# Patient Record
Sex: Female | Born: 1960 | Race: Black or African American | Hispanic: No | Smoking: Former smoker
Health system: Southern US, Community
[De-identification: ages and names within clinical notes are randomized; demographics above are authoritative.]

## PROBLEM LIST (undated history)

## (undated) DIAGNOSIS — I10 Essential (primary) hypertension: Secondary | ICD-10-CM

## (undated) DIAGNOSIS — K219 Gastro-esophageal reflux disease without esophagitis: Secondary | ICD-10-CM

## (undated) DIAGNOSIS — I509 Heart failure, unspecified: Secondary | ICD-10-CM

## (undated) DIAGNOSIS — G4730 Sleep apnea, unspecified: Secondary | ICD-10-CM

## (undated) DIAGNOSIS — F329 Major depressive disorder, single episode, unspecified: Secondary | ICD-10-CM

## (undated) DIAGNOSIS — J45909 Unspecified asthma, uncomplicated: Secondary | ICD-10-CM

## (undated) DIAGNOSIS — I890 Lymphedema, not elsewhere classified: Secondary | ICD-10-CM

## (undated) DIAGNOSIS — F32A Depression, unspecified: Secondary | ICD-10-CM

## (undated) HISTORY — DX: Unspecified asthma, uncomplicated: J45.909

## (undated) HISTORY — DX: Heart failure, unspecified: I50.9

---

## 2004-12-12 ENCOUNTER — Emergency Department: Payer: Self-pay | Admitting: Emergency Medicine

## 2004-12-13 ENCOUNTER — Other Ambulatory Visit: Payer: Self-pay

## 2004-12-14 ENCOUNTER — Emergency Department: Payer: Self-pay | Admitting: General Practice

## 2004-12-20 ENCOUNTER — Emergency Department (HOSPITAL_COMMUNITY): Admission: EM | Admit: 2004-12-20 | Discharge: 2004-12-20 | Payer: Self-pay | Admitting: Emergency Medicine

## 2008-06-14 ENCOUNTER — Inpatient Hospital Stay: Payer: Self-pay | Admitting: Internal Medicine

## 2011-08-20 ENCOUNTER — Inpatient Hospital Stay: Payer: Self-pay | Admitting: Internal Medicine

## 2013-02-12 ENCOUNTER — Inpatient Hospital Stay: Payer: Self-pay | Admitting: Internal Medicine

## 2013-02-12 LAB — COMPREHENSIVE METABOLIC PANEL
Albumin: 2.4 g/dL — ABNORMAL LOW (ref 3.4–5.0)
BUN: 8 mg/dL (ref 7–18)
Calcium, Total: 8.2 mg/dL — ABNORMAL LOW (ref 8.5–10.1)
Co2: 26 mmol/L (ref 21–32)
Creatinine: 0.7 mg/dL (ref 0.60–1.30)
EGFR (African American): >60
EGFR (Non-African Amer.): >60
Glucose: 109 mg/dL — ABNORMAL HIGH (ref 65–99)
Osmolality: 269 (ref 275–301)
Potassium: 4 mmol/L (ref 3.5–5.1)
SGOT(AST): 18 U/L (ref 15–37)
Sodium: 135 mmol/L — ABNORMAL LOW (ref 136–145)
Total Protein: 7.5 g/dL (ref 6.4–8.2)

## 2013-02-12 LAB — CBC
HCT: 35.2 % (ref 35.0–47.0)
HGB: 11.7 g/dL — ABNORMAL LOW (ref 12.0–16.0)
MCH: 33.1 pg (ref 26.0–34.0)
MCV: 100 fL (ref 80–100)
Platelet: 241 10*3/uL (ref 150–440)
RBC: 3.53 10*6/uL — ABNORMAL LOW (ref 3.80–5.20)
RDW: 15 % — ABNORMAL HIGH (ref 11.5–14.5)
WBC: 15.5 10*3/uL — ABNORMAL HIGH (ref 3.6–11.0)

## 2013-02-12 LAB — URINALYSIS, COMPLETE
Bilirubin,UR: NEGATIVE
Blood: NEGATIVE
Ketone: NEGATIVE
Ph: 7 (ref 4.5–8.0)
Protein: NEGATIVE
Squamous Epithelial: 3
WBC UR: 38 /HPF (ref 0–5)

## 2013-02-12 LAB — PRO B NATRIURETIC PEPTIDE: B-Type Natriuretic Peptide: 254 pg/mL — ABNORMAL HIGH (ref 0–125)

## 2013-02-12 LAB — TROPONIN I: Troponin-I: <0.02 ng/mL

## 2013-02-13 LAB — BASIC METABOLIC PANEL
Anion Gap: 7 (ref 7–16)
BUN: 9 mg/dL (ref 7–18)
Calcium, Total: 8.4 mg/dL — ABNORMAL LOW (ref 8.5–10.1)
EGFR (African American): >60
EGFR (Non-African Amer.): >60
Glucose: 100 mg/dL — ABNORMAL HIGH (ref 65–99)
Osmolality: 271 (ref 275–301)
Potassium: 3.6 mmol/L (ref 3.5–5.1)

## 2013-02-13 LAB — CBC WITH DIFFERENTIAL/PLATELET
Basophil #: 0.1 10*3/uL (ref 0.0–0.1)
Eosinophil %: 1.4 %
Lymphocyte %: 15.4 %
MCHC: 33.8 g/dL (ref 32.0–36.0)
Monocyte #: 1.4 x10 3/mm — ABNORMAL HIGH (ref 0.2–0.9)
Neutrophil #: 8 10*3/uL — ABNORMAL HIGH (ref 1.4–6.5)
Neutrophil %: 70.4 %
RBC: 3.36 10*6/uL — ABNORMAL LOW (ref 3.80–5.20)
RDW: 15.1 % — ABNORMAL HIGH (ref 11.5–14.5)
WBC: 11.4 10*3/uL — ABNORMAL HIGH (ref 3.6–11.0)

## 2013-02-13 LAB — CK-MB
CK-MB: <0.5 ng/mL — ABNORMAL LOW (ref 0.5–3.6)
CK-MB: <0.5 ng/mL — ABNORMAL LOW (ref 0.5–3.6)

## 2013-02-13 LAB — TROPONIN I: Troponin-I: <0.02 ng/mL

## 2013-02-14 LAB — VANCOMYCIN, TROUGH: Vancomycin, Trough: 10 ug/mL (ref 10–20)

## 2013-02-14 LAB — URINE CULTURE

## 2013-02-16 LAB — CREATININE, SERUM
Creatinine: 1.01 mg/dL (ref 0.60–1.30)
EGFR (Non-African Amer.): >60

## 2013-02-17 LAB — CULTURE, BLOOD (SINGLE)

## 2013-02-17 LAB — CREATININE, SERUM
EGFR (African American): >60
EGFR (Non-African Amer.): >60

## 2013-03-02 ENCOUNTER — Encounter: Payer: Self-pay | Admitting: Cardiothoracic Surgery

## 2013-03-02 ENCOUNTER — Encounter: Payer: Self-pay | Admitting: Nurse Practitioner

## 2013-03-22 ENCOUNTER — Encounter: Payer: Self-pay | Admitting: Cardiothoracic Surgery

## 2013-03-22 ENCOUNTER — Encounter: Payer: Self-pay | Admitting: Nurse Practitioner

## 2013-04-21 ENCOUNTER — Encounter: Payer: Self-pay | Admitting: Cardiothoracic Surgery

## 2013-04-21 ENCOUNTER — Encounter: Payer: Self-pay | Admitting: Nurse Practitioner

## 2014-04-15 IMAGING — CR DG CHEST 1V PORT
1 series · 1 of 1 positions shown · non-contrast
Comparison: none

REASON FOR EXAM: sepsis
COMMENTS:

[ap]
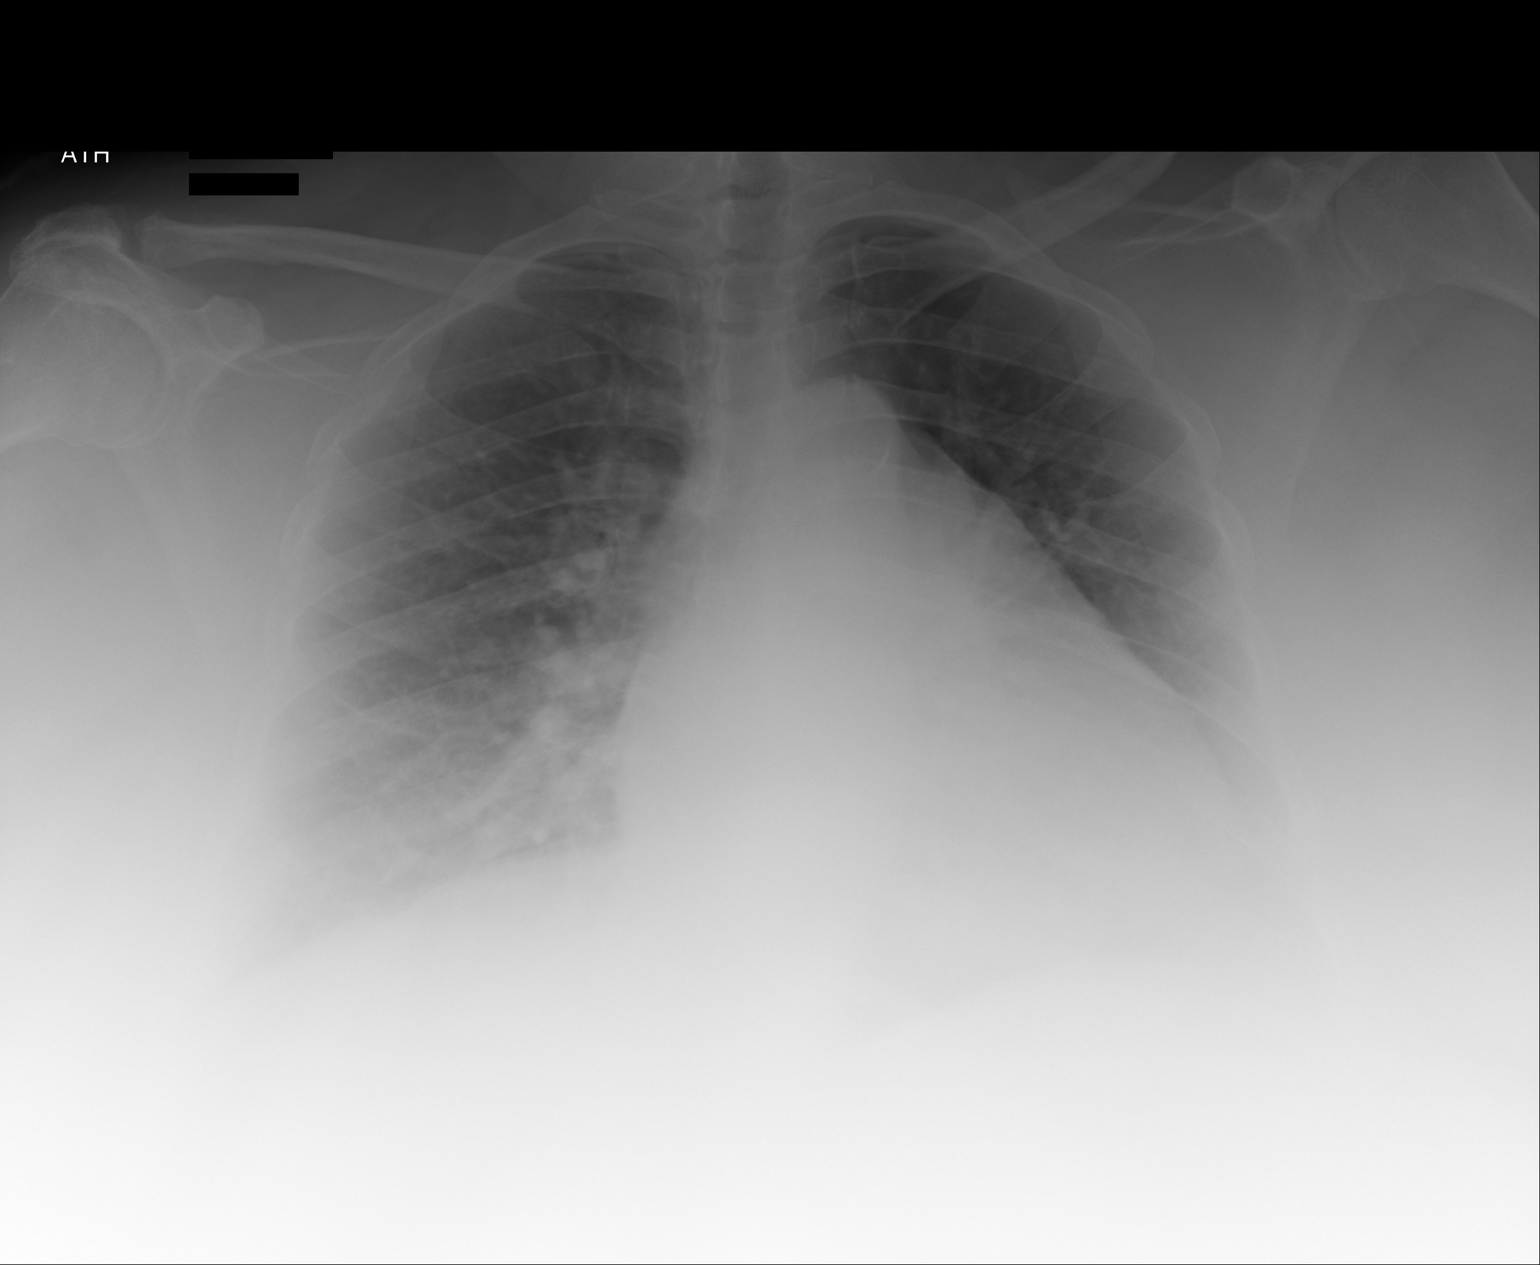

[1 of 1 positions shown; findings below may reference images not displayed]

PROCEDURE:     DXR - DXR PORTABLE CHEST SINGLE VIEW  - February 12, 2013 [DATE]

RESULT:     Comparison is made to the study August 20, 2011.

The lungs are reasonably well inflated. The cardiac silhouette is enlarged.
The interstitial markings are mildly increased and the central pulmonary
vascularity is prominent. Overlying soft tissues limit the diagnostic
quality of the study.
IMPRESSION: The findings suggest low-grade CHF. No focal pneumonia is
demonstrated. A followup PA and lateral chest x-ray would be of value when
the patient can tolerate the procedure.

[REDACTED]

## 2014-04-18 IMAGING — CR DG CHEST 1V
1 series · 1 of 1 positions shown · non-contrast
Comparison: none

REASON FOR EXAM: sob
COMMENTS:

PROCEDURE:     DXR - DXR CHEST 1 VIEWAP OR PA  - February 15, 2013  [DATE]
RESULT:     Comparison is made to the study February 12, 2013.
The lungs are reasonably well inflated. The cardiac silhouette is enlarged.
The pulmonary vascularity is prominent centrally. There is no pleural
effusion.

[x chest ap]
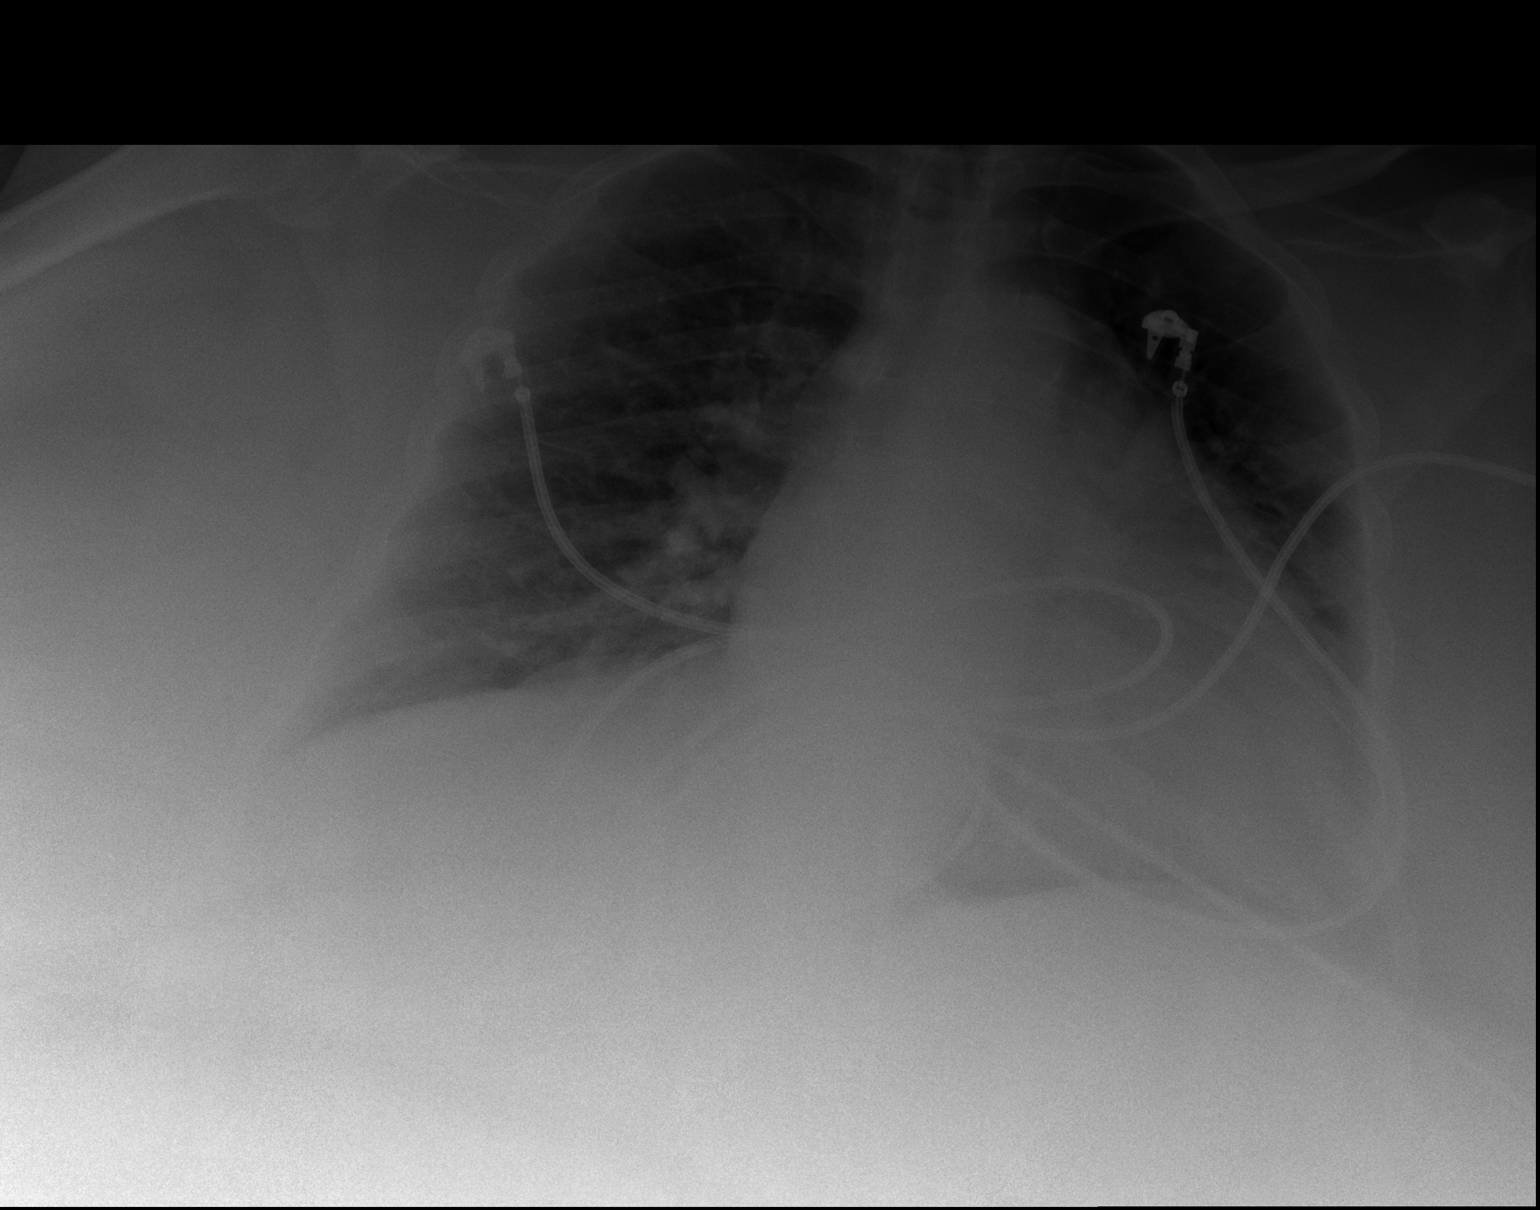

[1 of 1 positions shown; findings below may reference images not displayed]

IMPRESSION: The findings are consistent with low-grade CHF. There is no
evidence of pneumonia.

[REDACTED]

## 2015-04-13 NOTE — H&P (Signed)
PATIENT NAME:  [name], HEINER MR#:  [id number] DATE OF BIRTH:  06/24/1961  DATE OF ADMISSION:  02/12/2013  PRIMARY CARE PHYSICIAN: At [facility].   HISTORY OF PRESENT ILLNESS: The patient is a 54 year old African American female morbidly obese with history of cellulitis in the lower extremity, has severe lymphedema involving both legs, has hypertension, obstructive sleep apnea, which she is not using a CPAP machine for the past few years, who presents with complaint of having redness involving her left leg knee below. She also noticed some worsening swelling of the leg as well. She has had chills and has felt feverish. In the ED, she was noted to have erythema and warmth, consistent with cellulitis. The patient also reports that she has been having some left-sided sharp pain, which started yesterday. She also has had shortness of breath on and off, but for the past few days, she has been more short of breath. She otherwise denies any abdominal pain, nausea, vomiting or diarrhea. Denies any urinary frequency, urgency or hesitancy.   PAST MEDICAL HISTORY: Significant for:  1.  History of admission in 2009 for cellulitis.  2.  History of having acute renal failure secondary to Ancef.  3.  History of admission in August 2012 for pulmonary edema. At that time, echo was 55%, thought to have diastolic CHF.  4.  History of anemia with elevated MCV.  5.  Morbid obesity.  6.  Hypertension.  7.  Chronic lymphedema.  8.  Obstructive sleep apnea, not using CPAP for the past few years.  9.  History of panic attacks.   HOME MEDICATIONS: 1.  50 one tab p.o. b.i.d.  2.  Catapres-TTS-2 patch daily.  3.  Chlorthalidone 25 p.o. daily.  4.  Lasix 40 p.o. daily.  5.  Omeprazole; she says she is on 40 daily.   ALLERGIES: None.   PAST SURGICAL HISTORY: None.   SOCIAL HISTORY: Used to smoke. Reports that she is not smoking anymore. Used to drink heavy on weekends but does not drink anymore.   FAMILY  HISTORY: Significant for hypertension and CVA in her father, who passed away last year according to her.   REVIEW OF SYSTEMS:  CONSTITUTIONAL: Complains of fever, fatigue, weakness. No weight loss.  EYES: No blurred or double vision. No pain. No redness. No inflammation. No glaucoma. Wears glasses.  ENT: No tinnitus. No ear pain. No hearing loss. No seasonal or year-round allergies. No difficulty swallowing.  RESPIRATORY: Complains of shortness of breath. No cough or wheezing. No hemoptysis. No history of COPD.  CARDIOVASCULAR: Complains of left-sided sharp chest pain that started yesterday. Denies orthopnea. Complains of lower extremity edema. History of diastolic CHF in the past. Denies any palpitations or syncope.  GASTROINTESTINAL: Denies any nausea, vomiting, diarrhea. No abdominal pain. No hematemesis. No melena. No changes in bowel habits.  GENITOURINARY: Denies any dysuria, hematuria.  Denies any frequency or urgency ENDOCRINE: Denies any polyuria, nocturia or thyroid problems.  HEMATOLOGIC/LYMPHATIC: Has a history of anemia. No easy bruisability or bleeding.  SKIN: No acne. Has chronic lymphedema of the lower extremities.  MUSCULOSKELETAL: Denies any pain in the neck, back or shoulder.  NEUROLOGIC: No numbness. No CVA. No TIA. No seizures.  PSYCHIATRIC: No anxiety. No insomnia. No ADD. No OCD.   PHYSICAL EXAMINATION: VITAL SIGNS: Temperature 100.2, pulse 80, respirations 22, blood pressure 154/113.  Subsequent check was 168/65. O2 100% on room air.  GENERAL: The patient is a morbidly obese African American female currently not  in any acute distress.  HEENT: Head atraumatic, normocephalic. Pupils equally round and reactive to light and accommodation. There is no conjunctival pallor. No scleral icterus. Nasal exam shows no drainage or ulceration. Oropharynx is clear without any exudates.  NECK: There is no thyromegaly. No carotid bruits.  CARDIOVASCULAR: Regular rate and rhythm. No  murmurs, rubs, clicks or gallops. PMI is not displaced.  LUNGS: Clear to auscultation bilaterally without any rales, rhonchi, wheezing.  ABDOMEN: Obese, nontender, nondistended. Positive bowel sounds x4. No hepatosplenomegaly.  EXTREMITIES: She has got chronic lymphedema involving both lower extremities. There is warmth and erythema involving the left leg knee down.  SKIN: Beside erythematous changes and chronic lymphedema changes in the lower extremity, no other rashes identified.  LYMPHATICS: No lymph nodes palpable.  VASCULAR: Hard to appreciate pulses in the lower extremity due to her weight, but there are DP and PT pulses.  PSYCHIATRIC: Not anxious or depressed.  NEUROLOGICAL: Awake, alert, oriented x3. No focal deficits.   EVALUATION IN THE EMERGENCY DEPARTMENT: BMP: Glucose was 109, BUN 8, creatinine was 0.70, sodium 135, potassium 4.0, chloride 103, CO2 was 26, calcium 8.2, total protein 7.5, albumin of 2.4, bili total 0.7, alk phos was 82, AST 18, troponin less than 0.02. WBC count is 15.5, hemoglobin 11.7, platelet count 241. Lactic acid 0.5. Chest x-ray shows findings suggestive of low-grade CHF. No focal pneumonia is demonstrated.   ASSESSMENT AND PLAN: The patient is a 54 year old morbidly obese female, who presents with left leg swelling and erythema for the past few days. Also has reproducible chest pain and shortness of breath.  1. Left leg cellulitis. At this time, we will treat her with IV vancomycin and Unasyn, and blood cultures have been already ordered.  2.  Chest pain. We will check cardiac enzymes and EKG. Her chest x-ray suggests low-grade congestive heart failure. We will place her on aspirin. We will check a D-dimer; however, this is likely going to be elevated due to her cellulitis and her chronic lymphedema, which will not help [name]. Her pain is reproducible. Due to her morbid obesity, we will check a lower extremity Doppler to make sure she does not have a deep venous  thrombosis. I will hold off on full anticoagulation for the time being.  3.  Hypertension. Continue atenolol and Catapres patch. I will put her on hydralazine p.r.n.  4.  Chronic lymphedema. She will be on IV Lasix due to possible acute diastolic CHF.  5.  Shortness of breath, likely due to acute diastolic CHF. We will place her on IV Lasix. She had an echo done last year, so we will not repeat that.  6.  Miscellaneous. We will place her on Lovenox for DVT prophylaxis.   TIME SPENT: Forty-five minutes.    ____________________________ Lafonda Mosses Posey Pronto, MD shp:lg D: 02/12/2013 12:47:38 ET T: 02/12/2013 15:51:15 ET JOB#: [id number]  cc: Garcia Dalzell H. Posey Pronto, MD, <Dictator> Alric Seton MD ELECTRONICALLY SIGNED 02/14/2013 12:26

## 2015-04-13 NOTE — Discharge Summary (Signed)
PATIENT NAME:  [name], [name] MR#:  [id number] DATE OF BIRTH:  Apr 26, 1961  DATE OF ADMISSION:  02/12/2013 DATE OF DISCHARGE:  02/17/2013  PRIMARY CARE PHYSICIAN: Gildardo Cranker, MD   DISCHARGE DIAGNOSES: 1.  Left leg cellulitis. 2.  Chronic lymphedema of the legs. 3.  Hypertension. 4.  Morbid obesity.  5.  Sleep apnea.  6.  History of panic attacks.   DISCHARGE MEDICATIONS: 1.  Catapres patch 0.1 mg for 24 hours 1 patch every week.  2.  Chlorthalidone 25 mg daily.  3.  Lasix 40 mg daily. 4.  Omeprazole 40 mg daily. 5.  Percocet 5/325 mg every 4 to 6 hours as needed for pain.  6.  Atenolol 50 mg p.o. b.i.d.  7.  AmLactin, which is ammonium lactate, topical cream apply to the left leg twice daily.  8.  Bactrim DS 1 tablet p.o. b.i.d. for 2 weeks.   CONSULTATIONS: Wound Care.  HOSPITAL COURSE:  1. The patient is a 54 year old morbidly obese female with chronic lymphedema and hypertension admitted because of worsening swelling and redness and pain in the left leg. The patient felt chills and feverish.  The patient admitted for leg cellulitis, started on vancomycin. Temperature was 100.2 on admission. She also had chest pressure when she came in. EKG was normal. Troponins have been negative. The patient's blood cultures were negative. She improved with vancomycin. She still has some pain and redness, but better than when she came. She was seen by wound care people, recommended AmLactin cream for her legs. She will be going home with Bactrim 1 tablet daily for 2 weeks and follow up with wound care people in 1 week. She also will follow up with her primary doctor at [facility].  2. Hypertension. The patient was started on Lasix that she was taking at home along with clonidine patch and Lasix dose increased, initially 40 mg IV every 12 hours, to help with her leg swelling. The patient complained of staying up all night with increased urine output. Then Lasix changed.  Right now she is on Lasix 40  mg IV daily that she can continue for 40 mg p.o. daily.  3.  Elevated d-dimer. D-dimer elevated up to 6.  The patient could not fit in CAT scan of the chest because of her weight. V/Q scan was done which showed low probability for PE. The patient initially continued on Lovenox, full dose, but that is stopped now. The patient has CPAP machine at home. She said she is going to have her primary doctor arrange home health. She denies any home health needs here. Blood pressure today is 117/76, pulse 75, respirations 18 and  temperature 98.4. 4.  UTI. Urine culture showed E. coli from admission that is sensitive to cefazolin and ceftriaxone, resistant to Cipro and sensitive to Bactrim. So initially IV vancomycin was given, but now I changed to Bactrim, which is sensitive for urine infection, also leg cellulitis. The patient also had ultrasound of the leg which did not show any occlusive thrombus. The patient's white count on admission was elevated at 15.5, but it came down to 11.4 on 23rd of February.  She is afebrile since admission.   CONDITION ON DISCHARGE: Stable. She will follow up with her primary doctor in 1 to 2 weeks.   TIME SPENT ON DISCHARGE PREPARATION: More than 30 minutes.  ____________________________ Katha Hamming, MD sk:sb D: 02/17/2013 08:45:25 ET T: 02/17/2013 09:18:24 ET JOB#: [id number]  cc: Katha Hamming, MD, <Dictator> Amy  Tyler DeisWheeler, MD [facility] Katha HammingSNEHALATHA Nakima Fluegge MD ELECTRONICALLY SIGNED 02/23/2013 14:48

## 2015-11-01 ENCOUNTER — Emergency Department: Payer: Medicaid Other

## 2015-11-01 ENCOUNTER — Inpatient Hospital Stay
Admission: EM | Admit: 2015-11-01 | Discharge: 2015-11-06 | DRG: 602 | Disposition: A | Discharge status: Home Health | Payer: Medicaid Other | Attending: Internal Medicine | Admitting: Internal Medicine

## 2015-11-01 ENCOUNTER — Encounter: Payer: Self-pay | Admitting: *Deleted

## 2015-11-01 DIAGNOSIS — N61 Mastitis without abscess: Secondary | ICD-10-CM | POA: Diagnosis present

## 2015-11-01 DIAGNOSIS — Z79899 Other long term (current) drug therapy: Secondary | ICD-10-CM

## 2015-11-01 DIAGNOSIS — Z823 Family history of stroke: Secondary | ICD-10-CM

## 2015-11-01 DIAGNOSIS — N3000 Acute cystitis without hematuria: Secondary | ICD-10-CM | POA: Diagnosis present

## 2015-11-01 DIAGNOSIS — L0390 Cellulitis, unspecified: Secondary | ICD-10-CM | POA: Diagnosis present

## 2015-11-01 DIAGNOSIS — G4730 Sleep apnea, unspecified: Secondary | ICD-10-CM | POA: Diagnosis present

## 2015-11-01 DIAGNOSIS — K219 Gastro-esophageal reflux disease without esophagitis: Secondary | ICD-10-CM | POA: Diagnosis present

## 2015-11-01 DIAGNOSIS — J9601 Acute respiratory failure with hypoxia: Secondary | ICD-10-CM | POA: Diagnosis present

## 2015-11-01 DIAGNOSIS — L03313 Cellulitis of chest wall: Secondary | ICD-10-CM

## 2015-11-01 DIAGNOSIS — I110 Hypertensive heart disease with heart failure: Secondary | ICD-10-CM | POA: Diagnosis present

## 2015-11-01 DIAGNOSIS — Z8249 Family history of ischemic heart disease and other diseases of the circulatory system: Secondary | ICD-10-CM

## 2015-11-01 DIAGNOSIS — B9620 Unspecified Escherichia coli [E. coli] as the cause of diseases classified elsewhere: Secondary | ICD-10-CM | POA: Diagnosis present

## 2015-11-01 DIAGNOSIS — I5031 Acute diastolic (congestive) heart failure: Secondary | ICD-10-CM | POA: Diagnosis present

## 2015-11-01 DIAGNOSIS — L03114 Cellulitis of left upper limb: Principal | ICD-10-CM | POA: Diagnosis present

## 2015-11-01 DIAGNOSIS — J984 Other disorders of lung: Secondary | ICD-10-CM | POA: Diagnosis present

## 2015-11-01 DIAGNOSIS — B372 Candidiasis of skin and nail: Secondary | ICD-10-CM | POA: Diagnosis present

## 2015-11-01 DIAGNOSIS — Z6841 Body Mass Index (BMI) 40.0 and over, adult: Secondary | ICD-10-CM

## 2015-11-01 DIAGNOSIS — I5033 Acute on chronic diastolic (congestive) heart failure: Secondary | ICD-10-CM

## 2015-11-01 HISTORY — DX: Lymphedema, not elsewhere classified: I89.0

## 2015-11-01 HISTORY — DX: Sleep apnea, unspecified: G47.30

## 2015-11-01 HISTORY — DX: Major depressive disorder, single episode, unspecified: F32.9

## 2015-11-01 HISTORY — DX: Essential (primary) hypertension: I10

## 2015-11-01 HISTORY — DX: Morbid (severe) obesity due to excess calories: E66.01

## 2015-11-01 HISTORY — DX: Gastro-esophageal reflux disease without esophagitis: K21.9

## 2015-11-01 HISTORY — DX: Depression, unspecified: F32.A

## 2015-11-01 LAB — LACTIC ACID, PLASMA
LACTIC ACID, VENOUS: 1.2 mmol/L (ref 0.5–2.0)
LACTIC ACID, VENOUS: 0.8 mmol/L (ref 0.5–2.0)

## 2015-11-01 LAB — URINALYSIS COMPLETE WITH MICROSCOPIC (ARMC ONLY)
BILIRUBIN URINE: NEGATIVE
GLUCOSE, UA: NEGATIVE mg/dL
Hgb urine dipstick: NEGATIVE
KETONES UR: NEGATIVE mg/dL
NITRITE: POSITIVE — AB
Protein, ur: 30 mg/dL — AB
SPECIFIC GRAVITY, URINE: 1.021 (ref 1.005–1.030)
pH: 6 (ref 5.0–8.0)

## 2015-11-01 LAB — CBC
HCT: 45.4 % (ref 35.0–47.0)
Hemoglobin: 14.7 g/dL (ref 12.0–16.0)
MCH: 36.7 pg — ABNORMAL HIGH (ref 26.0–34.0)
MCHC: 32.4 g/dL (ref 32.0–36.0)
MCV: 113.2 fL — ABNORMAL HIGH (ref 80.0–100.0)
PLATELETS: 210 10*3/uL (ref 150–440)
RBC: 4.01 MIL/uL (ref 3.80–5.20)
RDW: 16.3 % — ABNORMAL HIGH (ref 11.5–14.5)
WBC: 6.2 10*3/uL (ref 3.6–11.0)

## 2015-11-01 LAB — BASIC METABOLIC PANEL
ANION GAP: 8 (ref 5–15)
BUN: 8 mg/dL (ref 6–20)
CALCIUM: 8.8 mg/dL — AB (ref 8.9–10.3)
CO2: 33 mmol/L — ABNORMAL HIGH (ref 22–32)
CREATININE: 0.59 mg/dL (ref 0.44–1.00)
Chloride: 98 mmol/L — ABNORMAL LOW (ref 101–111)
Glucose, Bld: 107 mg/dL — ABNORMAL HIGH (ref 65–99)
Potassium: 4 mmol/L (ref 3.5–5.1)
SODIUM: 139 mmol/L (ref 135–145)

## 2015-11-01 MED ORDER — ENOXAPARIN SODIUM 40 MG/0.4ML ~~LOC~~ SOLN
40.0000 mg | Freq: Two times a day (BID) | SUBCUTANEOUS | Status: DC
  Administered 2015-11-01 – 2015-11-06 (×11): 40 mg via SUBCUTANEOUS
  Filled 2015-11-01 (×12): qty 0.4

## 2015-11-01 MED ORDER — NYSTATIN-TRIAMCINOLONE 100000-0.1 UNIT/GM-% EX OINT
TOPICAL_OINTMENT | Freq: Two times a day (BID) | CUTANEOUS | Status: DC
  Administered 2015-11-01 – 2015-11-06 (×11): via TOPICAL
  Filled 2015-11-01 (×2): qty 15

## 2015-11-01 MED ORDER — PIPERACILLIN-TAZOBACTAM 4.5 G IVPB
4.5000 g | Freq: Three times a day (TID) | INTRAVENOUS | Status: DC
  Filled 2015-11-01: qty 100

## 2015-11-01 MED ORDER — DOCUSATE SODIUM 100 MG PO CAPS
100.0000 mg | ORAL_CAPSULE | Freq: Two times a day (BID) | ORAL | Status: DC
  Administered 2015-11-01 – 2015-11-03 (×3): 100 mg via ORAL
  Filled 2015-11-01 (×10): qty 1

## 2015-11-01 MED ORDER — ACETAMINOPHEN 325 MG PO TABS: 650.0000 mg | ORAL_TABLET | Freq: Four times a day (QID) | ORAL | Status: DC | PRN

## 2015-11-01 MED ORDER — SODIUM CHLORIDE 0.9 % IV SOLN
1750.0000 mg | Freq: Two times a day (BID) | INTRAVENOUS | Status: DC
  Administered 2015-11-02 – 2015-11-03 (×3): 1750 mg via INTRAVENOUS
  Filled 2015-11-01 (×4): qty 1750

## 2015-11-01 MED ORDER — CHLORTHALIDONE 25 MG PO TABS
25.0000 mg | ORAL_TABLET | Freq: Every day | ORAL | Status: DC
  Administered 2015-11-01 – 2015-11-05 (×5): 25 mg via ORAL
  Filled 2015-11-01 (×5): qty 1

## 2015-11-01 MED ORDER — LISINOPRIL 10 MG PO TABS
10.0000 mg | ORAL_TABLET | Freq: Every day | ORAL | Status: DC
  Administered 2015-11-01 – 2015-11-05 (×5): 10 mg via ORAL
  Filled 2015-11-01 (×5): qty 1

## 2015-11-01 MED ORDER — ACETAMINOPHEN 650 MG RE SUPP: 650.0000 mg | Freq: Four times a day (QID) | RECTAL | Status: DC | PRN

## 2015-11-01 MED ORDER — INFLUENZA VAC SPLIT QUAD 0.5 ML IM SUSY
0.5000 mL | PREFILLED_SYRINGE | INTRAMUSCULAR | Status: AC
  Administered 2015-11-02: 0.5 mL via INTRAMUSCULAR
  Filled 2015-11-01: qty 0.5

## 2015-11-01 MED ORDER — PIPERACILLIN-TAZOBACTAM 3.375 G IVPB
3.3750 g | Freq: Once | INTRAVENOUS | Status: AC
  Administered 2015-11-01: 3.375 g via INTRAVENOUS
  Filled 2015-11-01: qty 50

## 2015-11-01 MED ORDER — SODIUM CHLORIDE 0.9 % IV SOLN
1750.0000 mg | INTRAVENOUS | Status: AC
  Administered 2015-11-01: 1750 mg via INTRAVENOUS
  Filled 2015-11-01: qty 1750

## 2015-11-01 MED ORDER — POLYETHYLENE GLYCOL 3350 17 G PO PACK
17.0000 g | PACK | Freq: Every day | ORAL | Status: DC | PRN
  Administered 2015-11-03 – 2015-11-05 (×2): 17 g via ORAL
  Filled 2015-11-01 (×2): qty 1

## 2015-11-01 MED ORDER — ATENOLOL 50 MG PO TABS
50.0000 mg | ORAL_TABLET | Freq: Two times a day (BID) | ORAL | Status: DC
  Administered 2015-11-01 – 2015-11-06 (×11): 50 mg via ORAL
  Filled 2015-11-01 (×11): qty 1

## 2015-11-01 MED ORDER — PIPERACILLIN-TAZOBACTAM 4.5 G IVPB
4.5000 g | Freq: Three times a day (TID) | INTRAVENOUS | Status: DC
  Administered 2015-11-01 – 2015-11-03 (×6): 4.5 g via INTRAVENOUS
  Filled 2015-11-01 (×7): qty 100

## 2015-11-01 MED ORDER — FUROSEMIDE 40 MG PO TABS
40.0000 mg | ORAL_TABLET | Freq: Two times a day (BID) | ORAL | Status: DC
  Administered 2015-11-01 – 2015-11-06 (×10): 40 mg via ORAL
  Filled 2015-11-01 (×10): qty 1

## 2015-11-01 MED ORDER — CLONIDINE HCL 0.2 MG/24HR TD PTWK
0.2000 mg | MEDICATED_PATCH | TRANSDERMAL | Status: DC
  Administered 2015-11-01: 0.2 mg via TRANSDERMAL
  Filled 2015-11-01: qty 1

## 2015-11-01 NOTE — Evaluation (Deleted)
Physical Therapy Evaluation Patient Details Name: [name] MRN: [id number] DOB: [birth year] Today's Date: 11/01/2015   History of Present Illness  Pt is a 54 yo female who was admitted to the hospital with UTI and cellulitis to the L breast and arm.  Clinical Impression  Pt presents with hx of HTN, morbid obesity, GERD, depression, and sleep apnea. Examination that pt performs bed mobility at mod A, transfers at min A, and is not able to complete ambulation secondary to pain. Her bed mobility requires the most A by far. Pt appears labored with breathing during all mobility, but her SaO2 never falls below 98%. Secondary to her decreased mobility and decreased functional strength she will continue to benefit from skilled PT in order to return to premorbid state. At this point pt is well removed from baseline. She is fairly pleasant and willing to work with therapy.     Follow Up Recommendations SNF    Equipment Recommendations   (Bariatric bedside commode )    Recommendations for Other Services       Precautions / Restrictions Precautions Precautions: Fall Restrictions Weight Bearing Restrictions: No      Mobility  Bed Mobility Overal bed mobility: Needs Assistance Bed Mobility: Supine to Sit;Sit to Supine     Supine to sit: Mod assist;+2 for physical assistance Sit to supine: Mod assist;+2 for physical assistance   General bed mobility comments: Pt needs trunk and LE support for general bed mobility. Needs assist to get back to [facility]. Pt at risk for rolling off of bed  Transfers Overall transfer level: Needs assistance Equipment used:  (BRW) Transfers: Sit to/from Stand Sit to Stand: Min assist         General transfer comment: Pt with good functional strength but still requiring min assist to get into standing. Pt states that she feels pretty good in standing with no LOB  Ambulation/Gait Ambulation/Gait assistance:  (Not appropriate this date secondary to  pain.)              Stairs            Wheelchair Mobility    Modified Rankin (Stroke Patients Only)       Balance Overall balance assessment: No apparent balance deficits (not formally assessed)                                           Pertinent Vitals/Pain Pain Assessment: No/denies pain    Home Living Family/patient expects to be discharged to:: Private residence Living Arrangements: Alone Available Help at Discharge: Family;Available PRN/intermittently Type of Home: Apartment Home Access: Level entry     Home Layout: One level Home Equipment:  (BRW)      Prior Function Level of Independence: Independent with assistive device(s)         Comments: Pt was indep with BRW and mostly household ambulation. Family members do her shopping for her     Hand Dominance        Extremity/Trunk Assessment   Upper Extremity Assessment: Overall WFL for tasks assessed           Lower Extremity Assessment: Overall WFL for tasks assessed         Communication   Communication: No difficulties  Cognition Arousal/Alertness: Awake/alert Behavior During Therapy: WFL for tasks assessed/performed Overall Cognitive Status: Within Functional Limits for tasks assessed  General Comments      Exercises Other Exercises Other Exercises: Pt was taught and performed bilateral therex x 10 reps at supervision for proper technique. Exercises included: ankle pumps, quad sets, hip abd, and arm reach overhead. Exercises frequency and volume placed on board for pt      Assessment/Plan    PT Assessment Patient needs continued PT services  PT Diagnosis Difficulty walking;Acute pain   PT Problem List Decreased range of motion;Decreased activity tolerance;Decreased mobility;Decreased knowledge of use of DME;Obesity;Pain  PT Treatment Interventions DME instruction;Gait training;Stair training;Functional mobility  training;Therapeutic activities;Balance training;Therapeutic exercise;Neuromuscular re-education;Cognitive remediation   PT Goals (Current goals can be found in the Care Plan section) Acute Rehab PT Goals Patient Stated Goal: to return home PT Goal Formulation: With patient Time For Goal Achievement: 11/15/15 Potential to Achieve Goals: Fair    Frequency Min 2X/week   Barriers to discharge Decreased caregiver support      Co-evaluation               End of Session Equipment Utilized During Treatment: Gait belt (2 gait belts required) Activity Tolerance: Patient tolerated treatment well;Patient limited by pain Patient left: in bed;with call bell/phone within reach;with bed alarm set Nurse Communication: Mobility status         Time: 1429-1500 PT Time Calculation (min) (ACUTE ONLY): 31 min   Charges:         PT G CodesBenna Dunks:        [name] 11/01/2015, 4:23 PM Benna Dunksasey Tranika Scholler, SPT. [phone]

## 2015-11-01 NOTE — H&P (Signed)
[facility] - [place] at [facility]   PATIENT NAME: [name]    MR#:  [id number]  DATE OF BIRTH:  03-25-1961  DATE OF ADMISSION:  11/01/2015  PRIMARY CARE PHYSICIAN: No primary care provider on file.   REQUESTING/REFERRING PHYSICIAN: Dr. Lacretia Nicks  CHIEF COMPLAINT:   Chief Complaint  Patient presents with  . Fatigue    HISTORY OF PRESENT ILLNESS:  [name]  is a 54 y.o. female with a known history of morbid obesity, gastroesophageal reflux disease, hypertension depression and chronic bilateral lower extremity edema presents to hospital secondary to generalized weakness, worsening left breast swelling, tenderness and erythema. Patient is supposed to be on Lasix 80 mg daily and also chlorthalidone for her chronic edema, however hasn't been taking her medication from last month. She says, it is getting hard for her to get to bathroom in time due to taking her Diuretics. She has been working with her primary care physician to get herself a bedside commode. Until then she wanted to stop those medications. She noticed that the swelling in her legs and also her left arm and left breast have worsened, or the last couple of days she has increased redness and tenderness noted as well. Denies any fevers or chills. She had upper respiratory tract infection almost 6 weeks ago, since then her cough has become less frequent but her weakness has persisted. No nausea or vomiting. Has noticed some pain and irritation when voiding. Labs indicate significant urinary tract infection, and also on exam she has cellulitis of the chest wall, left breast and left arm.  PAST MEDICAL HISTORY:   Past Medical History  Diagnosis Date  . Hypertension   . Sleep apnea   . Morbid obesity (HCC)   . GERD (gastroesophageal reflux disease)   . Depression   . Lymphedema     PAST SURGICAL HISTORY:  History reviewed. No pertinent past surgical history.  SOCIAL HISTORY:   Social  History  Substance Use Topics  . Smoking status: Never Smoker   . Smokeless tobacco: Not on file  . Alcohol Use: 0.0 oz/week    0 Standard drinks or equivalent per week     Comment: occasional alcohol use    FAMILY HISTORY:   Family History  Problem Relation Age of Onset  . CVA Father   . Hypertension Father   . Hypertension Mother     DRUG ALLERGIES:  No Known Allergies  REVIEW OF SYSTEMS:   Review of Systems  Constitutional: Negative for fever, chills and weight loss.  HENT: Negative for ear discharge, ear pain, nosebleeds and tinnitus.   Eyes: Negative for blurred vision, double vision and photophobia.       Uses glasses  Respiratory: Positive for cough. Negative for hemoptysis, shortness of breath and wheezing.   Cardiovascular: Positive for leg swelling. Negative for chest pain, palpitations and orthopnea.  Gastrointestinal: Negative for nausea, vomiting, abdominal pain, diarrhea, constipation and melena.  Genitourinary: Positive for dysuria and frequency. Negative for urgency and hematuria.  Musculoskeletal: Positive for myalgias, back pain and joint pain. Negative for neck pain.  Skin: Positive for itching and rash.  Neurological: Positive for weakness. Negative for dizziness, sensory change, speech change, focal weakness and headaches.  Endo/Heme/Allergies: Does not bruise/bleed easily.  Psychiatric/Behavioral: Negative for depression. The patient is not nervous/anxious.     MEDICATIONS AT HOME:   Prior to Admission medications   Medication Sig Start Date End Date Taking? Authorizing Provider  atenolol (TENORMIN) 50  MG tablet Take 50 mg by mouth 2 (two) times daily.   Yes Historical Provider, MD  cloNIDine (CATAPRES-TTS-2) 0.2 mg/24hr patch Place 0.2 mg onto the skin once a week. On Thursday   Yes Historical Provider, MD  ketoconazole (NIZORAL) 2 % cream Apply 1 application topically daily.   Yes Historical Provider, MD  lisinopril (PRINIVIL,ZESTRIL) 10 MG tablet  Take 10 mg by mouth daily.   Yes Historical Provider, MD      VITAL SIGNS:  Blood pressure 135/86, pulse 72, temperature 99.2 F (37.3 C), temperature source Oral, resp. rate 22, height 5\' 2"  (1.575 m), weight 208.655 kg (460 lb), SpO2 99 %.  PHYSICAL EXAMINATION:   Physical Exam  GENERAL:  54 y.o.-year-old morbidly obese patient sitting in the bed with no acute distress.  EYES: Pupils equal, round, reactive to light and accommodation. Uses glasses. No scleral icterus. Extraocular muscles intact.  HEENT: Head atraumatic, normocephalic. Oropharynx and nasopharynx clear.  NECK:  Supple, no jugular venous distention. No thyroid enlargement, no tenderness.  LUNGS: Normal breath sounds bilaterally, no wheezing, rales,rhonchi or crepitation. Scant breath sounds are audible due to body habitus. Decreased bibasilar breath sounds. No use of accessory muscles of respiration.  Breasts- enlarged and heavy left breast with significant subcutaneous edema. Underneath both breasts, patient has likely candidal infection. Some erythema and tenderness also spreads onto superior abdominal wall. CARDIOVASCULAR: S1, S2 normal. No murmurs, rubs, or gallops.  ABDOMEN: Very obese abdomen, Soft, nontender. Bowel sounds present. No organomegaly or mass.  EXTREMITIES: Chronic bilateral lymphedema, skin of left leg is more thickened and discolored due to previous history of cellulitis. Some cellulitis with redness also extends onto left arm which she is more swollen than the right one. No cyanosis, or clubbing.  NEUROLOGIC: Cranial nerves II through XII are intact. Muscle strength 4/5 in all extremities with generalized weakness. Sensation intact. Gait not checked.  PSYCHIATRIC: The patient is alert and oriented x 3.  SKIN: Thickened and discolored skin posterior  left leg , in no early left breast is warm and tender, also some erythema of left upper arm  LABORATORY PANEL:   CBC  Recent Labs Lab 11/01/15 0908   WBC 6.2  HGB 14.7  HCT 45.4  PLT 210   ------------------------------------------------------------------------------------------------------------------  Chemistries   Recent Labs Lab 11/01/15 0908  NA 139  K 4.0  CL 98*  CO2 33*  GLUCOSE 107*  BUN 8  CREATININE 0.59  CALCIUM 8.8*   ------------------------------------------------------------------------------------------------------------------  Cardiac Enzymes No results for input(s): TROPONINI in the last 168 hours. ------------------------------------------------------------------------------------------------------------------  RADIOLOGY:  Dg Chest 2 View  11/01/2015  CLINICAL DATA:  Intermittent cough and congestion. Left breast swelling and redness. EXAM: CHEST  2 VIEW COMPARISON:  February 15, 2013 FINDINGS: There is interstitial edema with cardiomegaly and pulmonary venous hypertension. No airspace consolidation. No adenopathy appreciable. No bone lesions. No visualized soft tissue lesions. IMPRESSION: Evidence of a degree of congestive heart failure. No airspace consolidation. Electronically Signed   By: Bretta BangWilliam  Woodruff III M.D.   On: 11/01/2015 08:54    EKG:   Orders placed or performed during the hospital encounter of 11/01/15  . EKG 12-Lead  . EKG 12-Lead  . ED EKG  . ED EKG    IMPRESSION AND PLAN:   Bailey ShiverLinda Huerta  is a 54 y.o. female with a known history of morbid obesity, gastroesophageal reflux disease, hypertension depression and chronic bilateral lower extremity edema presents to hospital secondary to generalized weakness, worsening left breast swelling,  tenderness and erythema.  #1 cellulitis of left breast and left arm-keep the left breast elevated, Lasix started to decrease the swelling. -Blood cultures are pending. -On broad-spectrum antibiotics with vancomycin and Zosyn at this time  #2 acute cystitis-urine and blood cultures ordered. -Patient is on broad-spectrum antibiotics anyways for her  cellulitis  #3 candidal infection of the skin-poor hygiene in general. -Started on nystatin ointment twice a day  #4 hypertension-well controlled. Continue atenolol, chlorthalidone, clonidine, Lasix and lisinopril  #5 chronic lower extremity edema-restart her Lasix and chlorthalidone here. -We will need a bedside commode at discharge  #6 DVT prophylaxis-started on Lovenox  Physical therapy consult requested    All the records are reviewed and case discussed with ED provider. Management plans discussed with the patient, family and they are in agreement.  CODE STATUS: Full Code  TOTAL TIME TAKING CARE OF THIS PATIENT: 50 minutes.    [name] M.D on 11/01/2015 at 11:53 AM  Between 7am to 6pm - Pager - [phone]  After 6pm go to [email or web address] - password [email or web address] [facility]  [facility] [place] [facility]  Office  [phone]  CC: Primary care physician; No primary care provider on file.

## 2015-11-01 NOTE — ED Notes (Signed)
Patient transported to X-ray 

## 2015-11-01 NOTE — Progress Notes (Addendum)
ANTIBIOTIC CONSULT NOTE - INITIAL  Pharmacy Consult for Zosyn/Vancomycin Indication: cellulitis  No Known Allergies  Patient Measurements: Height: 5\' 2"  (157.5 cm) Weight: (!) 460 lb (208.655 kg) IBW/kg (Calculated) : 50.1 Adjusted Body Weight: 113.5 kg   Vital Signs: Temp: 98.5 F (36.9 C) (11/10 1236) Temp Source: Oral (11/10 1236) BP: 155/78 mmHg (11/10 1236) Pulse Rate: 81 (11/10 1236) Intake/Output from previous day:   Intake/Output from this shift:    Labs:  Recent Labs  11/01/15 0908  WBC 6.2  HGB 14.7  PLT 210  CREATININE 0.59   Estimated Creatinine Clearance: 142.4 mL/min (by C-G formula based on Cr of 0.59). No results for input(s): VANCOTROUGH, VANCOPEAK, VANCORANDOM, GENTTROUGH, GENTPEAK, GENTRANDOM, TOBRATROUGH, TOBRAPEAK, TOBRARND, AMIKACINPEAK, AMIKACINTROU, AMIKACIN in the last 72 hours.   Microbiology: No results found for this or any previous visit (from the past 720 hour(s)).  Medical History: Past Medical History  Diagnosis Date  . Hypertension   . Sleep apnea   . Morbid obesity (HCC)   . GERD (gastroesophageal reflux disease)   . Depression   . Lymphedema     Medications:  Prescriptions prior to admission  Medication Sig Dispense Refill Last Dose  . atenolol (TENORMIN) 50 MG tablet Take 50 mg by mouth 2 (two) times daily.   10/31/2015 at 1230  . cloNIDine (CATAPRES-TTS-2) 0.2 mg/24hr patch Place 0.2 mg onto the skin once a week. On Thursday   Past Week at pm  . ketoconazole (NIZORAL) 2 % cream Apply 1 application topically daily.   10/26/2015 at pm  . lisinopril (PRINIVIL,ZESTRIL) 10 MG tablet Take 10 mg by mouth daily.   10/31/2015 at pm   Assessment: CrCl = 142.4 ml/min (~ 120 ml/min) Ke = 0.1 hr-1 T1/2 = 6.9 hrs Vd = 79.5 L    Goal of Therapy:  Vancomycin trough level 10-15 mcg/ml  Plan:  Expected duration 7 days with resolution of temperature and/or normalization of WBC   Zosyn 3.375 gm IV X 1 given on 11/10 @ 11:30. Zosyn  4.5 gm IV Q8H EI ordered to start 11/10 @ 19:00.  Vancomycin 1750 mg IV X 1 given on 11/10 @ 18:00. Vancomycin 1750 mg IV Q12H ordered to start 11/11 @ 4:00. This pt will reach Css by 10/12 @ 18:00. Will draw 1st trough on 11/12 @ 15:30, which will be at Css.   Curtez Brallier D 11/01/2015,4:42 PM

## 2015-11-01 NOTE — Evaluation (Deleted)
Physical Therapy Evaluation Patient Details Name: [name]. [name] MRN: [id number] DOB: 01/02/1961 Today's Date: 11/01/2015   History of Present Illness  Pt is a 54 yo female who was admitted to the hospital with UTI and cellulitis to the L breast and arm.  Clinical Impression  Pt presents with hx of HTN, morbid obesity, GERD, depression, and sleep apnea. Examination that pt performs bed mobility at mod A, transfers at min A, and is not able to complete ambulation secondary to pain. Her bed mobility requires the most A by far. Pt appears labored with breathing during all mobility, but her SaO2 never falls below 98%. Secondary to her decreased mobility and decreased functional strength she will continue to benefit from skilled PT in order to return to premorbid state. At this point pt is well removed from baseline. She is fairly pleasant and willing to work with therapy.     Follow Up Recommendations SNF    Equipment Recommendations  None recommended by PT    Recommendations for Other Services       Precautions / Restrictions Precautions Precautions: Fall Restrictions Weight Bearing Restrictions: No      Mobility  Bed Mobility Overal bed mobility: Needs Assistance Bed Mobility: Supine to Sit;Sit to Supine     Supine to sit: Mod assist;+2 for physical assistance Sit to supine: Mod assist;+2 for physical assistance   General bed mobility comments: Pt needs trunk and LE support for general bed mobility. Needs assist to get back to [facility]. Pt at risk for rolling off of bed  Transfers Overall transfer level: Needs assistance Equipment used:  (BRW) Transfers: Sit to/from Stand Sit to Stand: Min assist         General transfer comment: Pt with good functional strength but still requiring min assist to get into standing. Pt states that she feels pretty good in standing with no LOB  Ambulation/Gait Ambulation/Gait assistance:  (Not appropriate this date secondary to pain.)               Stairs            Wheelchair Mobility    Modified Rankin (Stroke Patients Only)       Balance Overall balance assessment: No apparent balance deficits (not formally assessed)                                           Pertinent Vitals/Pain Pain Assessment: No/denies pain    Home Living Family/patient expects to be discharged to:: Private residence Living Arrangements: Alone Available Help at Discharge: Family;Available PRN/intermittently Type of Home: Apartment Home Access: Level entry     Home Layout: One level Home Equipment:  (BRW)      Prior Function Level of Independence: Independent with assistive device(s)         Comments: Pt was indep with BRW and mostly household ambulation. Family members do her shopping for her     Hand Dominance        Extremity/Trunk Assessment   Upper Extremity Assessment: Overall WFL for tasks assessed           Lower Extremity Assessment: Overall WFL for tasks assessed         Communication   Communication: No difficulties  Cognition Arousal/Alertness: Awake/alert Behavior During Therapy: WFL for tasks assessed/performed Overall Cognitive Status: Within Functional Limits for tasks assessed  General Comments      Exercises Other Exercises Other Exercises: Pt was taught and performed bilateral therex x 10 reps at supervision for proper technique. Exercises included: ankle pumps, quad sets, hip abd, and arm reach overhead. Exercises frequency and volume placed on board for pt      Assessment/Plan    PT Assessment Patient needs continued PT services  PT Diagnosis Difficulty walking;Acute pain   PT Problem List Decreased range of motion;Decreased activity tolerance;Decreased mobility;Decreased knowledge of use of DME;Obesity;Pain  PT Treatment Interventions DME instruction;Gait training;Stair training;Functional mobility training;Therapeutic  activities;Balance training;Therapeutic exercise;Neuromuscular re-education;Cognitive remediation   PT Goals (Current goals can be found in the Care Plan section) Acute Rehab PT Goals Patient Stated Goal: to return home PT Goal Formulation: With patient Time For Goal Achievement: 11/15/15 Potential to Achieve Goals: Fair    Frequency Min 2X/week   Barriers to discharge Decreased caregiver support      Co-evaluation               End of Session Equipment Utilized During Treatment: Gait belt (2 gait belts required) Activity Tolerance: Patient tolerated treatment well;Patient limited by pain Patient left: in bed;with call bell/phone within reach;with bed alarm set Nurse Communication: Mobility status         Time: 1429-1500 PT Time Calculation (min) (ACUTE ONLY): 31 min   Charges:         PT G CodesBenna Dunks:        [name] 11/01/2015, 4:10 PM  Benna Dunksasey Thoams Siefert, SPT. [phone]

## 2015-11-01 NOTE — ED Notes (Signed)
Pt reports fatigue, generalized weakness over the last few months, pt reports weakness in her knees and edema to left breast and arm

## 2015-11-01 NOTE — Evaluation (Signed)
Physical Therapy Evaluation Patient Details Name: Bailey HurlLinda J. [name] MRN: [id number] DOB: 10/24/1961 Today's Date: 11/01/2015   History of Present Illness  Pt is a 54 yo female who was admitted to the hospital with UTI and cellulitis to the L breast and arm.  Clinical Impression  Pt presents with hx of HTN, morbid obesity, GERD, depression, and sleep apnea. Examination that pt performs bed mobility at mod A, transfers at min A, and is not able to complete ambulation secondary to pain. Her bed mobility requires the most A by far. Pt appears labored with breathing during all mobility, but her SaO2 never falls below 98%. Secondary to her decreased mobility and decreased functional strength she will continue to benefit from skilled PT in order to return to premorbid state. At this point pt is well removed from baseline. She is fairly pleasant and willing to work with therapy. This entire session was guided, instructed, and directly supervised by Elizabeth PalauStephanie Ray, DPT.     Follow Up Recommendations SNF    Equipment Recommendations   (Bariatric bedside commode )    Recommendations for Other Services       Precautions / Restrictions Precautions Precautions: Fall Restrictions Weight Bearing Restrictions: No      Mobility  Bed Mobility Overal bed mobility: Needs Assistance Bed Mobility: Supine to Sit;Sit to Supine     Supine to sit: Mod assist;+2 for physical assistance Sit to supine: Mod assist;+2 for physical assistance   General bed mobility comments: Pt needs trunk and LE support for general bed mobility. [facility]. Pt at risk for rolling off of bed  Transfers Overall transfer level: Needs assistance Equipment used:  (BRW) Transfers: Sit to/from Stand Sit to Stand: Min assist         General transfer comment: Pt with good functional strength but still requiring min assist to get into standing. Pt states that she feels pretty good in standing with no  LOB  Ambulation/Gait Ambulation/Gait assistance:  (Not appropriate this date secondary to pain.)              Stairs            Wheelchair Mobility    Modified Rankin (Stroke Patients Only)       Balance Overall balance assessment: No apparent balance deficits (not formally assessed)                                           Pertinent Vitals/Pain Pain Assessment: No/denies pain    Home Living Family/patient expects to be discharged to:: Private residence Living Arrangements: Alone Available Help at Discharge: Family;Available PRN/intermittently Type of Home: Apartment Home Access: Level entry     Home Layout: One level Home Equipment:  (BRW)      Prior Function Level of Independence: Independent with assistive device(s)         Comments: Pt was indep with BRW and mostly household ambulation. Family members do her shopping for her     Hand Dominance        Extremity/Trunk Assessment   Upper Extremity Assessment: Overall WFL for tasks assessed           Lower Extremity Assessment: Overall WFL for tasks assessed         Communication   Communication: No difficulties  Cognition Arousal/Alertness: Awake/alert Behavior During Therapy: WFL for tasks assessed/performed Overall  Cognitive Status: Within Functional Limits for tasks assessed                      General Comments      Exercises Other Exercises Other Exercises: Pt was taught and performed bilateral therex x 10 reps at supervision for proper technique. Exercises included: ankle pumps, quad sets, hip abd, and arm reach overhead. Exercises frequency and volume placed on board for pt      Assessment/Plan    PT Assessment Patient needs continued PT services  PT Diagnosis Difficulty walking;Acute pain   PT Problem List Decreased range of motion;Decreased activity tolerance;Decreased mobility;Decreased knowledge of use of DME;Obesity;Pain  PT Treatment  Interventions DME instruction;Gait training;Stair training;Functional mobility training;Therapeutic activities;Balance training;Therapeutic exercise;Neuromuscular re-education;Cognitive remediation   PT Goals (Current goals can be found in the Care Plan section) Acute Rehab PT Goals Patient Stated Goal: to return home PT Goal Formulation: With patient Time For Goal Achievement: 11/15/15 Potential to Achieve Goals: Fair    Frequency Min 2X/week   Barriers to discharge Decreased caregiver support      Co-evaluation               End of Session Equipment Utilized During Treatment: Gait belt (2 gait belts required) Activity Tolerance: Patient tolerated treatment well;Patient limited by pain Patient left: in bed;with call bell/phone within reach;with bed alarm set Nurse Communication: Mobility status    Functional Assessment Tool Used: clinical judgement Functional Limitation: Mobility: Walking and moving around Mobility: Walking and Moving Around Current Status (V4259): At least 20 percent but less than 40 percent impaired, limited or restricted Mobility: Walking and Moving Around Goal Status [phone]): At least 1 percent but less than 20 percent impaired, limited or restricted    Time: 1429-1500 PT Time Calculation (min) (ACUTE ONLY): 31 min   Charges:   PT Evaluation $Initial PT Evaluation Tier I: 1 Procedure PT Treatments $Therapeutic Exercise: 8-22 mins   PT G Codes:   PT G-Codes **NOT FOR INPATIENT CLASS** Functional Assessment Tool Used: clinical judgement Functional Limitation: Mobility: Walking and moving around Mobility: Walking and Moving Around Current Status (F6433): At least 20 percent but less than 40 percent impaired, limited or restricted Mobility: Walking and Moving Around Goal Status [phone]): At least 1 percent but less than 20 percent impaired, limited or restricted    [facility] 11/01/2015, 4:25 PM  Benna Dunks, SPT. [phone]

## 2015-11-01 NOTE — ED Notes (Signed)
Having generalized weakness and fatigue for several months. No specific pain but has noticed some swelling noted to left breast and arm..low grade fever this am no cough

## 2015-11-01 NOTE — ED Notes (Addendum)
States she developed a cold about 1 1/2 months ago  Has been weak since. Occasional prod cough ..white phlegm..noticed swelling to left breast /arm yesterday  Breast is red and swollen   Tender to touch

## 2015-11-01 NOTE — ED Provider Notes (Signed)
Time Seen: Approximately 0730 I have reviewed the triage notes  Chief Complaint: Fatigue   History of Present Illness: [name]. [name] is a 54 y.o. female who presents with some generalized fatigue and weakness. Patient's also noted some increasing left-sided chest pain and she's noticed some swelling on that side. She states that she had a cold approximately a month ago and states she's never really fully recovered. She states she still has occasional cough which is been dry overall nonproductive only with occasional white colored phlegm. She states the weakness is generalized and nonfocal denies any headache or neck discomfort. She denies any right-sided chest pain or current abdominal pain. She states she is still able to ambulate though she states it is more difficult with the weakness. She was not aware of any fever at home.   Past Medical History  Diagnosis Date  . Hypertension   . Sleep apnea   . Morbid obesity (HCC)   . GERD (gastroesophageal reflux disease)   . Depression   . Lymphedema     Patient Active Problem List   Diagnosis Date Noted  . Cellulitis 11/01/2015    History reviewed. No pertinent past surgical history.  History reviewed. No pertinent past surgical history.  No current outpatient prescriptions on file.  Allergies:  Review of patient's allergies indicates no known allergies.  Family History: Family History  Problem Relation Age of Onset  . CVA Father   . Hypertension Father   . Hypertension Mother     Social History: Social History  Substance Use Topics  . Smoking status: Never Smoker   . Smokeless tobacco: None  . Alcohol Use: 0.0 oz/week    0 Standard drinks or equivalent per week     Comment: occasional alcohol use     Review of Systems:   10 point review of systems was performed and was otherwise negative:  Constitutional: No fever Eyes: No visual disturbances ENT: No sore throat, ear pain Cardiac: No chest pain Respiratory:  No shortness of breath, wheezing, or stridor Abdomen: No abdominal pain, no vomiting, No diarrhea Endocrine: No weight loss, No night sweats Extremities: No peripheral edema, cyanosis Skin: No rashes, easy bruising Neurologic: No focal weakness, trouble with speech or swollowing Urologic: No dysuria, Hematuria, or urinary frequency    Physical Exam:  ED Triage Vitals  Enc Vitals Group     BP 11/01/15 0710 162/87 mmHg     Pulse Rate 11/01/15 0710 80     Resp 11/01/15 0758 20     Temp 11/01/15 0710 99.2 F (37.3 C)     Temp Source 11/01/15 0710 Oral     SpO2 11/01/15 0710 96 %     Weight 11/01/15 0710 460 lb (208.655 kg)     Height 11/01/15 0710  (1.575 m)     Head Cir --      Peak Flow --      Pain Score 11/01/15 0715 4     Pain Loc --      Pain Edu? --      Excl. in GC? --     General: Awake , Alert , and Oriented times 3; GCS 15 Head: Normal cephalic , atraumatic Eyes: Pupils equal , round, reactive to light Nose/Throat: No nasal drainage, patent upper airway without erythema or exudate.  Neck: Supple, Full range of motion, No anterior adenopathy or palpable thyroid masses Lungs: Limited exam secondary to patient's obesity Clear to ascultation without wheezes , rhonchi, or rales  Heart: Regular rate, regular rhythm without murmurs , gallops , or rubs Abdomen: Morbidly obese, Soft, non tender without rebound, guarding , or rigidity; bowel sounds positive and symmetric in all 4 quadrants. No organomegaly .        Extremities: 2 plus symmetric pulses. No edema, clubbing or cyanosis Neurologic: normal ambulation, Motor symmetric without deficits, sensory intact Skin: warm, dry, no rashes Examination of the left breast in the area of concern shows significant erythema and induration surrounding most of the breast tissue. There is no nipple exudate or drainage. Examination was performed with chaperone present.  Labs:   All laboratory work was reviewed including any  pertinent negatives or positives listed below:  Labs Reviewed  BASIC METABOLIC PANEL - Abnormal; Notable for the following:    Chloride 98 (*)    CO2 33 (*)    Glucose, Bld 107 (*)    Calcium 8.8 (*)    All other components within normal limits  CBC - Abnormal; Notable for the following:    MCV 113.2 (*)    MCH 36.7 (*)    RDW 16.3 (*)    All other components within normal limits  URINALYSIS COMPLETEWITH MICROSCOPIC ([facility] ONLY) - Abnormal; Notable for the following:    Color, Urine AMBER (*)    APPearance HAZY (*)    Protein, ur 30 (*)    Nitrite POSITIVE (*)    Leukocytes, UA 3+ (*)    Bacteria, UA MANY (*)    Squamous Epithelial / LPF 0-5 (*)    All other components within normal limits  CULTURE, BLOOD (ROUTINE X 2)  CULTURE, BLOOD (ROUTINE X 2)  LACTIC ACID, PLASMA  LACTIC ACID, PLASMA   review of laboratory work showed no significant findings other than what appears to be a urinary tract infection and urine culture was added  EKG:  ED ECG REPORT I, Jennye MoccasinBrian S Quigley, the attending physician, personally viewed and interpreted this ECG.  Date: 11/01/2015 EKG Time: 720 Rate: 75 Rhythm: normal sinus rhythm QRS Axis: normal Intervals: normal ST/T Wave abnormalities: Nonspecific T wave abnormality Conduction Disutrbances: none Narrative Interpretation: unremarkable   Radiology: CLINICAL DATA: Intermittent cough and congestion. Left breast swelling and redness.  EXAM: CHEST 2 VIEW  COMPARISON: February 15, 2013  FINDINGS: There is interstitial edema with cardiomegaly and pulmonary venous hypertension. No airspace consolidation. No adenopathy appreciable. No bone lesions. No visualized soft tissue lesions.  IMPRESSION: Evidence of a degree of congestive heart failure. No airspace consolidation.   Electronically Signed By: Bretta BangWilliam Woodruff III M.D. On: 11/01/2015 08:54        I personally reviewed the radiologic studies    ED Course:  * Patient's stay was uneventful and I felt was chest discomfort is most likely secondary to a large cellulitis that extends through most of the underside of her breast tissue with induration up toward the left axillary area. No palpable masses still quite frankly difficult exam secondary to patient's super morbid obesity. The patient had a cath urinalysis performed by the nursing staff it does appear to show a bacterial infection and urine culture was added. I felt was unlikely the patient was septic at this time and she was started on Zosyn.  Assessment:  Left breast cellulitis Urinary tract infection Generalized weakness Morbidly obese  Final Clinical Impression:  Final diagnoses:  Cellulitis of chest wall     Plan:  Inpatient management Patient's case was reviewed with the hospitalist team, further disposition and management depends upon her  evaluation.            Jennye Moccasin, MD 11/01/15 [phone]

## 2015-11-02 DIAGNOSIS — L03313 Cellulitis of chest wall: Secondary | ICD-10-CM | POA: Diagnosis not present

## 2015-11-02 DIAGNOSIS — B372 Candidiasis of skin and nail: Secondary | ICD-10-CM | POA: Diagnosis present

## 2015-11-02 DIAGNOSIS — N3000 Acute cystitis without hematuria: Secondary | ICD-10-CM | POA: Diagnosis present

## 2015-11-02 DIAGNOSIS — B9620 Unspecified Escherichia coli [E. coli] as the cause of diseases classified elsewhere: Secondary | ICD-10-CM | POA: Diagnosis present

## 2015-11-02 DIAGNOSIS — L03114 Cellulitis of left upper limb: Secondary | ICD-10-CM | POA: Diagnosis present

## 2015-11-02 DIAGNOSIS — Z79899 Other long term (current) drug therapy: Secondary | ICD-10-CM | POA: Diagnosis not present

## 2015-11-02 DIAGNOSIS — I5031 Acute diastolic (congestive) heart failure: Secondary | ICD-10-CM | POA: Diagnosis present

## 2015-11-02 DIAGNOSIS — J9601 Acute respiratory failure with hypoxia: Secondary | ICD-10-CM | POA: Diagnosis present

## 2015-11-02 DIAGNOSIS — Z823 Family history of stroke: Secondary | ICD-10-CM | POA: Diagnosis not present

## 2015-11-02 DIAGNOSIS — J984 Other disorders of lung: Secondary | ICD-10-CM | POA: Diagnosis present

## 2015-11-02 DIAGNOSIS — I110 Hypertensive heart disease with heart failure: Secondary | ICD-10-CM | POA: Diagnosis present

## 2015-11-02 DIAGNOSIS — N61 Mastitis without abscess: Secondary | ICD-10-CM | POA: Diagnosis present

## 2015-11-02 DIAGNOSIS — Z6841 Body Mass Index (BMI) 40.0 and over, adult: Secondary | ICD-10-CM | POA: Diagnosis not present

## 2015-11-02 DIAGNOSIS — K219 Gastro-esophageal reflux disease without esophagitis: Secondary | ICD-10-CM | POA: Diagnosis present

## 2015-11-02 DIAGNOSIS — Z8249 Family history of ischemic heart disease and other diseases of the circulatory system: Secondary | ICD-10-CM | POA: Diagnosis not present

## 2015-11-02 DIAGNOSIS — G4730 Sleep apnea, unspecified: Secondary | ICD-10-CM | POA: Diagnosis present

## 2015-11-02 LAB — BASIC METABOLIC PANEL
Anion gap: 7 (ref 5–15)
BUN: 8 mg/dL (ref 6–20)
CALCIUM: 8.6 mg/dL — AB (ref 8.9–10.3)
CHLORIDE: 97 mmol/L — AB (ref 101–111)
CO2: 37 mmol/L — ABNORMAL HIGH (ref 22–32)
CREATININE: 0.71 mg/dL (ref 0.44–1.00)
GFR calc Af Amer: >60 mL/min (ref >60)
GFR calc non Af Amer: >60 mL/min (ref >60)
Glucose, Bld: 87 mg/dL (ref 65–99)
Potassium: 3.8 mmol/L (ref 3.5–5.1)
SODIUM: 141 mmol/L (ref 135–145)

## 2015-11-02 LAB — CBC
HCT: 43 % (ref 35.0–47.0)
Hemoglobin: 13.9 g/dL (ref 12.0–16.0)
MCH: 37.5 pg — AB (ref 26.0–34.0)
MCHC: 32.3 g/dL (ref 32.0–36.0)
MCV: 116.2 fL — AB (ref 80.0–100.0)
PLATELETS: 178 10*3/uL (ref 150–440)
RBC: 3.7 MIL/uL — ABNORMAL LOW (ref 3.80–5.20)
RDW: 16.5 % — AB (ref 11.5–14.5)
WBC: 6.8 10*3/uL (ref 3.6–11.0)

## 2015-11-02 MED ORDER — POTASSIUM CHLORIDE CRYS ER 20 MEQ PO TBCR
40.0000 meq | EXTENDED_RELEASE_TABLET | Freq: Every day | ORAL | Status: AC
  Administered 2015-11-02 – 2015-11-03 (×2): 40 meq via ORAL
  Filled 2015-11-02 (×2): qty 2

## 2015-11-02 NOTE — Progress Notes (Signed)
[facility] -  at [facility]   PATIENT NAME: [name]    MR#:  [id number]  DATE OF BIRTH:  09/20/1961  SUBJECTIVE:  CHIEF COMPLAINT:   Chief Complaint  Patient presents with  . Fatigue   Admitted for worsening anasarca and generalized weakness. Patient stopped taking her Lasix and chlorthalidone among back since she was unable to get to the bathroom and back from weakness. Continues to feel weak. Physical therapy has worked with patient and recommended SNF.  REVIEW OF SYSTEMS:    Review of Systems  Constitutional: Positive for malaise/fatigue. Negative for fever and chills.  HENT: Negative for sore throat.   Eyes: Negative for blurred vision, double vision and pain.  Respiratory: Positive for cough. Negative for hemoptysis, shortness of breath and wheezing.   Cardiovascular: Negative for chest pain, palpitations, orthopnea and leg swelling.  Gastrointestinal: Positive for constipation. Negative for heartburn, nausea, vomiting, abdominal pain and diarrhea.  Genitourinary: Negative for dysuria and hematuria.  Musculoskeletal: Positive for back pain. Negative for joint pain.  Skin: Negative for rash.  Neurological: Positive for weakness. Negative for sensory change, speech change, focal weakness and headaches.  Endo/Heme/Allergies: Does not bruise/bleed easily.  Psychiatric/Behavioral: Negative for depression. The patient is not nervous/anxious.    DRUG ALLERGIES:  No Known Allergies  VITALS:  Blood pressure 117/54, pulse 59, temperature 97.9 F (36.6 C), temperature source Oral, resp. rate 20, height  (1.575 m), weight 208.655 kg (460 lb), SpO2 100 %.  PHYSICAL EXAMINATION:   Physical Exam  GENERAL: 54 y.o.-year-old morbidly obese patient sitting in the bed with no acute distress.  EYES: Pupils equal, round, reactive to light and accommodation. Uses glasses. No scleral icterus. Extraocular muscles intact.  HEENT: Head atraumatic,  normocephalic. Oropharynx and nasopharynx clear.  NECK: Supple, no jugular venous distention. No thyroid enlargement, no tenderness.  LUNGS: Normal breath sounds bilaterally, no wheezing, rales,rhonchi or crepitation. Scant breath sounds are audible due to body habitus. Decreased bibasilar breath sounds. No use of accessory muscles of respiration.  Breasts- enlarged and heavy left breast with significant subcutaneous edema. Underneath both breasts, patient has likely candidal infection. Some erythema and tenderness also spreads onto superior abdominal wall. CARDIOVASCULAR: S1, S2 normal. No murmurs, rubs, or gallops.  ABDOMEN: Very obese abdomen, Soft, nontender. Bowel sounds present. No organomegaly or mass.  EXTREMITIES: Chronic bilateral lymphedema, skin of left leg is more thickened and discolored due to previous history of cellulitis. Some cellulitis with redness also extends onto left arm which she is more swollen than the right one. No cyanosis, or clubbing.  NEUROLOGIC: Cranial nerves II through XII are intact. Muscle strength 4/5 in all extremities with generalized weakness. Sensation intact. Gait not checked.  PSYCHIATRIC: The patient is alert and oriented x 3.  SKIN: Thickened and discolored skin posterior left leg , in no early left breast is warm and tender, also some erythema of left upper arm   LABORATORY PANEL:   CBC  Recent Labs Lab 11/02/15 0525  WBC 6.8  HGB 13.9  HCT 43.0  PLT 178   ------------------------------------------------------------------------------------------------------------------  Chemistries   Recent Labs Lab 11/02/15 0525  NA 141  K 3.8  CL 97*  CO2 37*  GLUCOSE 87  BUN 8  CREATININE 0.71  CALCIUM 8.6*   ------------------------------------------------------------------------------------------------------------------  Cardiac Enzymes No results for input(s): TROPONINI in the last 168  hours. ------------------------------------------------------------------------------------------------------------------  RADIOLOGY:  Dg Chest 2 View  11/01/2015  CLINICAL DATA:  Intermittent cough and congestion. Left  breast swelling and redness. EXAM: CHEST  2 VIEW COMPARISON:  February 15, 2013 FINDINGS: There is interstitial edema with cardiomegaly and pulmonary venous hypertension. No airspace consolidation. No adenopathy appreciable. No bone lesions. No visualized soft tissue lesions. IMPRESSION: Evidence of a degree of congestive heart failure. No airspace consolidation. Electronically Signed   By: Bretta BangWilliam  Woodruff III M.D.   On: 11/01/2015 08:54     ASSESSMENT AND PLAN:   [name] is a 54 y.o. female with a known history of morbid obesity, gastroesophageal reflux disease, hypertension depression and chronic bilateral lower extremity edema presents to hospital secondary to generalized weakness, worsening left breast swelling, tenderness and erythema.  # Cellulitis of left breast and left arm-keep the left breast elevated, Lasix started to decrease the swelling. -Blood cultures are growing Anerobic Gram positive cocci. -On broad-spectrum antibiotics with vancomycin and Zosyn at this time. Will repeat Bcx today.  # Acute cystitis-urine and blood cultures ordered. -Patient is on broad-spectrum antibiotics. Await cx results  # Candidal infection of the skin-poor hygiene in general. -Started on nystatin ointment twice a day  # Hypertension-well controlled. Continue atenolol, chlorthalidone, clonidine, Lasix and lisinopril  # chronic lower extremity edema-restart her Lasix and chlorthalidone here. - Foley placed as patient too weak to get to bedside commode and requested foley catheter.  # DVT prophylaxis-started on Lovenox  All the records are reviewed and case discussed with Care Management/Social Workerr. Management plans discussed with the patient, family and they are in  agreement.  CODE STATUS: FULL  DVT Prophylaxis: SCDs  TOTAL TIME TAKING CARE OF THIS PATIENT: 35 minutes.   POSSIBLE D/C IN 2-3 DAYS, DEPENDING ON CLINICAL CONDITION. SNF at discharge.   Milagros LollSudini, [name] R M.D on 11/02/2015 at 11:52 AM  Between 7am to 6pm - Pager - [phone]  After 6pm go to [email or web address] - password [email or web address] [facility]  [facility]  Office  [phone]  CC: Primary care physician; No primary care provider on file.  Note: This dictation was prepared with Dragon dictation along with smaller phrase technology. Any transcriptional errors that result from this process are unintentional.

## 2015-11-02 NOTE — Clinical Social Work Note (Signed)
Clinical Social Work Assessment  Patient Details  Name: [name] MRN: [id number] Date of Birth: 27-Dec-1960  Date of referral:  11/02/15               Reason for consult:  Facility Placement                Permission sought to share information with:    Permission granted to share information::     Name::        Agency::     Relationship::     Contact Information:     Housing/Transportation Living arrangements for the past 2 months:  Single Family Home Source of Information:  Patient Patient Interpreter Needed:  None Criminal Activity/Legal Involvement Pertinent to Current Situation/Hospitalization:  No - Comment as needed Significant Relationships:    Lives with:    Do you feel safe going back to the place where you live?  Yes Need for family participation in patient care:  No (Coment)  Care giving concerns:  Patient lives alone at home.   Social Worker assessment / plan:  CSW met with patient this morning and explained the role of CSW. CSW also discussed the recommendations made by PT for STR (short term rehab). Patient was sitting up eating her lunch during our conversation. Patient states she does not want to do rehab in a facility and wants to return home with home health and personal care services. CSW reiterated that these services do not stay with her in the home and she verbalized understanding. Patient stated to CSW that she believes she can get up from a seated position on her own and ambulate with her walker. When asked if she could get out of her home on her own if there was a fire or an emergency, patient replied that she would be able to. Patient states she has been doing this at home prior to the hospitalization. Patient states she would like a bedside commode because she states that taking lasix makes her need to go to the bathroom frequently and a bedside commode would be easier. Patient states she is able to get her necessities at home as well. Information shared  with RN CM.  Employment status:  Disabled (Comment on whether or not currently receiving Disability) Insurance information:  Medicaid In [place] PT Recommendations:  [place] / Referral to community resources:     Patient/Family's Response to care:  Patient is not interested in going to a rehab facility at this time.  Patient/Family's Understanding of and Emotional Response to Diagnosis, Current Treatment, and Prognosis:  Patient expressed understanding of recommendations and wishes to try to return home.  Emotional Assessment Appearance:  Appears stated age Attitude/Demeanor/Rapport:   (pleasant and cooperative) Affect (typically observed):  Accepting, Appropriate, Calm Orientation:  Oriented to Self, Oriented to Place, Oriented to  Time, Oriented to Situation Alcohol / Substance use:  Not Applicable Psych involvement (Current and /or in the community):  No (Comment)  Discharge Needs  Concerns to be addressed:  Care Coordination Readmission within the last 30 days:  No Current discharge risk:  None Barriers to Discharge:  No Barriers Identified   Shela Leff, LCSW 11/02/2015, 12:18 PM

## 2015-11-02 NOTE — Progress Notes (Signed)
Lab called and stated patient had blood cultures come back positive for Anarobic gram variable cocci. Spoke with hospitalist and notified him that she is on Vanc and Zosyn. No new orders given.

## 2015-11-02 NOTE — Progress Notes (Signed)
Foley inserted per MD due to Patient receiving IV lasix, being bariatric, and too weak to continue walking to bathroom.

## 2015-11-03 LAB — BASIC METABOLIC PANEL
ANION GAP: 8 (ref 5–15)
BUN: 8 mg/dL (ref 6–20)
CHLORIDE: 96 mmol/L — AB (ref 101–111)
CO2: 39 mmol/L — AB (ref 22–32)
Calcium: 8.6 mg/dL — ABNORMAL LOW (ref 8.9–10.3)
Creatinine, Ser: 0.67 mg/dL (ref 0.44–1.00)
GFR calc non Af Amer: >60 mL/min (ref >60)
Glucose, Bld: 94 mg/dL (ref 65–99)
Potassium: 4.2 mmol/L (ref 3.5–5.1)
SODIUM: 143 mmol/L (ref 135–145)

## 2015-11-03 MED ORDER — DEXTROSE 5 % IV SOLN
2.0000 g | INTRAVENOUS | Status: DC
  Administered 2015-11-03 – 2015-11-04 (×2): 2 g via INTRAVENOUS
  Filled 2015-11-03 (×3): qty 2

## 2015-11-03 NOTE — Progress Notes (Signed)
Patient ID: Bailey HurlLinda J. [name], female   DOB: 02-23-1961, 54 y.o.   MRN: [id number] [facility] PROGRESS NOTE  PCP: No primary care provider on file.  HPI/Subjective: Patient states her breathing is better. Some cough. No pain anywhere. Some redness on the left arm and left breast.  Objective: Filed Vitals:   11/03/15 1303  BP: 116/57  Pulse: 63  Temp: 98.2 F (36.8 C)  Resp: 20    Filed Weights   11/01/15 0710  Weight: 208.655 kg (460 lb)    ROS: Review of Systems  Constitutional: Negative for fever and chills.  Eyes: Negative for blurred vision.  Respiratory: Positive for cough and shortness of breath.   Cardiovascular: Negative for chest pain.  Gastrointestinal: Negative for nausea, vomiting, abdominal pain, diarrhea and constipation.  Genitourinary: Negative for dysuria.  Musculoskeletal: Negative for joint pain.  Neurological: Negative for dizziness and headaches.   Exam: Physical Exam  Constitutional: She is oriented to person, place, and time.  HENT:  Nose: No mucosal edema.  Mouth/Throat: No oropharyngeal exudate or posterior oropharyngeal edema.  Eyes: Conjunctivae, EOM and lids are normal. Pupils are equal, round, and reactive to light.  Neck: No JVD present. Carotid bruit is not present. No edema present. No thyroid mass and no thyromegaly present.  Cardiovascular: S1 normal and S2 normal.  Exam reveals no gallop.   No murmur heard. Pulses:      Dorsalis pedis pulses are 2+ on the right side, and 2+ on the left side.  Respiratory: No respiratory distress. She has decreased breath sounds in the right lower field and the left lower field. She has wheezes in the right lower field and the left lower field. She has no rhonchi. She has no rales.  GI: Soft. Bowel sounds are normal. There is no tenderness.  Musculoskeletal:       Right knee: She exhibits swelling.       Left knee: She exhibits swelling.       Right ankle: She exhibits swelling.   Left ankle: She exhibits swelling.  Lymphadenopathy:    She has no cervical adenopathy.  Neurological: She is alert and oriented to person, place, and time. No cranial nerve deficit.  Skin: Skin is warm. Nails show no clubbing.  Erythema left upper arm and breast. Fungal infection under breasts starting to fade.  Psychiatric: She has a normal mood and affect.    Data Reviewed: Basic Metabolic Panel:  Recent Labs Lab 11/01/15 0908 11/02/15 0525 11/03/15 0340  NA 139 141 143  K 4.0 3.8 4.2  CL 98* 97* 96*  CO2 33* 37* 39*  GLUCOSE 107* 87 94  BUN 8 8 8   CREATININE 0.59 0.71 0.67  CALCIUM 8.8* 8.6* 8.6*   CBC:  Recent Labs Lab 11/01/15 0908 11/02/15 0525  WBC 6.2 6.8  HGB 14.7 13.9  HCT 45.4 43.0  MCV 113.2* 116.2*  PLT 210 178     Recent Results (from the past 240 hour(s))  Culture, blood (routine x 2)     Status: None (Preliminary result)   Collection Time: 11/01/15  9:00 AM  Result Value Ref Range Status   Specimen Description BLOOD RIGHT ARM  Final   Special Requests   Final    BOTTLES DRAWN AEROBIC AND ANAEROBIC  1CC AERO 2 CC ANAERO   Culture NO GROWTH 2 DAYS  Final   Report Status PENDING  Incomplete  Culture, blood (routine x 2)     Status: None (Preliminary result)  Collection Time: 11/01/15  9:08 AM  Result Value Ref Range Status   Specimen Description BLOOD RIGHT UPPER ARM  Final   Special Requests BOTTLES DRAWN AEROBIC AND ANAEROBIC  1CC  Final   Culture  Setup Time   Final    GRAM VARIABLE COCCI ANAEROBIC BOTTLE ONLY CRITICAL RESULT CALLED TO, READ BACK BY AND VERIFIED WITH: VERNICIA GRAVES AT 1610 11/02/15 DV    Culture   Final    COAGULASE NEGATIVE STAPHYLOCOCCUS Results consistent with contamination.    Report Status PENDING  Incomplete  Culture, blood (routine x 2)     Status: None (Preliminary result)   Collection Time: 11/02/15 12:50 PM  Result Value Ref Range Status   Specimen Description BLOOD LEFT HAND  Final   Special Requests    Final    BOTTLES DRAWN AEROBIC AND ANAEROBIC  8CC AERO, 7CC ANAERO   Culture NO GROWTH < 24 HOURS  Final   Report Status PENDING  Incomplete  Culture, blood (routine x 2)     Status: None (Preliminary result)   Collection Time: 11/02/15  1:06 PM  Result Value Ref Range Status   Specimen Description BLOOD RIGHT HAND  Final   Special Requests   Final    BOTTLES DRAWN AEROBIC AND ANAEROBIC  8CC AERO, 6CC ANAERO   Culture NO GROWTH < 24 HOURS  Final   Report Status PENDING  Incomplete     Scheduled Meds: . atenolol  50 mg Oral BID  . cefTRIAXone (ROCEPHIN)  IV  2 g Intravenous Q24H  . chlorthalidone  25 mg Oral Daily  . cloNIDine  0.2 mg Transdermal Weekly  . docusate sodium  100 mg Oral BID  . enoxaparin (LOVENOX) injection  40 mg Subcutaneous BID  . furosemide  40 mg Oral BID  . lisinopril  10 mg Oral Daily  . nystatin-triamcinolone ointment   Topical BID   Assessment/Plan:  1. Cellulitis of left breast and arm. Switch antibiotics over to Rocephin. Blood culture contamination. Repeat blood cultures negative. 2. Acute cystitis with E coli- Rocephin would cover 3. Fungal infection under breasts continue nystatin ointment 4. Essential hypertension continue usual medications 5. Morbid obesity and anasarca continue Lasix 6. Sleep apnea- continue CPAP  Code Status:     Code Status Orders        Start     Ordered   11/01/15 1242  Full code   Continuous     11/01/15 1241     Family Communication: Family at bedside Disposition Plan: Home soon  Antibiotics:  rocephin  Time spent: 25 minutes  Alford Highland  [facility] [place] [facility]

## 2015-11-04 NOTE — Progress Notes (Signed)
Pt states that [facility] does not work for her.

## 2015-11-04 NOTE — Progress Notes (Signed)
Patient ID: Carlena HurlLinda J. [name], female   DOB: [birth year], 54 y.o.   MRN: [id number] [facility] PRO[facility]  PCP: No primary care provider on file.  HPI/Subjective: Patient has some shortness of breath and some cough. Left breast still red and heavy.  Objective: Filed Vitals:   11/04/15 1352  BP: 122/48  Pulse: 63  Temp:   Resp: 18    Filed Weights   11/01/15 0710  Weight: 208.655 kg (460 lb)    ROS: Review of Systems  Constitutional: Negative for fever and chills.  Eyes: Negative for blurred vision.  Respiratory: Positive for cough and shortness of breath.   Cardiovascular: Negative for chest pain.  Gastrointestinal: Negative for nausea, vomiting, abdominal pain, diarrhea and constipation.  Genitourinary: Negative for dysuria.  Musculoskeletal: Negative for joint pain.  Neurological: Negative for dizziness and headaches.   Exam: Physical Exam  Constitutional: She is oriented to person, place, and time.  HENT:  Nose: No mucosal edema.  Mouth/Throat: No oropharyngeal exudate or posterior oropharyngeal edema.  Eyes: Conjunctivae, EOM and lids are normal. Pupils are equal, round, and reactive to light.  Neck: No JVD present. Carotid bruit is not present. No edema present. No thyroid mass and no thyromegaly present.  Cardiovascular: S1 normal and S2 normal.  Exam reveals no gallop.   No murmur heard. Pulses:      Dorsalis pedis pulses are 2+ on the right side, and 2+ on the left side.  Respiratory: No respiratory distress. She has decreased breath sounds in the right lower field and the left lower field. She has wheezes in the right lower field and the left lower field. She has no rhonchi. She has no rales.  GI: Soft. Bowel sounds are normal. There is no tenderness.  Musculoskeletal:       Right knee: She exhibits swelling.       Left knee: She exhibits swelling.       Right ankle: She exhibits swelling.       Left ankle: She exhibits swelling.   Lymphadenopathy:    She has no cervical adenopathy.  Neurological: She is alert and oriented to person, place, and time. No cranial nerve deficit.  Skin: Skin is warm. Nails show no clubbing.  Erythema left upper arm starting to fade. Patient still has erythema on the left breast. Looks a little bit better than yesterday. Fungal infection under breasts starting to fade.  Psychiatric: She has a normal mood and affect.    Data Reviewed: Basic Metabolic Panel:  Recent Labs Lab 11/01/15 0908 11/02/15 0525 11/03/15 0340  NA 139 141 143  K 4.0 3.8 4.2  CL 98* 97* 96*  CO2 33* 37* 39*  GLUCOSE 107* 87 94  BUN 8 8 8   CREATININE 0.59 0.71 0.67  CALCIUM 8.8* 8.6* 8.6*   CBC:  Recent Labs Lab 11/01/15 0908 11/02/15 0525  WBC 6.2 6.8  HGB 14.7 13.9  HCT 45.4 43.0  MCV 113.2* 116.2*  PLT 210 178     Recent Results (from the past 240 hour(s))  Culture, blood (routine x 2)     Status: None (Preliminary result)   Collection Time: 11/01/15  9:00 AM  Result Value Ref Range Status   Specimen Description BLOOD RIGHT ARM  Final   Special Requests   Final    BOTTLES DRAWN AEROBIC AND ANAEROBIC  1CC AERO 2 CC ANAERO   Culture NO GROWTH 3 DAYS  Final   Report Status PENDING  Incomplete  Culture,  blood (routine x 2)     Status: None (Preliminary result)   Collection Time: 11/01/15  9:08 AM  Result Value Ref Range Status   Specimen Description BLOOD RIGHT UPPER ARM  Final   Special Requests BOTTLES DRAWN AEROBIC AND ANAEROBIC  1CC  Final   Culture  Setup Time   Final    GRAM VARIABLE COCCI ANAEROBIC BOTTLE ONLY CRITICAL RESULT CALLED TO, READ BACK BY AND VERIFIED WITH: VERNICIA GRAVES AT 1610 11/02/15 DV    Culture   Final    COAGULASE NEGATIVE STAPHYLOCOCCUS Results consistent with contamination.    Report Status PENDING  Incomplete  Culture, blood (routine x 2)     Status: None (Preliminary result)   Collection Time: 11/02/15 12:50 PM  Result Value Ref Range Status    Specimen Description BLOOD LEFT HAND  Final   Special Requests   Final    BOTTLES DRAWN AEROBIC AND ANAEROBIC  8CC AERO, 7CC ANAERO   Culture NO GROWTH 2 DAYS  Final   Report Status PENDING  Incomplete  Culture, blood (routine x 2)     Status: None (Preliminary result)   Collection Time: 11/02/15  1:06 PM  Result Value Ref Range Status   Specimen Description BLOOD RIGHT HAND  Final   Special Requests   Final    BOTTLES DRAWN AEROBIC AND ANAEROBIC  8CC AERO, 6CC ANAERO   Culture NO GROWTH 2 DAYS  Final   Report Status PENDING  Incomplete     Scheduled Meds: . atenolol  50 mg Oral BID  . cefTRIAXone (ROCEPHIN)  IV  2 g Intravenous Q24H  . chlorthalidone  25 mg Oral Daily  . cloNIDine  0.2 mg Transdermal Weekly  . docusate sodium  100 mg Oral BID  . enoxaparin (LOVENOX) injection  40 mg Subcutaneous BID  . furosemide  40 mg Oral BID  . lisinopril  10 mg Oral Daily  . nystatin-triamcinolone ointment   Topical BID   Assessment/Plan:  1. Cellulitis of left breast and arm. Continue IV Rocephin today. Potentially switch over to by mouth clindamycin upon discharge. Blood culture contamination. Repeat blood cultures negative. 2. Acute cystitis with E coli- Rocephin would cover 3. Fungal infection under breasts continue nystatin ointment 4. Essential hypertension continue usual medications 5. Morbid obesity and anasarca continue Lasix 6. Sleep apnea- continue CPAP  Code Status:     Code Status Orders        Start     Ordered   11/01/15 1242  Full code   Continuous     11/01/15 1241     Family Communication: Family yesterday Disposition Plan: Home potentially tomorrow  Antibiotics:  rocephin  Time spent: 20 minutes  Alford Highland  [facility]

## 2015-11-05 MED ORDER — CLINDAMYCIN PHOSPHATE 900 MG/50ML IV SOLN
900.0000 mg | Freq: Three times a day (TID) | INTRAVENOUS | Status: DC
  Administered 2015-11-05 – 2015-11-06 (×4): 900 mg via INTRAVENOUS
  Filled 2015-11-05 (×6): qty 50

## 2015-11-05 NOTE — Progress Notes (Signed)
Physical Therapy Treatment Patient Details Name: [name]. Carton MRN: [id number] DOB: 10-21-1961 Today's Date: 11/05/2015    History of Present Illness Pt is a 54 yo female who was admitted to the hospital with UTI and cellulitis to the L breast and arm.    PT Comments    Pt with improved participation in therapy.  She was able to perform bed mobility with minA x1 requiring assistance for LE placement.  Pt ambulated to BR with CGA with x 1 cue for RW placement for safety.  She demonstrated CGA with sit<>stand transfers from bed and toilet.  HEP was reviewed and modified to add LAQ while sitting in chair.  She would con't to benefit from skilled PT to increase functional mobility for safety with bed mobility and transfers.    Follow Up Recommendations  SNF     Equipment Recommendations       Recommendations for Other Services       Precautions / Restrictions Precautions Precautions: Fall Restrictions Weight Bearing Restrictions: No    Mobility  Bed Mobility Overal bed mobility: Needs Assistance Bed Mobility: Supine to Sit;Sit to Supine     Supine to sit: Min assist Sit to supine: Min assist   General bed mobility comments: Pt needed assistance for LE placement  Transfers Overall transfer level: Modified independent Equipment used: Rolling walker (2 wheeled) (Bariatric) Transfers: Sit to/from Stand Sit to Stand: Min guard         General transfer comment: Pt performed sit<>stand transfer from bed and toilet  Ambulation/Gait Ambulation/Gait assistance: Min guard Ambulation Distance (Feet): 15 Feet Assistive device: Rolling walker (2 wheeled) (Bariatric) Gait Pattern/deviations: WFL(Within Functional Limits);Wide base of support     General Gait Details: Wide BOS d/t body mass   Stairs            Wheelchair Mobility    Modified Rankin (Stroke Patients Only)       Balance Overall balance assessment: No apparent balance deficits (not formally  assessed)                                  Cognition Arousal/Alertness: Awake/alert Behavior During Therapy: WFL for tasks assessed/performed Overall Cognitive Status: Within Functional Limits for tasks assessed                      Exercises Other Exercises Other Exercises: Pt performed seated LAQ, ankle pumps, hip add with modified pillow squeeze    General Comments        Pertinent Vitals/Pain Pain Assessment: 0-10 Pain Score: 6  Pain Location: R breast    Home Living                      Prior Function            PT Goals (current goals can now be found in the care plan section) Acute Rehab PT Goals Patient Stated Goal: to return home PT Goal Formulation: With patient Time For Goal Achievement: 11/15/15 Potential to Achieve Goals: Fair    Frequency  Min 2X/week    PT Plan      Co-evaluation             End of Session Equipment Utilized During Treatment: Gait belt (x2) Activity Tolerance: Patient tolerated treatment well Patient left: in bed;with bed alarm set     Time: 1540-1607 PT Time Calculation (min) (ACUTE ONLY):  27 min  Charges:  $Therapeutic Exercise: 8-22 mins $Therapeutic Activity: 8-22 mins                    G Codes:      Stesha Neyens 11/05/2015, 4:16 PM  Kyera Felan, PTA

## 2015-11-05 NOTE — Care Management Note (Signed)
Case Management Note  Patient Details  Name: [name] MRN: [id number] Date of Birth: 1961/05/07  Subjective/Objective:            Spoke with patient for discharge planning, patient is alert oriented from home alone.    Patient has bariatric walker and stated that her how is setup for handicapped access.  Patient requested a bedside commode. Patient relies on her daughter and sister to help her with Transportation, food and supplies. Denies difficulty with medications. PCP is Dr Gerhard MunchWeil.  Offered choice of HH providers and patient chose [facility]. Referral placed with Feliberto GottronJason Hinton at [facility]. Anticipate discharge soon. Nursing is attempting to wean from O2 .  Patient may need home O2.    Action/Plan: Home with Home Health   Expected Discharge Date:                  Expected Discharge Plan:  Home w Home Health Services  In-House Referral:     Discharge planning Services  CM Consult  Post Acute Care Choice:  Home Health Choice offered to:  Patient  DME Arranged:  Bedside commode DME Agency:  [facility]  HH Arranged:  [facility], PT, [facility]  Status of Service:  In process, will continue to follow  Medicare Important Message Given:    Date Medicare IM Given:    Medicare IM give by:    Date Additional Medicare IM Given:    Additional Medicare Important Message give by:     If discussed at Long Length of Stay Meetings, dates discussed:    Additional Comments:  Adonis HugueninBerkhead, Shuntay Everetts L, RN 11/05/2015, 10:04 AM

## 2015-11-05 NOTE — Progress Notes (Signed)
Patient ID: [name]. Rhatigan, female   DOB: 09-09-1961, 54 y.o.   MRN: [id number] [facility] PROGRESS NOTE  PCP: No primary care provider on file.  HPI/Subjective: Patient has some shortness of breath and some cough. Left breast still red and heavy.  Objective: Filed Vitals:   11/05/15 1303  BP: 113/43  Pulse: 65  Temp: 98.3 F (36.8 C)  Resp: 20    Filed Weights   11/01/15 0710  Weight: 208.655 kg (460 lb)    ROS: Review of Systems  Constitutional: Negative for fever and chills.  Eyes: Negative for blurred vision.  Respiratory: Positive for cough and shortness of breath.   Cardiovascular: Negative for chest pain.  Gastrointestinal: Negative for nausea, vomiting, abdominal pain, diarrhea and constipation.  Genitourinary: Negative for dysuria.  Musculoskeletal: Negative for joint pain.  Neurological: Negative for dizziness and headaches.   Exam: Physical Exam  Constitutional: She is oriented to person, place, and time.  HENT:  Nose: No mucosal edema.  Mouth/Throat: No oropharyngeal exudate or posterior oropharyngeal edema.  Eyes: Conjunctivae, EOM and lids are normal. Pupils are equal, round, and reactive to light.  Neck: No JVD present. Carotid bruit is not present. No edema present. No thyroid mass and no thyromegaly present.  Cardiovascular: S1 normal and S2 normal.  Exam reveals no gallop.   No murmur heard. Pulses:      Dorsalis pedis pulses are 2+ on the right side, and 2+ on the left side.  Respiratory: No respiratory distress. She has decreased breath sounds in the right lower field and the left lower field. She has wheezes in the right lower field and the left lower field. She has no rhonchi. She has no rales.  GI: Soft. Bowel sounds are normal. There is no tenderness.  Musculoskeletal:       Right knee: She exhibits swelling.       Left knee: She exhibits swelling.       Right ankle: She exhibits swelling.       Left ankle: She exhibits  swelling.  Lymphadenopathy:    She has no cervical adenopathy.  Neurological: She is alert and oriented to person, place, and time. No cranial nerve deficit.  Skin: Skin is warm. Nails show no clubbing.  Patient with warmth on the upper arm and erythema starting to fade. Left breast with warmth and erythema. No mass felt.  Psychiatric: She has a normal mood and affect.    Data Reviewed: Basic Metabolic Panel:  Recent Labs Lab 11/01/15 0908 11/02/15 0525 11/03/15 0340  NA 139 141 143  K 4.0 3.8 4.2  CL 98* 97* 96*  CO2 33* 37* 39*  GLUCOSE 107* 87 94  BUN CREATININE 0.59 0.71 0.67  CALCIUM 8.8* 8.6* 8.6*   CBC:  Recent Labs Lab 11/01/15 0908 11/02/15 0525  WBC 6.2 6.8  HGB 14.7 13.9  HCT 45.4 43.0  MCV 113.2* 116.2*  PLT 210 178     Recent Results (from the past 240 hour(s))  Culture, blood (routine x 2)     Status: None (Preliminary result)   Collection Time: 11/01/15  9:00 AM  Result Value Ref Range Status   Specimen Description BLOOD RIGHT ARM  Final   Special Requests   Final    BOTTLES DRAWN AEROBIC AND ANAEROBIC  1CC AERO 2 CC ANAERO   Culture NO GROWTH 4 DAYS  Final   Report Status PENDING  Incomplete  Culture, blood (routine x 2)  Status: None (Preliminary result)   Collection Time: 11/01/15  9:08 AM  Result Value Ref Range Status   Specimen Description BLOOD RIGHT UPPER ARM  Final   Special Requests BOTTLES DRAWN AEROBIC AND ANAEROBIC  1CC  Final   Culture  Setup Time   Final    GRAM VARIABLE COCCI ANAEROBIC BOTTLE ONLY CRITICAL RESULT CALLED TO, READ BACK BY AND VERIFIED WITH: VERNICIA GRAVES AT [id number] 11/02/15 DV    Culture   Final    COAGULASE NEGATIVE STAPHYLOCOCCUS Results consistent with contamination.    Report Status PENDING  Incomplete  Culture, blood (routine x 2)     Status: None (Preliminary result)   Collection Time: 11/02/15 12:50 PM  Result Value Ref Range Status   Specimen Description BLOOD LEFT HAND  Final   Special  Requests   Final    BOTTLES DRAWN AEROBIC AND ANAEROBIC  8CC AERO, 7CC ANAERO   Culture NO GROWTH 3 DAYS  Final   Report Status PENDING  Incomplete  Culture, blood (routine x 2)     Status: None (Preliminary result)   Collection Time: 11/02/15  1:06 PM  Result Value Ref Range Status   Specimen Description BLOOD RIGHT HAND  Final   Special Requests   Final    BOTTLES DRAWN AEROBIC AND ANAEROBIC  8CC AERO, 6CC ANAERO   Culture NO GROWTH 3 DAYS  Final   Report Status PENDING  Incomplete     Scheduled Meds: . atenolol  50 mg Oral BID  . chlorthalidone  25 mg Oral Daily  . clindamycin (CLEOCIN) IV  900 mg Intravenous 3 times per day  . cloNIDine  0.2 mg Transdermal Weekly  . docusate sodium  100 mg Oral BID  . enoxaparin (LOVENOX) injection  40 mg Subcutaneous BID  . furosemide  40 mg Oral BID  . lisinopril  10 mg Oral Daily  . nystatin-triamcinolone ointment   Topical BID   Assessment/Plan:  1. Cellulitis of left breast and arm. Switch to IV clindamycin.  Blood culture contamination. Repeat blood cultures negative. Reassess tomorrow for potential discharge 2. Acute cystitis with E coli- completed treatment with Rocephin 3. Fungal infection under breasts continue nystatin ointment 4. Essential hypertension continue usual medications 5. Morbid obesity and anasarca continue Lasix 6. Sleep apnea- continue CPAP 7. Acute respiratory failure with hypoxia- pulse ox dropped down to 86% on room air. I spoke with care manager that the patient may need oxygen upon discharge.  Code Status:     Code Status Orders        Start     Ordered   11/01/15 1242  Full code   Continuous     11/01/15 1241     Disposition Plan: Home potentially tomorrow  Antibiotics:  rocephin  Time spent: 20 minutes  Alford HighlandWIETING, Amato Sevillano  [facility]

## 2015-11-06 LAB — CREATININE, SERUM
CREATININE: 0.55 mg/dL (ref 0.44–1.00)
GFR calc Af Amer: >60 mL/min (ref >60)

## 2015-11-06 LAB — CULTURE, BLOOD (ROUTINE X 2): CULTURE: NO GROWTH

## 2015-11-06 MED ORDER — POTASSIUM CHLORIDE ER 10 MEQ PO TBCR: 10.0000 meq | EXTENDED_RELEASE_TABLET | Freq: Two times a day (BID) | ORAL | Status: DC

## 2015-11-06 MED ORDER — LISINOPRIL 20 MG PO TABS: 20.0000 mg | ORAL_TABLET | Freq: Every day | ORAL | Status: DC

## 2015-11-06 MED ORDER — NYSTATIN-TRIAMCINOLONE 100000-0.1 UNIT/GM-% EX OINT: TOPICAL_OINTMENT | Freq: Two times a day (BID) | CUTANEOUS | Status: DC

## 2015-11-06 MED ORDER — FUROSEMIDE 40 MG PO TABS: 40.0000 mg | ORAL_TABLET | Freq: Two times a day (BID) | ORAL | Status: DC

## 2015-11-06 MED ORDER — CLINDAMYCIN HCL 150 MG PO CAPS: ORAL_CAPSULE | ORAL | Status: DC

## 2015-11-06 MED ORDER — LISINOPRIL 20 MG PO TABS
20.0000 mg | ORAL_TABLET | Freq: Every day | ORAL | Status: DC
  Administered 2015-11-06: 20 mg via ORAL
  Filled 2015-11-06: qty 1

## 2015-11-06 NOTE — Progress Notes (Signed)
Alert and oriented. VSS. No signs of acute distress. Discharge instructions given. Patient verbalized understanding. 

## 2015-11-06 NOTE — Discharge Summary (Signed)
[facility] [place] at [facility]   PATIENT NAME: Bailey ShiverLinda Huerta    MR#:  [id number]  DATE OF BIRTH:  05/17/1961  DATE OF ADMISSION:  11/01/2015 ADMITTING PHYSICIAN: Enid Baasadhika Kalisetti, MD  DATE OF DISCHARGE: 11/06/2015  PRIMARY CARE PHYSICIAN: [facility]   ADMISSION DIAGNOSIS:  Cellulitis of chest wall [L03.313]  DISCHARGE DIAGNOSIS:  Active Problems:   Cellulitis   Cellulitis of left arm   SECONDARY DIAGNOSIS:   Past Medical History  Diagnosis Date  . Hypertension   . Sleep apnea   . Morbid obesity (HCC)   . GERD (gastroesophageal reflux disease)   . Depression   . Lymphedema     HOSPITAL COURSE:   1. Cellulitis left upper arm and left breast. Patient was initially started on IV vancomycin and Zosyn. Switched over to Rocephin. Upon discharge switched over to by mouth clindamycin. Cellulitis has improved and further treatment as outpatient. 2. Acute respiratory failure now chronic. Pulse ox dropping down to 76% with ambulation and even pulse ox at rest 88%. Patient will be set up for oxygen at home. Likely a combination of restrictive lung disease with morbid obesity and congestive heart failure.  3. Acute diastolic congestive heart failure. Patient was given Lasix twice a day during the hospital course and diuresis well. She is on atenolol and lisinopril as outpatient already. Echocardiogram can be set up as outpatient. Recommend an outpatient sleep study to check for sleep apnea and prescribe CPAP if needed. Oxygen 24 7 will also help. 4. Morbid obesity weight loss needed 5. Weakness physical therapy recommended rehabilitation patient wants to go home with home health. 6. Essential hypertension- continue medications as ordered 7. Fungal infection on the breast- continue fungal treatment on the breast.   DISCHARGE CONDITIONS:   Satisfactory  CONSULTS OBTAINED:  None  DRUG ALLERGIES:  No Known Allergies  DISCHARGE MEDICATIONS:   Current  Discharge Medication List    START taking these medications   Details  clindamycin (CLEOCIN) 150 MG capsule 3 tablets three times a day until completed Qty: 63 capsule, Refills: 0    furosemide (LASIX) 40 MG tablet Take 1 tablet (40 mg total) by mouth 2 (two) times daily. Qty: 60 tablet, Refills: 0    nystatin-triamcinolone ointment (MYCOLOG) Apply topically 2 (two) times daily. Qty: 30 g, Refills: 0    potassium chloride (K-DUR) 10 MEQ tablet Take 1 tablet (10 mEq total) by mouth 2 (two) times daily. Qty: 60 tablet, Refills: 0      CONTINUE these medications which have CHANGED   Details  lisinopril (PRINIVIL,ZESTRIL) 20 MG tablet Take 1 tablet (20 mg total) by mouth daily. Qty: 30 tablet, Refills: 0      CONTINUE these medications which have NOT CHANGED   Details  atenolol (TENORMIN) 50 MG tablet Take 50 mg by mouth 2 (two) times daily.    cloNIDine (CATAPRES-TTS-2) 0.2 mg/24hr patch Place 0.2 mg onto the skin once a week. On Thursday      STOP taking these medications     ketoconazole (NIZORAL) 2 % cream          DISCHARGE INSTRUCTIONS:   [facility]  If  you experience worsening of your admission symptoms, develop shortness of breath, life threatening emergency, suicidal or homicidal thoughts you must seek medical attention immediately by calling 911 or calling your MD immediately  if symptoms less severe.  You Must read complete instructions/literature along with all the possible  adverse reactions/side effects for all the Medicines you take and that have been prescribed to you. Take any new Medicines after you have completely understood and accept all the possible adverse reactions/side effects.   Please note  You were cared for by a hospitalist during your hospital stay. If you have any questions about your discharge medications or the care you received while you were in the hospital after you are discharged, you can call  the unit and asked to speak with the hospitalist on call if the hospitalist that took care of you is not available. Once you are discharged, your primary care physician will handle any further medical issues. Please note that NO REFILLS for any discharge medications will be authorized once you are discharged, as it is imperative that you return to your primary care physician (or establish a relationship with a primary care physician if you do not have one) for your aftercare needs so that they can reassess your need for medications and monitor your lab values.    Today   CHIEF COMPLAINT:   Chief Complaint  Patient presents with  . Fatigue    HISTORY OF PRESENT ILLNESS:  [name]  is a 54 y.o. female with a known history of morbid obesity presented with fatigue found to have a left breast cellulitis left upper arm cellulitis   VITAL SIGNS:  Blood pressure 130/58, pulse 73, temperature 98.4 F (36.9 C), temperature source Oral, resp. rate 24, height  (1.575 m), weight 208.655 kg (460 lb), SpO2 76 %.  I/O:    Intake/Output Summary (Last 24 hours) at 11/06/15 0841 Last data filed at 11/06/15 0558  Gross per 24 hour  Intake    600 ml  Output      0 ml  Net    600 ml    PHYSICAL EXAMINATION:  GENERAL:  54 y.o.-year-old patient lying in the bed with no acute distress.  EYES: Pupils equal, round, reactive to light and accommodation. No scleral icterus. Extraocular muscles intact.  HEENT: Head atraumatic, normocephalic. Oropharynx and nasopharynx clear.  NECK:  Supple, no jugular venous distention. No thyroid enlargement, no tenderness.  LUNGS: Normal breath sounds bilaterally, no wheezing, rales,rhonchi or crepitation. No use of accessory muscles of respiration.  CARDIOVASCULAR: S1, S2 normal. No murmurs, rubs, or gallops.  ABDOMEN: Soft, non-tender, non-distended. Bowel sounds present. No organomegaly or mass.  EXTREMITIES:  4+ edema,  no cyanosis, or clubbing.  NEUROLOGIC:  Cranial nerves II through XII are intact. Muscle strength 5/5 in all extremities. Sensation intact. Gait not checked.  PSYCHIATRIC: The patient is alert and oriented x 3.  SKIN:  left breast erythema and left arm erythema fading. Still with some increased warmth around the area.   DATA REVIEW:   CBC  Recent Labs Lab 11/02/15 0525  WBC 6.8  HGB 13.9  HCT 43.0  PLT 178    Chemistries   Recent Labs Lab 11/03/15 0340 11/06/15 0459  NA 143  --   K 4.2  --   CL 96*  --   CO2 39*  --   GLUCOSE 94  --   BUN 8  --   CREATININE 0.67 0.55  CALCIUM 8.6*  --     Microbiology Results  Results for orders placed or performed during the hospital encounter of 11/01/15  Culture, blood (routine x 2)     Status: None (Preliminary result)   Collection Time: 11/01/15  9:00 AM  Result Value Ref Range Status  Specimen Description BLOOD RIGHT ARM  Final   Special Requests   Final    BOTTLES DRAWN AEROBIC AND ANAEROBIC  1CC AERO 2 CC ANAERO   Culture NO GROWTH 4 DAYS  Final   Report Status PENDING  Incomplete  Culture, blood (routine x 2)     Status: None   Collection Time: 11/01/15  9:08 AM  Result Value Ref Range Status   Specimen Description BLOOD RIGHT UPPER ARM  Final   Special Requests BOTTLES DRAWN AEROBIC AND ANAEROBIC  1CC  Final   Culture  Setup Time   Final    GRAM VARIABLE COCCI ANAEROBIC BOTTLE ONLY CRITICAL RESULT CALLED TO, READ BACK BY AND VERIFIED WITH: VERNICIA GRAVES AT 0640 11/02/15 DV    Culture   Final    COAGULASE NEGATIVE STAPHYLOCOCCUS Results consistent with contamination.    Report Status 11/06/2015 FINAL  Final  Culture, blood (routine x 2)     Status: None (Preliminary result)   Collection Time: 11/02/15 12:50 PM  Result Value Ref Range Status   Specimen Description BLOOD LEFT HAND  Final   Special Requests   Final    BOTTLES DRAWN AEROBIC AND ANAEROBIC  8CC AERO, 7CC ANAERO   Culture NO GROWTH 3 DAYS  Final   Report Status PENDING  Incomplete   Culture, blood (routine x 2)     Status: None (Preliminary result)   Collection Time: 11/02/15  1:06 PM  Result Value Ref Range Status   Specimen Description BLOOD RIGHT HAND  Final   Special Requests   Final    BOTTLES DRAWN AEROBIC AND ANAEROBIC  8CC AERO, 6CC ANAERO   Culture NO GROWTH 3 DAYS  Final   Report Status PENDING  Incomplete    Management plans discussed with the patient, and she is in agreement.  CODE STATUS:     Code Status Orders        Start     Ordered   11/01/15 1242  Full code   Continuous     11/01/15 1241      TOTAL TIME TAKING CARE OF THIS PATIENT: 35 minutes.    Alford Highland M.D on 11/06/2015 at 8:41 AM  Between 7am to 6pm - Pager - [phone]  After 6pm go to [email or web address] - password [email or web address] [facility]  [facility]  Office  [phone]  CC: Primary care physician; [facility]

## 2015-11-06 NOTE — Discharge Instructions (Signed)

## 2015-11-06 NOTE — Care Management (Signed)
Orders placed for BSC and O2. DME will be provided by [facility] prior to discharge.

## 2015-11-06 NOTE — Progress Notes (Signed)
   11/06/15 0755  Oxygen Therapy  SpO2 (!) 88 % (84-88,lying,at rest)  O2 Device Room [id number]

## 2015-11-07 LAB — CULTURE, BLOOD (ROUTINE X 2)
CULTURE: NO GROWTH
Culture: NO GROWTH

## 2015-11-21 ENCOUNTER — Ambulatory Visit: Payer: Medicaid Other | Admitting: Family

## 2015-11-30 ENCOUNTER — Encounter: Payer: Self-pay | Admitting: Family

## 2015-11-30 ENCOUNTER — Ambulatory Visit: Payer: Medicaid Other | Attending: Family | Admitting: Family

## 2015-11-30 VITALS — BP 140/92 | HR 61 | Resp 18 | Ht 62.0 in | Wt >= 6400 oz

## 2015-11-30 DIAGNOSIS — F329 Major depressive disorder, single episode, unspecified: Secondary | ICD-10-CM | POA: Insufficient documentation

## 2015-11-30 DIAGNOSIS — I890 Lymphedema, not elsewhere classified: Secondary | ICD-10-CM | POA: Insufficient documentation

## 2015-11-30 DIAGNOSIS — Z8249 Family history of ischemic heart disease and other diseases of the circulatory system: Secondary | ICD-10-CM | POA: Insufficient documentation

## 2015-11-30 DIAGNOSIS — G4730 Sleep apnea, unspecified: Secondary | ICD-10-CM | POA: Diagnosis not present

## 2015-11-30 DIAGNOSIS — I509 Heart failure, unspecified: Secondary | ICD-10-CM | POA: Diagnosis present

## 2015-11-30 DIAGNOSIS — G4733 Obstructive sleep apnea (adult) (pediatric): Secondary | ICD-10-CM | POA: Insufficient documentation

## 2015-11-30 DIAGNOSIS — K219 Gastro-esophageal reflux disease without esophagitis: Secondary | ICD-10-CM | POA: Diagnosis not present

## 2015-11-30 DIAGNOSIS — I5032 Chronic diastolic (congestive) heart failure: Secondary | ICD-10-CM | POA: Insufficient documentation

## 2015-11-30 DIAGNOSIS — Z79899 Other long term (current) drug therapy: Secondary | ICD-10-CM | POA: Insufficient documentation

## 2015-11-30 DIAGNOSIS — I10 Essential (primary) hypertension: Secondary | ICD-10-CM | POA: Diagnosis not present

## 2015-11-30 NOTE — Patient Instructions (Addendum)
Ask primary care provider to refill Klor-Con and Lisinopril.

## 2015-11-30 NOTE — Progress Notes (Signed)
Subjective:    Patient ID: [name], female    DOB: 05-05-1961, 54 y.o.   MRN: [id number]  Congestive Heart Failure Presents for initial visit. The disease course has been stable. Associated symptoms include chest pressure (2 days ago), edema, fatigue and shortness of breath. Pertinent negatives include no abdominal pain, chest pain, orthopnea or palpitations. The symptoms have been stable. Past treatments include ACE inhibitors, beta blockers, oxygen and salt and fluid restriction. The treatment provided moderate relief. Compliance with prior treatments has been good. Her past medical history is significant for HTN. Compliance with total regimen is 76-100%. Compliance problems include adherence to exercise.   Hypertension This is a chronic problem. The current episode started more than 1 year ago. The problem is unchanged. Associated symptoms include peripheral edema and shortness of breath. Pertinent negatives include no blurred vision, chest pain, headaches, neck pain or palpitations. There are no associated agents to hypertension. Risk factors for coronary artery disease include family history, obesity and sedentary lifestyle. Past treatments include ACE inhibitors, beta blockers, diuretics and lifestyle changes. The current treatment provides moderate improvement. Compliance problems include exercise.  Hypertensive end-organ damage includes heart failure.    Past Medical History  Diagnosis Date  . Hypertension   . Sleep apnea   . Morbid obesity (HCC)   . GERD (gastroesophageal reflux disease)   . Depression   . Lymphedema     History reviewed. No pertinent past surgical history.  Family History  Problem Relation Age of Onset  . CVA Father   . Hypertension Father   . Hypertension Mother     Social History  Substance Use Topics  . Smoking status: Never Smoker   . Smokeless tobacco: Never Used  . Alcohol Use: 0.0 oz/week    0 Standard drinks or equivalent per week   Comment: occasional alcohol use    No Known Allergies  Prior to Admission medications   Medication Sig Start Date End Date Taking? Authorizing Provider  atenolol (TENORMIN) 50 MG tablet Take 50 mg by mouth 2 (two) times daily.   Yes Historical Provider, MD  cloNIDine (CATAPRES-TTS-2) 0.2 mg/24hr patch Place 0.2 mg onto the skin once a week. On Thursday   Yes Historical Provider, MD  furosemide (LASIX) 40 MG tablet Take 1 tablet (40 mg total) by mouth 2 (two) times daily. 11/06/15  Yes Richard Renae GlossWieting, MD  lisinopril (PRINIVIL,ZESTRIL) 20 MG tablet Take 1 tablet (20 mg total) by mouth daily. 11/06/15  Yes Alford Highlandichard Wieting, MD  nystatin-triamcinolone ointment [facility](MYCOLOG) Apply topically 2 (two) times daily. 11/06/15  Yes Alford Highlandichard Wieting, MD  potassium chloride (K-DUR) 10 MEQ tablet Take 1 tablet (10 mEq total) by mouth 2 (two) times daily. 11/06/15  Yes Alford Highlandichard Wieting, MD     Review of Systems  Constitutional: Positive for fatigue. Negative for appetite change.  HENT: Positive for rhinorrhea. Negative for congestion and sore throat.   Eyes: Negative.  Negative for blurred vision.  Respiratory: Positive for chest tightness (2 days ago; resolved on its own) and shortness of breath. Negative for cough and wheezing.   Cardiovascular: Positive for leg swelling (chronic). Negative for chest pain and palpitations.  Gastrointestinal: Negative for abdominal pain and abdominal distention.  Endocrine: Negative.   Genitourinary: Negative.   Musculoskeletal: Positive for arthralgias (knees). Negative for back pain and neck pain.  Skin: Negative.   Allergic/Immunologic: Negative.   Neurological: Positive for light-headedness (at times). Negative for dizziness and headaches.  Hematological: Negative for adenopathy. Does not  bruise/bleed easily.  Psychiatric/Behavioral: Negative for sleep disturbance (sleeping on 2 pillows) and dysphoric mood. The patient is not nervous/anxious.        Objective:    Physical Exam  Constitutional: She is oriented to person, place, and time. She appears well-developed and well-nourished.  HENT:  Head: Normocephalic and atraumatic.  Eyes: Right eye exhibits no discharge. Left eye exhibits no discharge.  Neck: Normal range of motion. Neck supple.  Cardiovascular: Normal rate and regular rhythm.   Pulmonary/Chest: Effort normal. She has no wheezes. She has no rales.  Abdominal: Soft. She exhibits no distension. There is no tenderness.  Musculoskeletal: She exhibits edema (lymphedema present bilateral lower legs). She exhibits no tenderness.  Neurological: She is alert and oriented to person, place, and time.  Skin: Skin is warm and dry.  Psychiatric: She has a normal mood and affect. Her behavior is normal. Thought content normal.  Nursing note and vitals reviewed.   BP 140/92 mmHg  Pulse 61  Resp 18  Ht  (1.575 m)  Wt 460 lb (208.655 kg)  BMI 84.11 kg/m2  SpO2 100%       Assessment & Plan:  1: Chronic heart failure with presumed preserved ejection fraction- Patient presents with fatigue and shortness of breath upon exertion. She says that she does experience symptoms when getting dressed or bathed which is why she needs assistance doing those things. She does use a walker to get around at home. She does get weighed every week when the home health nurse comes as she doesn't want to stand on the scales without assistance. Discussed the importance of weighing daily with assistance so that she can call for an overnight weight gain of >2 pounds or a weekly weight gain of >5 pounds. She is not adding any salt to her food nor does she cook with salt. Written information was given to her regarding a  sodium diet. Patient does not have a cardiologist so a new patient appointment was scheduled for her. She also needs an echocardiogram done. Patient has already received a flu vaccine this season. 2: HTN- Blood pressure looks good today. Continue  medications at this time. 3: Obstructive sleep apnea- Patient does wear oxygen at 2L around the clock. She says that she had a sleep study done about 5 years ago but that her equipment is that old and she doesn't wear it anymore. Discussed re-ordering a sleep study and she says that she will talk with her PCP at her visit on 12/03/15. If the PCP would rather [name] order the sleep study, patient is to just call us and let us know. 4: Lymphedema- This is chronic in nature. She was followed by the wound clinic in the past and had her legs wrapped but she says that the wraps wouldn't stay up in place. She denies any open wounds at the moment.   Return in 1 month or sooner for any questions/problems before then.

## 2016-01-02 ENCOUNTER — Ambulatory Visit: Payer: Medicaid Other | Admitting: Family

## 2016-01-08 ENCOUNTER — Ambulatory Visit: Payer: Medicaid Other | Admitting: Nurse Practitioner

## 2016-01-11 ENCOUNTER — Encounter: Payer: Self-pay | Admitting: Family

## 2016-01-11 ENCOUNTER — Ambulatory Visit: Payer: Medicaid Other | Attending: Family | Admitting: Family

## 2016-01-11 VITALS — BP 136/78 | HR 74 | Resp 20 | Ht 62.0 in | Wt >= 6400 oz

## 2016-01-11 DIAGNOSIS — G4733 Obstructive sleep apnea (adult) (pediatric): Secondary | ICD-10-CM | POA: Insufficient documentation

## 2016-01-11 DIAGNOSIS — F329 Major depressive disorder, single episode, unspecified: Secondary | ICD-10-CM | POA: Insufficient documentation

## 2016-01-11 DIAGNOSIS — K219 Gastro-esophageal reflux disease without esophagitis: Secondary | ICD-10-CM | POA: Diagnosis not present

## 2016-01-11 DIAGNOSIS — I110 Hypertensive heart disease with heart failure: Secondary | ICD-10-CM | POA: Diagnosis not present

## 2016-01-11 DIAGNOSIS — Z79899 Other long term (current) drug therapy: Secondary | ICD-10-CM | POA: Insufficient documentation

## 2016-01-11 DIAGNOSIS — I5032 Chronic diastolic (congestive) heart failure: Secondary | ICD-10-CM | POA: Insufficient documentation

## 2016-01-11 DIAGNOSIS — I10 Essential (primary) hypertension: Secondary | ICD-10-CM

## 2016-01-11 DIAGNOSIS — I890 Lymphedema, not elsewhere classified: Secondary | ICD-10-CM | POA: Diagnosis not present

## 2016-01-11 DIAGNOSIS — Z6841 Body Mass Index (BMI) 40.0 and over, adult: Secondary | ICD-10-CM | POA: Diagnosis not present

## 2016-01-11 NOTE — Progress Notes (Signed)
Subjective:    Patient ID: [name], female    DOB: 25-Nov-1961, 55 y.o.   MRN: [id number]  Congestive Heart Failure Presents for follow-up visit. The disease course has been stable. Associated symptoms include edema and fatigue. Pertinent negatives include no abdominal pain, chest pain, chest pressure, orthopnea, palpitations or shortness of breath. The symptoms have been improving. Past treatments include ACE inhibitors, beta blockers, oxygen and salt and fluid restriction. The treatment provided moderate relief. Compliance with prior treatments has been good. Her past medical history is significant for HTN. She has one 1st degree relative with heart disease. Compliance with total regimen is 76-100%. Compliance problems include adherence to exercise.   Hypertension This is a chronic problem. The current episode started more than 1 year ago. The problem has been waxing and waning since onset. Associated symptoms include peripheral edema. Pertinent negatives include no chest pain, headaches, neck pain, palpitations or shortness of breath. Risk factors for coronary artery disease include family history, obesity and sedentary lifestyle. Past treatments include ACE inhibitors, beta blockers, diuretics, lifestyle changes and alpha 1 blockers. The current treatment provides moderate improvement. Compliance problems include exercise.  Hypertensive end-organ damage includes heart failure.   Past Medical History  Diagnosis Date  . Hypertension   . Sleep apnea   . Morbid obesity (HCC)   . GERD (gastroesophageal reflux disease)   . Depression   . Lymphedema     History reviewed. No pertinent past surgical history.  Family History  Problem Relation Age of Onset  . CVA Father   . Hypertension Father   . Hypertension Mother     Social History  Substance Use Topics  . Smoking status: Never Smoker   . Smokeless tobacco: Never Used  . Alcohol Use: 0.0 oz/week    0 Standard drinks or  equivalent per week     Comment: occasional alcohol use    No Known Allergies  Prior to Admission medications   Medication Sig Start Date End Date Taking? Authorizing Provider  atenolol (TENORMIN) 50 MG tablet Take 50 mg by mouth 2 (two) times daily.   Yes Historical Provider, MD  cloNIDine (CATAPRES-TTS-2) 0.2 mg/24hr patch Place 0.2 mg onto the skin once a week. On Thursday   Yes Historical Provider, MD  furosemide (LASIX) 40 MG tablet Take 1 tablet (40 mg total) by mouth 2 (two) times daily. Patient taking differently: Take 80 mg by mouth daily.  11/06/15  Yes Richard Renae Gloss, MD  lisinopril (PRINIVIL,ZESTRIL) 20 MG tablet Take 1 tablet (20 mg total) by mouth daily. 11/06/15  Yes Alford Highland, MD  nystatin-triamcinolone ointment [facility]) Apply topically 2 (two) times daily. 11/06/15  Yes Alford Highland, MD  potassium chloride (K-DUR) 10 MEQ tablet Take 1 tablet (10 mEq total) by mouth 2 (two) times daily. 11/06/15  Yes Alford Highland, MD      Review of Systems  Constitutional: Positive for fatigue. Negative for appetite change.  HENT: Positive for rhinorrhea. Negative for congestion and sore throat.   Eyes: Negative.   Respiratory: Negative for cough, chest tightness, shortness of breath and wheezing.   Cardiovascular: Positive for leg swelling. Negative for chest pain and palpitations.  Gastrointestinal: Negative for abdominal pain and abdominal distention.  Endocrine: Negative.   Genitourinary: Negative.   Musculoskeletal: Negative for back pain and neck pain.  Skin: Negative.   Allergic/Immunologic: Negative.   Neurological: Negative for dizziness, light-headedness and headaches.  Hematological: Negative for adenopathy. Does not bruise/bleed easily.  Psychiatric/Behavioral: Negative for  sleep disturbance (sleeping on 2 pillows. oxygen at 2L) and dysphoric mood. The patient is not nervous/anxious.        Objective:   Physical Exam  Constitutional: She is oriented to  person, place, and time. She appears well-developed and well-nourished.  HENT:  Head: Normocephalic and atraumatic.  Eyes: Conjunctivae are normal. Pupils are equal, round, and reactive to light.  Neck: Normal range of motion. Neck supple.  Cardiovascular: Normal rate and regular rhythm.   Pulmonary/Chest: Effort normal. She has no wheezes. She has no rales.  Abdominal: Soft. She exhibits no distension. There is no tenderness.  Musculoskeletal: She exhibits edema (lymphedema present bilateral lower legs). She exhibits no tenderness.  Neurological: She is alert and oriented to person, place, and time.  Skin: Skin is warm and dry.  Psychiatric: She has a normal mood and affect. Her behavior is normal. Thought content normal.  Nursing note and vitals reviewed.   BP 136/78 mmHg  Pulse 74  Resp 20  Ht  (1.575 m)  Wt 435 lb (197.315 kg)  BMI 79.54 kg/m2  SpO2 94%       Assessment & Plan:  1: Chronic heart failure with preserved ejection fraction- Patient presents with some fatigue on exertion. She says that she does walk a little bit in the house and when she starts to feel tired, she will sit down to rest. She denies any shortness of breath. Has chronic edema in her lower legs. She gets weighed on a weekly basis by the home health nurse and her most recent weight was 435 pounds and her weight prior to that was 460 pounds. She does admit to drinking 64 ounces of fluid daily and she was instructed to decrease that to 48 ounces of fluid daily.  She is not adding any salt to her food and is trying to eat low sodium foods. [facility] PharmD went in and reviewed medications with her. She has a cardiology appointment in February 2017. 2: HTN- Blood pressure looks good today. Continue medications at this time. 3: Obstructive sleep apnea- Patient says that she's currently waiting to hear about getting a sleep study done that was ordered by her PCP. Does have oxygen that she wears at 2L at bedtime and  as needed. 4: Lymphedema- Legs look better from the last time she was here. Sees her PCP March 2017.  Return here in 3 months or as needed for any questions/problems before then.

## 2016-01-11 NOTE — Patient Instructions (Signed)
Continue weighing on a weekly basis with home health nurse

## 2016-01-16 ENCOUNTER — Telehealth: Payer: Self-pay

## 2016-01-16 MED ORDER — METOLAZONE 2.5 MG PO TABS: 2.5000 mg | ORAL_TABLET | Freq: Every day | ORAL | Status: DC

## 2016-01-16 NOTE — Telephone Encounter (Signed)
Please call Melinda back with the following medication changes:  Continue furosemide  daily. Add metolazone 2.5mg  daily for the next 3 days only. Best to give it 30 minutes prior to furosemide dose if possible. Take an additional potassium tablet with the metolazone for next 3 days.   Draw a basic metabolicSchuermannel on Friday 01/18/16 and fax to [name].   Additional weight on 01/18/16.

## 2016-01-16 NOTE — Telephone Encounter (Signed)
Spoke with Bartel about changes she understands the changes and will follow up with blood work and additional weight check on Friday.

## 2016-01-16 NOTE — Telephone Encounter (Signed)
Melinda with [facility] called, she states the pt feels like she is holding fluid in her abdomen and her weight is increasing.  440.4 today 435.4 1/18 432.8 1/13 432.8 1/5 She is currently taking furosemide 80 mg daily. [facility] would like for you to advise.

## 2016-02-08 ENCOUNTER — Telehealth: Payer: Self-pay

## 2016-02-08 NOTE — Telephone Encounter (Signed)
[facility]inda with [facility] called, Ms. Shaws weight is up again from 423.8 last week to 432.4 this week. Dino would like to know if they could do another round of metolazone.   She is also curious if you would consider adding Metolazone 2.5mg  once weekly to mer medication regimen to help keep her weight more stable.  Her last blood work after the most recent use of Metolazone ( 01/22/2016) showed her Creatinine at 1.24 and potassium was 3.9.

## 2016-02-08 NOTE — Telephone Encounter (Signed)
She can take metolazone 2.5mg  daily for the next 3 days and then begin taking it once a week. When she takes the metolazone, she needs to take an additional potassium tablet. Please ask them to recheck lab work in 2 weeks and fax to [name].

## 2016-02-08 NOTE — Telephone Encounter (Signed)
Spoke with [place] and conformed medication change. She understands the change and will redraw labs in 2 weeks.

## 2016-02-11 ENCOUNTER — Ambulatory Visit: Payer: Medicaid Other | Admitting: Cardiovascular Disease

## 2016-02-14 ENCOUNTER — Ambulatory Visit: Payer: Medicaid Other | Admitting: Cardiovascular Disease

## 2016-02-14 ENCOUNTER — Other Ambulatory Visit: Payer: Self-pay | Admitting: Family

## 2016-02-14 MED ORDER — POTASSIUM CHLORIDE ER 10 MEQ PO TBCR: 10.0000 meq | EXTENDED_RELEASE_TABLET | Freq: Two times a day (BID) | ORAL | Status: DC

## 2016-02-14 MED ORDER — METOLAZONE 2.5 MG PO TABS: 2.5000 mg | ORAL_TABLET | ORAL | Status: DC

## 2016-02-20 ENCOUNTER — Other Ambulatory Visit: Payer: Self-pay | Admitting: Family

## 2016-02-20 MED ORDER — METOLAZONE 2.5 MG PO TABS: 2.5000 mg | ORAL_TABLET | Freq: Every day | ORAL | Status: DC

## 2016-02-27 ENCOUNTER — Telehealth: Payer: Self-pay | Admitting: Family

## 2016-02-27 NOTE — Telephone Encounter (Signed)
Malinda from [facility] called to say that patient's weight is down 5 pounds from last week when she was there last week. She hasn't taken the 3 days in a row of the metolazone due to patient "going through some things" so she was wondering if she needed to do the 3 days in a row since her weight is down. Advised Malinda to not do the 3 days in a row and just have patient take the metolazone on a weekly basis. She will fax us the lab work when it's done.

## 2016-03-26 ENCOUNTER — Encounter: Payer: Medicaid Other | Admitting: Cardiovascular Disease

## 2016-03-26 ENCOUNTER — Encounter: Payer: Self-pay | Admitting: Cardiovascular Disease

## 2016-03-26 ENCOUNTER — Encounter: Payer: Self-pay | Admitting: *Deleted

## 2016-04-08 ENCOUNTER — Telehealth: Payer: Self-pay | Admitting: Cardiovascular Disease

## 2016-04-08 NOTE — Telephone Encounter (Signed)
Ex 3111 °Nurse Lori from [facility] °Calling to follow up on orders they sent over for Dr Gollan to sign  °Please call back  °

## 2016-04-10 ENCOUNTER — Encounter: Payer: Self-pay | Admitting: Family

## 2016-04-10 ENCOUNTER — Ambulatory Visit: Payer: Medicaid Other | Attending: Family | Admitting: Family

## 2016-04-10 VITALS — BP 142/90 | HR 72 | Resp 20 | Ht 62.0 in | Wt >= 6400 oz

## 2016-04-10 DIAGNOSIS — R0602 Shortness of breath: Secondary | ICD-10-CM | POA: Diagnosis present

## 2016-04-10 DIAGNOSIS — I890 Lymphedema, not elsewhere classified: Secondary | ICD-10-CM | POA: Diagnosis not present

## 2016-04-10 DIAGNOSIS — G4733 Obstructive sleep apnea (adult) (pediatric): Secondary | ICD-10-CM | POA: Diagnosis not present

## 2016-04-10 DIAGNOSIS — Z79899 Other long term (current) drug therapy: Secondary | ICD-10-CM | POA: Insufficient documentation

## 2016-04-10 DIAGNOSIS — F329 Major depressive disorder, single episode, unspecified: Secondary | ICD-10-CM | POA: Diagnosis not present

## 2016-04-10 DIAGNOSIS — I110 Hypertensive heart disease with heart failure: Secondary | ICD-10-CM | POA: Diagnosis not present

## 2016-04-10 DIAGNOSIS — Z823 Family history of stroke: Secondary | ICD-10-CM | POA: Diagnosis not present

## 2016-04-10 DIAGNOSIS — I10 Essential (primary) hypertension: Secondary | ICD-10-CM

## 2016-04-10 DIAGNOSIS — I5032 Chronic diastolic (congestive) heart failure: Secondary | ICD-10-CM | POA: Diagnosis present

## 2016-04-10 DIAGNOSIS — K219 Gastro-esophageal reflux disease without esophagitis: Secondary | ICD-10-CM | POA: Insufficient documentation

## 2016-04-10 MED ORDER — METOLAZONE 2.5 MG PO TABS: 2.5000 mg | ORAL_TABLET | ORAL | Status: DC

## 2016-04-10 NOTE — Patient Instructions (Signed)
Continue weighing weekly and call for a 5 pound or greater weight gain in that week.

## 2016-04-10 NOTE — Progress Notes (Signed)
Subjective:    Patient ID: Bailey HurlLinda J. [name], female    DOB: 08-26-1961, 55 y.o.   MRN: [id number]  Congestive Heart Failure Presents for follow-up visit. The disease course has been worsening. Associated symptoms include edema, fatigue, shortness of breath and unexpected weight change. Pertinent negatives include no abdominal pain, chest pain, muscle weakness, orthopnea or palpitations. The symptoms have been worsening. Past treatments include salt and fluid restriction, oxygen, beta blockers and ACE inhibitors. The treatment provided moderate relief. Compliance with prior treatments has been variable. Prior compliance problems include difficulty with treatment plan. Her past medical history is significant for HTN. There is no history of CAD or DM.  Hypertension This is a chronic problem. The current episode started more than 1 year ago. The problem has been waxing and waning since onset. Associated symptoms include headaches (little bit), peripheral edema and shortness of breath. Pertinent negatives include no chest pain or palpitations. There are no associated agents to hypertension. Risk factors for coronary artery disease include obesity and sedentary lifestyle. Past treatments include ACE inhibitors, beta blockers, diuretics and lifestyle changes. The current treatment provides mild improvement. Compliance problems include exercise.  Hypertensive end-organ damage includes heart failure.    Past Medical History  Diagnosis Date  . Hypertension   . Sleep apnea   . Morbid obesity (HCC)   . GERD (gastroesophageal reflux disease)   . Depression   . Lymphedema     History reviewed. No pertinent past surgical history.  Family History  Problem Relation Age of Onset  . CVA Father   . Hypertension Father   . Hypertension Mother     Social History  Substance Use Topics  . Smoking status: Never Smoker   . Smokeless tobacco: Never Used  . Alcohol Use: 0.0 oz/week    0 Standard drinks or  equivalent per week     Comment: occasional alcohol use    No Known Allergies  Prior to Admission medications   Medication Sig Start Date End Date Taking? Authorizing Provider  atenolol (TENORMIN) 50 MG tablet Take 50 mg by mouth 2 (two) times daily.   Yes Historical Provider, MD  cloNIDine (CATAPRES-TTS-2) 0.2 mg/24hr patch Place 0.2 mg onto the skin once a week. On Thursday   Yes Historical Provider, MD  furosemide (LASIX) 40 MG tablet Take 80 mg by mouth daily.   Yes Historical Provider, MD  lisinopril (PRINIVIL,ZESTRIL) 20 MG tablet Take 1 tablet (20 mg total) by mouth daily. 11/06/15  Yes Alford Highlandichard Wieting, MD  metolazone (ZAROXOLYN) 2.5 MG tablet Take 1 tablet (2.5 mg total) by mouth once a week. 04/10/16  Yes Delma Freezeina A Hackney, FNP  nystatin-triamcinolone ointment [facility](MYCOLOG) Apply topically 2 (two) times daily. 11/06/15  Yes Alford Highlandichard Wieting, MD  potassium chloride (K-DUR) 10 MEQ tablet Take 1 tablet (10 mEq total) by mouth 2 (two) times daily. And take an additional tablet weekly when taking the metolazone 02/14/16  Yes Delma Freezeina A Hackney, FNP     Review of Systems  Constitutional: Positive for fatigue and unexpected weight change. Negative for appetite change.  HENT: Negative for congestion, postnasal drip and sore throat.   Eyes: Negative.   Respiratory: Positive for cough (little bit) and shortness of breath. Negative for chest tightness.   Cardiovascular: Positive for leg swelling. Negative for chest pain and palpitations.  Gastrointestinal: Negative for abdominal pain and abdominal distention.  Endocrine: Negative.   Genitourinary: Negative.   Musculoskeletal: Positive for arthralgias (bilateral knee pain). Negative for back pain and  muscle weakness.  Skin: Negative.   Allergic/Immunologic: Negative.   Neurological: Positive for light-headedness and headaches (little bit). Negative for dizziness.  Hematological: Negative for adenopathy. Does not bruise/bleed easily.   Psychiatric/Behavioral: Positive for sleep disturbance (trouble sleeping due to no air conditioning; wears oxygen on 2 L). Negative for dysphoric mood. The patient is not nervous/anxious.        Objective:   Physical Exam  Constitutional: She is oriented to person, place, and time. She appears well-developed and well-nourished.  HENT:  Head: Normocephalic and atraumatic.  Eyes: Conjunctivae are normal. Pupils are equal, round, and reactive to light.  Neck: Normal range of motion. Neck supple.  Cardiovascular: Normal rate and regular rhythm.   Pulmonary/Chest: Effort normal. She has no wheezes. She has no rales.  Abdominal: Soft. She exhibits no distension. There is no tenderness.  Musculoskeletal: She exhibits edema. She exhibits no tenderness.  Neurological: She is alert and oriented to person, place, and time.  Skin: Skin is warm and dry.  Psychiatric: She has a normal mood and affect. Her behavior is normal. Thought content normal.  Nursing note and vitals reviewed.   BP 142/90 mmHg  Pulse 72  Resp 20  Ht  (1.575 m)  Wt 444 lb (201.397 kg)  BMI 81.19 kg/m2  SpO2 98%       Assessment & Plan:  1: Chronic heart failure with preserved ejection fraction- She continues to have some fatigue and shortness of breath upon exertion. She did walk into the office from the waiting room door but rode in a wheelchair from the front door to the office because she says that she wouldn't have been able to walk all the way in. She gets weighed weekly when the home health nurse visits and does report a weight gain. By our scale, she's gained 9 pounds since she was last here on 01/11/16. She says that she hasn't taken her diuretic in the last 4 days because her air conditioning is broke and she was very hot and didn't want to overexert herself having to run back and forth to the bathroom. She says that she is planning on taking the diuretic when she gets home. [facility] Pharm[facility] went in and reviewed  medications with the patient. 2: HTN- Blood pressure mildly elevated but most likely this is related to the fact that she hasn't taken her diuretic in the last 4 days.  3: Obstructive sleep apnea- Patient says that she's had trouble sleeping lately but it's because it's hot in her house because her air conditioning is broke and it's supposed to get fixed today. She does wear her oxygen at 2L at bedtime and when she's at home. 4: Lymphedema of lower extremities- This looks better than previously.   Reviewed medication bottles with the patient.  Return here in 3 months or sooner for any questions/problems before then.

## 2016-04-17 NOTE — Telephone Encounter (Signed)
Signed forms faxed to [facility]. 

## 2016-05-01 ENCOUNTER — Ambulatory Visit (INDEPENDENT_AMBULATORY_CARE_PROVIDER_SITE_OTHER): Payer: Medicaid Other | Admitting: Cardiology

## 2016-05-01 ENCOUNTER — Encounter: Payer: Self-pay | Admitting: Cardiology

## 2016-05-01 ENCOUNTER — Other Ambulatory Visit
Admission: RE | Admit: 2016-05-01 | Discharge: 2016-05-01 | Disposition: A | Discharge status: Home / Self Care | Payer: Medicaid Other | Source: Ambulatory Visit | Attending: Cardiology | Admitting: Cardiology

## 2016-05-01 VITALS — BP 185/114 | HR 70 | Ht 62.0 in | Wt >= 6400 oz

## 2016-05-01 DIAGNOSIS — I10 Essential (primary) hypertension: Secondary | ICD-10-CM | POA: Diagnosis not present

## 2016-05-01 DIAGNOSIS — R0600 Dyspnea, unspecified: Secondary | ICD-10-CM | POA: Diagnosis present

## 2016-05-01 LAB — COMPREHENSIVE METABOLIC PANEL
ALT: 16 U/L (ref 14–54)
ANION GAP: 7 (ref 5–15)
AST: 16 U/L (ref 15–41)
Albumin: 3.7 g/dL (ref 3.5–5.0)
Alkaline Phosphatase: 53 U/L (ref 38–126)
BILIRUBIN TOTAL: 0.5 mg/dL (ref 0.3–1.2)
BUN: 15 mg/dL (ref 6–20)
CO2: 31 mmol/L (ref 22–32)
Calcium: 9.3 mg/dL (ref 8.9–10.3)
Chloride: 102 mmol/L (ref 101–111)
Creatinine, Ser: 0.65 mg/dL (ref 0.44–1.00)
Glucose, Bld: 103 mg/dL — ABNORMAL HIGH (ref 65–99)
POTASSIUM: 4 mmol/L (ref 3.5–5.1)
Sodium: 140 mmol/L (ref 135–145)
TOTAL PROTEIN: 7.4 g/dL (ref 6.5–8.1)

## 2016-05-01 NOTE — Progress Notes (Signed)
Cardiology Office Note   Date:  05/01/2016   ID:  [name], [name] 1961/11/04, MRN [id number]  Referring Doctor:  Pcp Not In System   Cardiologist:   Wende Bushy, MD   Reason for consultation:  Chief Complaint  Patient presents with  . New Patient (Initial Visit)    no cp, sob with exertion and no swelling. patient is morbidly obese. no other complaints.      History of Present Illness: [name]. [name] is a 55 y.o. female who presents for Evaluation of possible CHF, diastolic congestive heart failure  Per patient's recollection, she was admitted November 2016 for shortness of breath. She was told that she had CHF at that time. Patient reports chronic shortness of breath. She recalls that one year ago she was so able to wash the dishes and get in and out of the bed. Th that was extent of her physical capacity. Currently, she needs help with transfers and showers. Since being discharged from the hospital in November 2016, she feels that her shortness breath is back to her baseline, which is with minimal exertion.   She denies chest pain. No loss of consciousness. She has chronic edema.  ROS:  Please see the history of present illness. Aside from mentioned under HPI, all other systems are reviewed and negative.     Past Medical History  Diagnosis Date  . Hypertension   . Sleep apnea   . Morbid obesity ([place])   . GERD (gastroesophageal reflux disease)   . Depression   . Lymphedema     History reviewed. No pertinent past surgical history.   reports that she has never smoked. She has never used smokeless tobacco. She reports that she drinks alcohol. She reports that she does not use illicit drugs.   family history includes CVA in her father; Hypertension in her father and mother.   Current Outpatient Prescriptions  Medication Sig Dispense Refill  . atenolol (TENORMIN) 50 MG tablet Take 50 mg by mouth 2 (two) times daily.    . cloNIDine (CATAPRES-TTS-2) 0.2 mg/24hr patch  Place 0.2 mg onto the skin once a week. On Thursday    . furosemide (LASIX) 40 MG tablet Take 80 mg by mouth daily.    Marland Kitchen lisinopril (PRINIVIL,ZESTRIL) 20 MG tablet Take 1 tablet (20 mg total) by mouth daily. 30 tablet 0  . metolazone (ZAROXOLYN) 2.5 MG tablet Take 1 tablet (2.5 mg total) by mouth once a week. 4 tablet 5  . nystatin-triamcinolone ointment (MYCOLOG) Apply topically 2 (two) times daily. 30 g 0  . potassium chloride (K-DUR) 10 MEQ tablet Take 1 tablet (10 mEq total) by mouth 2 (two) times daily. And take an additional tablet weekly when taking the metolazone 64 tablet 3   No current facility-administered medications for this visit.    Allergies: Review of patient's allergies indicates no known allergies.    PHYSICAL EXAM: VS:  BP 185/114 mmHg  Pulse 70  Ht 5' 2"  (1.575 m)  Wt 444 lb 1.9 oz (201.452 kg)  BMI 81.21 kg/m2 , Body mass index is 81.21 kg/(m^2). Wt Readings from Last 3 Encounters:  05/01/16 444 lb 1.9 oz (201.452 kg)  04/10/16 444 lb (201.397 kg)  01/11/16 435 lb (197.315 kg)    GENERAL:  well developed, well nourished,Morbidly obese, not in acute distress HEENT: normocephalic, pink conjunctivae, anicteric sclerae, no xanthelasma, normal dentition, oropharynx clear NECK:  no neck vein engorgement, JVP normal, no hepatojugular reflux, carotid upstroke brisk and  symmetric, no bruit, no thyromegaly, no lymphadenopathy LUNGS:  good respiratory effort, clear to auscultation bilaterally CV:  PMI not displaced, no thrills, no lifts, S1 and S2 within normal limits, no palpable S3 or S4, no murmurs, no rubs, no gallops ABD:  Soft, nontender, , normoactive bowel sounds, could not assess for abdominal aortic bruit/hepatomegaly/splenomegaly due to increased abdominal girth MS: nontender back, no kyphosis, no scoliosis, no joint deformities EXT:  1 + DP/PT pulses, lymphedema noted, no varicosities, no cyanosis, no clubbing SKIN: warm, nondiaphoretic, normal turgor, no  ulcers NEUROPSYCH: alert, oriented to person, place, and time, sensory/motor grossly intact, normal mood, appropriate affect  Recent Labs: 11/02/2015: Hemoglobin 13.9; Platelets 178 11/03/2015: BUN 8; Potassium 4.2; Sodium 143 11/06/2015: Creatinine, Ser 0.55   Lipid Panel No results found for: CHOL, TRIG, HDL, CHOLHDL, VLDL, LDLCALC, LDLDIRECT   Other studies Reviewed:  EKG:  The ekg from 11/07/2015 was personally reviewed by me and it revealed sinus rhythm, 75 BPM, nonspecific changes.  Additional studies/ records that were reviewed personally reviewed by me today include: None available   ASSESSMENT AND PLAN:  Shortness of breath Multifactorial: Most likely mainly due to morbid obesity, possibly diastolic congestive heart failure ? Obesity hypoventilation syndrome No available echocardiogram for review. We'll place order for this to be done at the hospital for contrast use. Check Cmp Continue meds for now  Hypertension BP is well controlled. Continue monitoring BP. Continue current medical therapy and lifestyle changes.  Morbid obesity Body mass index is 81.21 kg/(m^2).Marland Kitchen Recommend aggressive weight loss through diet and increased physical activity  (if possible).    Current medicines are reviewed at length with the patient today.  The patient does not have concerns regarding medicines.  Labs/ tests ordered today include:  Orders Placed This Encounter  Procedures  . Comp Met (CMET)  . Echocardiogram    I had a lengthy and detailed discussion with the patient regarding diagnoses, prognosis, diagnostic options, treatment options .    Disposition:   FU with undersigned In 2 months  I spent at least 45 minutes with the patient today and more than 50% of the time was spent counseling the patient and coordinating care.    Signed, Wende Bushy, MD  05/01/2016 12:55 PM    [place]

## 2016-05-01 NOTE — Patient Instructions (Addendum)
Medication Instructions:  Your physician recommends that you continue on your current medications as directed. Please refer to the Current Medication list given to you today.   Labwork: Labs today CMP go to medical mall entrance of hospital and register at first desk for labs.    Testing/Procedures: Your physician has requested that you have an echocardiogram. Echocardiography is a painless test that uses sound waves to create images of your heart. It provides your doctor with information about the size and shape of your heart and how well your heart's chambers and valves are working. This procedure takes approximately one hour. There are no restrictions for this procedure.  Date & Time: Wednesday May 07, 2016 at 11:00AM Arrive in the medical mall entrance to register at 10:45AM  Please call if you need to reschedule this appointment [phone]  Follow-Up: Your physician recommends that you schedule a follow-up appointment in: 2 months with Dr. Alvino ChapelIngal.  Date & Time: _______________________________________________________________________   Any Other Special Instructions Will Be Listed Below (If Applicable).     If you need a refill on your cardiac medications before your next appointment, please call your pharmacy.  Echocardiogram An echocardiogram, or echocardiography, uses sound waves (ultrasound) to produce an image of your heart. The echocardiogram is simple, painless, obtained within a short period of time, and offers valuable information to your health care provider. The images from an echocardiogram can provide information such as:  Evidence of coronary artery disease (CAD).  Heart size.  Heart muscle function.  Heart valve function.  Aneurysm detection.  Evidence of a past heart attack.  Fluid buildup around the heart.  Heart muscle thickening.  Assess heart valve function. LET [facility] PROVID[facility] KNOW ABOUT:  Any allergies you have.  All medicines  you are taking, including vitamins, herbs, eye drops, creams, and over-the-counter medicines.  Previous problems you or members of your family have had with the use of anesthetics.  Any blood disorders you have.  Previous surgeries you have had.  Medical conditions you have.  Possibility of pregnancy, if this applies. BEFORE THE PROCEDURE  No special preparation is needed. Eat and drink normally.  PROCEDURE   In order to produce an image of your heart, gel will be applied to your chest and a wand-like tool (transducer) will be moved over your chest. The gel will help transmit the sound waves from the transducer. The sound waves will harmlessly bounce off your heart to allow the heart images to be captured in real-time motion. These images will then be recorded.  You may need an IV to receive a medicine that improves the quality of the pictures. AFTER THE PROCEDURE You may return to your normal schedule including diet, activities, and medicines, unless your health care provider tells you otherwise.   This information is not intended to replace advice given to you by your health care provider. Make sure you discuss any questions you have with your health care provider.   Document Released: 12/05/2000 Document Revised: 12/29/2014 Document Reviewed: 08/15/2013 Elsevier Interactive Patient Education [employer]2016 Elsevier Inc.

## 2016-05-07 ENCOUNTER — Ambulatory Visit
Admission: RE | Admit: 2016-05-07 | Discharge: 2016-05-07 | Disposition: A | Discharge status: Home / Self Care | Payer: Medicaid Other | Source: Ambulatory Visit | Attending: Cardiology | Admitting: Cardiology

## 2016-05-07 DIAGNOSIS — G4730 Sleep apnea, unspecified: Secondary | ICD-10-CM | POA: Insufficient documentation

## 2016-05-07 DIAGNOSIS — K219 Gastro-esophageal reflux disease without esophagitis: Secondary | ICD-10-CM | POA: Diagnosis not present

## 2016-05-07 DIAGNOSIS — I10 Essential (primary) hypertension: Secondary | ICD-10-CM | POA: Diagnosis not present

## 2016-05-07 DIAGNOSIS — R0600 Dyspnea, unspecified: Secondary | ICD-10-CM | POA: Diagnosis not present

## 2016-05-07 DIAGNOSIS — I890 Lymphedema, not elsewhere classified: Secondary | ICD-10-CM | POA: Diagnosis not present

## 2016-05-07 DIAGNOSIS — F329 Major depressive disorder, single episode, unspecified: Secondary | ICD-10-CM | POA: Diagnosis not present

## 2016-05-07 NOTE — Progress Notes (Signed)
*  PRELIMINARY RESULTS* Echocardiogram 2D Echocardiogram has been performed.  [name] 05/07/2016, 11:18 AM

## 2016-05-08 ENCOUNTER — Ambulatory Visit: Payer: Medicaid Other

## 2016-06-11 NOTE — Progress Notes (Signed)
This encounter was created in error - please disregard.

## 2016-07-03 ENCOUNTER — Ambulatory Visit: Payer: Medicaid Other | Admitting: Cardiology

## 2016-07-10 ENCOUNTER — Ambulatory Visit: Payer: Medicaid Other | Attending: Family | Admitting: Family

## 2016-07-10 ENCOUNTER — Encounter: Payer: Self-pay | Admitting: Family

## 2016-07-10 VITALS — BP 157/82 | HR 66 | Resp 20 | Ht 62.0 in | Wt >= 6400 oz

## 2016-07-10 DIAGNOSIS — Z8249 Family history of ischemic heart disease and other diseases of the circulatory system: Secondary | ICD-10-CM | POA: Insufficient documentation

## 2016-07-10 DIAGNOSIS — R42 Dizziness and giddiness: Secondary | ICD-10-CM | POA: Insufficient documentation

## 2016-07-10 DIAGNOSIS — M25561 Pain in right knee: Secondary | ICD-10-CM | POA: Diagnosis not present

## 2016-07-10 DIAGNOSIS — R5383 Other fatigue: Secondary | ICD-10-CM | POA: Diagnosis not present

## 2016-07-10 DIAGNOSIS — R600 Localized edema: Secondary | ICD-10-CM | POA: Insufficient documentation

## 2016-07-10 DIAGNOSIS — Z823 Family history of stroke: Secondary | ICD-10-CM | POA: Diagnosis not present

## 2016-07-10 DIAGNOSIS — R05 Cough: Secondary | ICD-10-CM | POA: Diagnosis not present

## 2016-07-10 DIAGNOSIS — R0601 Orthopnea: Secondary | ICD-10-CM | POA: Insufficient documentation

## 2016-07-10 DIAGNOSIS — G4733 Obstructive sleep apnea (adult) (pediatric): Secondary | ICD-10-CM | POA: Insufficient documentation

## 2016-07-10 DIAGNOSIS — M25562 Pain in left knee: Secondary | ICD-10-CM | POA: Diagnosis not present

## 2016-07-10 DIAGNOSIS — K219 Gastro-esophageal reflux disease without esophagitis: Secondary | ICD-10-CM | POA: Diagnosis not present

## 2016-07-10 DIAGNOSIS — R0602 Shortness of breath: Secondary | ICD-10-CM | POA: Diagnosis not present

## 2016-07-10 DIAGNOSIS — I110 Hypertensive heart disease with heart failure: Secondary | ICD-10-CM | POA: Diagnosis not present

## 2016-07-10 DIAGNOSIS — I10 Essential (primary) hypertension: Secondary | ICD-10-CM

## 2016-07-10 DIAGNOSIS — Z6841 Body Mass Index (BMI) 40.0 and over, adult: Secondary | ICD-10-CM | POA: Insufficient documentation

## 2016-07-10 DIAGNOSIS — I890 Lymphedema, not elsewhere classified: Secondary | ICD-10-CM | POA: Diagnosis not present

## 2016-07-10 DIAGNOSIS — F329 Major depressive disorder, single episode, unspecified: Secondary | ICD-10-CM | POA: Diagnosis not present

## 2016-07-10 DIAGNOSIS — R002 Palpitations: Secondary | ICD-10-CM | POA: Diagnosis not present

## 2016-07-10 DIAGNOSIS — Z9981 Dependence on supplemental oxygen: Secondary | ICD-10-CM | POA: Insufficient documentation

## 2016-07-10 DIAGNOSIS — I5032 Chronic diastolic (congestive) heart failure: Secondary | ICD-10-CM | POA: Diagnosis present

## 2016-07-10 NOTE — Progress Notes (Signed)
Subjective:    Patient ID: [name], female    DOB: January 13, 1961, 55 y.o.   MRN: [id number]  Congestive Heart Failure Presents for follow-up visit. The disease course has been stable. Associated symptoms include edema, fatigue, orthopnea, palpitations ("at times") and shortness of breath. Pertinent negatives include no abdominal pain, chest pain or chest pressure. The symptoms have been stable. Past treatments include beta blockers, salt and fluid restriction, oxygen and ACE inhibitors. The treatment provided moderate relief. Compliance with prior treatments has been good. Her past medical history is significant for HTN. There is no history of CVA or DM.  Hypertension This is a chronic problem. The current episode started more than 1 year ago. The problem has been waxing and waning since onset. Associated symptoms include palpitations ("at times"), peripheral edema and shortness of breath. Pertinent negatives include no anxiety, chest pain or headaches. There are no associated agents to hypertension. Risk factors for coronary artery disease include obesity, post-menopausal state and sedentary lifestyle. Past treatments include ACE inhibitors, beta blockers, alpha 1 blockers, diuretics and lifestyle changes. The current treatment provides moderate improvement. Compliance problems include exercise.  Hypertensive end-organ damage includes heart failure.    Past Medical History  Diagnosis Date  . Hypertension   . Sleep apnea   . Morbid obesity (HCC)   . GERD (gastroesophageal reflux disease)   . Depression   . Lymphedema     No past surgical history on file.  Family History  Problem Relation Age of Onset  . CVA Father   . Hypertension Father   . Hypertension Mother     Social History  Substance Use Topics  . Smoking status: Never Smoker   . Smokeless tobacco: Never Used  . Alcohol Use: 0.0 oz/week    0 Standard drinks or equivalent per week     Comment: occasional alcohol use     No Known Allergies  Prior to Admission medications   Medication Sig Start Date End Date Taking? Authorizing Provider  diclofenac sodium (VOLTAREN) 1 % GEL Apply 2 g topically 2 (two) times daily as needed.   Yes Historical Provider, MD  Zinc Oxide (DR SMITHS DIAPER RASH EX) Apply topically.   Yes Historical Provider, MD  atenolol (TENORMIN) 50 MG tablet Take 50 mg by mouth 2 (two) times daily.    Historical Provider, MD  cloNIDine (CATAPRES-TTS-2) 0.2 mg/24hr patch Place 0.2 mg onto the skin once a week. On Thursday    Historical Provider, MD  furosemide (LASIX) 40 MG tablet Take 80 mg by mouth daily.    Historical Provider, MD  lisinopril (PRINIVIL,ZESTRIL) 20 MG tablet Take 1 tablet (20 mg total) by mouth daily. 11/06/15   Alford Highland, MD  metolazone (ZAROXOLYN) 2.5 MG tablet Take 1 tablet (2.5 mg total) by mouth once a week. 04/10/16   Delma Freeze, FNP  potassium chloride (K-DUR) 10 MEQ tablet Take 1 tablet (10 mEq total) by mouth 2 (two) times daily. And take an additional tablet weekly when taking the metolazone 02/14/16   Delma Freeze, FNP     Review of Systems  Constitutional: Positive for fatigue. Negative for appetite change.  HENT: Negative for congestion, postnasal drip and sore throat.   Eyes: Negative.   Respiratory: Positive for cough and shortness of breath. Negative for chest tightness and wheezing.   Cardiovascular: Positive for palpitations ("at times") and leg swelling. Negative for chest pain.  Gastrointestinal: Negative for abdominal pain and abdominal distention.  Endocrine: Negative.  Genitourinary: Negative.   Musculoskeletal: Positive for arthralgias (bilateral knee pain). Negative for back pain.  Skin: Negative.   Allergic/Immunologic: Negative.   Neurological: Positive for light-headedness. Negative for dizziness and headaches.  Hematological: Negative for adenopathy. Does not bruise/bleed easily.  Psychiatric/Behavioral: Negative for sleep  disturbance (sleeping on 4 pillows; oxygen at 2L) and dysphoric mood. The patient is not nervous/anxious.        Objective:   Physical Exam  Constitutional: She is oriented to person, place, and time. She appears well-developed and well-nourished.  HENT:  Head: Normocephalic and atraumatic.  Eyes: Conjunctivae are normal. Pupils are equal, round, and reactive to light.  Neck: Normal range of motion. Neck supple.  Cardiovascular: Normal rate and regular rhythm.   Pulmonary/Chest: Effort normal. She has no wheezes. She has no rales.  Abdominal: Soft. She exhibits no distension. There is no tenderness.  Musculoskeletal: She exhibits edema. She exhibits no tenderness.  Neurological: She is alert and oriented to person, place, and time.  Skin: Skin is warm and dry.  Psychiatric: She has a normal mood and affect. Her behavior is normal. Thought content normal.  Nursing note and vitals reviewed.   BP 157/82 mmHg  Pulse 66  Resp 20  Ht 5\' 2"  (1.575 m)  Wt 442 lb (200.49 kg)  BMI 80.82 kg/m2  SpO2 95%       Assessment & Plan:  1: Chronic heart failure with preserved ejection fraction- Patient presents with fatigue and shortness of breath with minimal exertion (Class III). She was short of breath upon walking into the office but she was able to walk all the way to the office instead of normally riding in a wheelchair. After she sat down for a few minutes, her symptoms improved. She continues to get weighed when the home health nurse comes. By our scale, she's lost 1.4 pounds since she was last here on 04/10/16. Reminded to call for a weight gain. She already takes metolazone on a weekly basis. She is not adding any salt to her food and tries to eat low sodium foods. [facility] and reviewed medications with the patient. Goes to her cardiologist on 07/23/16. 2: HTN- Blood pressure mildly elevated but she says that she hadn't taken her medications yet because she was running late. She says  that she will take them once she returns home. 3: Obstructive sleep apnea- She does wear oxygen at 2L at bedtime and when she's at home.  4: Lymphedema- Patient has pronounced lymphedema in both lower extremities. She sees her PCP on 08/18/16 to follow-up.   Patient did not bring her medications nor a list. Each medication was verbally reviewed with the patient and she was encouraged to bring the bottles to every visit to confirm accuracy of list.  Return in 3 months or sooner for any questions/problems before then.

## 2016-07-10 NOTE — Patient Instructions (Signed)
Continue weighing when home health nurse comes and call for weight gain.

## 2016-07-22 ENCOUNTER — Ambulatory Visit: Payer: Medicaid Other | Admitting: Cardiology

## 2016-07-30 ENCOUNTER — Ambulatory Visit: Payer: Medicaid Other | Admitting: Cardiology

## 2016-08-07 ENCOUNTER — Telehealth: Payer: Self-pay | Admitting: Family

## 2016-08-07 MED ORDER — METOLAZONE 2.5 MG PO TABS: 2.5000 mg | ORAL_TABLET | Freq: Every day | ORAL | 0 refills | Status: DC

## 2016-08-07 NOTE — Telephone Encounter (Signed)
Melinda from [facility] called to say that patient's weight is up 5 pounds this week and 5 pounds the week before for a total of 10 pound weight gain in the last 2 weeks. Juliette AlcideMelinda says that patient had been skipping furosemide doses because she had some back pain and had difficulty getting up and down to the bathroom but says that she's only missed one dose this week. She also takes metolazone 2.5mg  on a weekly basis and the nurse was wondering if she could give an additional metolazone today. Advised nurse to go ahead and do that and continue monitoring her weight closely. Will send 1 extra dosage to her pharmacy.

## 2016-09-02 ENCOUNTER — Telehealth: Payer: Self-pay | Admitting: Family

## 2016-09-02 MED ORDER — TORSEMIDE 20 MG PO TABS: 80.0000 mg | ORAL_TABLET | Freq: Every day | ORAL | 3 refills | Status: DC

## 2016-09-02 NOTE — Telephone Encounter (Signed)
Returned phone call from [place] from [facility] regarding patient's weight. She says that the patient has gained 10 pounds over the last week and 20 pounds since 07/23/16 and patient doesn't feel like the furosemide is working like it used to. No change in her breathing status and she has chronic lymphedema. Patient did miss 2 days of her medications because she was out of town. She continues to take 2.5mg  metolazone on a weekly basis along with potassium 10meq BID. Will change her diuretic to torsemide 80mg  once daily and a new prescription was sent to patient's pharmacy. Depending on how she responds to it, we may need to back off on the dosage. Juliette AlcideMelinda verbalized understanding and will have patient stop her furosemide when she gets the torsemide. She reports a recent (08/18/16) creatinine of 0.69 and potassium of 3.8. Patient has an appointment scheduled here in October 2017 but can be seen sooner if needed.

## 2016-09-11 ENCOUNTER — Telehealth: Payer: Self-pay | Admitting: Family

## 2016-09-11 NOTE — Telephone Encounter (Signed)
Patient called to say that she's having a problem taking the torsemide. She says that she's been taking it for the last 3 days and ever since she started taking it, her stomach has been sore and it doesn't work any different than the furosemide. She is asking to go back on the furosemide. Advised her that she could resume her furosemide and have Juliette AlcideMelinda call me if her weight goes up >2 pounds overnight or >5 pounds in a week as we may need to adjust her metolazone. Patient was in agreement with this plan.

## 2016-09-12 ENCOUNTER — Telehealth: Payer: Self-pay | Admitting: Family

## 2016-09-12 MED ORDER — METOLAZONE 2.5 MG PO TABS: 2.5000 mg | ORAL_TABLET | ORAL | 3 refills | Status: DC

## 2016-09-12 MED ORDER — FUROSEMIDE 40 MG PO TABS: 40.0000 mg | ORAL_TABLET | Freq: Two times a day (BID) | ORAL | 3 refills | Status: DC

## 2016-09-12 NOTE — Telephone Encounter (Signed)
Melinda from [facility] called regarding patient's conversation with me yesterday regarding her torsemide making her stomach hurt. Juliette AlcideMelinda said that patient's weight has come down nicely since being on the torsemide and she has come down from 449 pounds to 433 pounds today. Patient really wants to resume her furosemide so will try her on metolazone twice weekly to see if that helps. Will check lab work at her next visit on 10/13/16 if not done before that.

## 2016-10-07 ENCOUNTER — Telehealth: Payer: Self-pay | Admitting: Cardiovascular Disease

## 2016-10-07 NOTE — Telephone Encounter (Signed)
Juliette AlcideMelinda with [facility] is looking for faxed orders on [facility] on 09/12/16. Orders have been sent a couple of times. Please fax [phone]

## 2016-10-07 NOTE — Telephone Encounter (Signed)
Orders are on Dr. Windell HummingbirdGollan's desk awaiting review.

## 2016-10-09 NOTE — Telephone Encounter (Signed)
Everything signed

## 2016-10-13 ENCOUNTER — Ambulatory Visit: Payer: Medicaid Other | Admitting: Family

## 2016-10-24 ENCOUNTER — Encounter: Payer: Self-pay | Admitting: Family

## 2016-10-24 ENCOUNTER — Ambulatory Visit: Payer: Medicaid Other | Attending: Family | Admitting: Family

## 2016-10-24 VITALS — BP 102/70 | HR 81 | Resp 18 | Ht 62.0 in | Wt >= 6400 oz

## 2016-10-24 DIAGNOSIS — G4733 Obstructive sleep apnea (adult) (pediatric): Secondary | ICD-10-CM | POA: Diagnosis not present

## 2016-10-24 DIAGNOSIS — Z79899 Other long term (current) drug therapy: Secondary | ICD-10-CM | POA: Insufficient documentation

## 2016-10-24 DIAGNOSIS — I5032 Chronic diastolic (congestive) heart failure: Secondary | ICD-10-CM | POA: Diagnosis present

## 2016-10-24 DIAGNOSIS — I890 Lymphedema, not elsewhere classified: Secondary | ICD-10-CM | POA: Diagnosis not present

## 2016-10-24 DIAGNOSIS — R5383 Other fatigue: Secondary | ICD-10-CM | POA: Diagnosis not present

## 2016-10-24 DIAGNOSIS — I110 Hypertensive heart disease with heart failure: Secondary | ICD-10-CM | POA: Insufficient documentation

## 2016-10-24 DIAGNOSIS — I10 Essential (primary) hypertension: Secondary | ICD-10-CM

## 2016-10-24 NOTE — Patient Instructions (Signed)
Continue weighing daily and call for an overnight weight gain of > 2 pounds or a weekly weight gain of >5 pounds. 

## 2016-10-24 NOTE — Progress Notes (Signed)
Patient ID: [name], female    DOB: [birth year], 55 y.o.   MRN: [id number]  HPI  Ms [name] is a 55 y/o female with a history of obstructive sleep apnea, morbid obesity, lymphedema, HTN, GERD, depression and chronic heart failure.  Last echo was done 05/07/16 with an EF of 60-65%. No valvular regurgitation. Technically difficult study however due to body habitus and patient was in a wheelchair.   Last admission was 11/01/15 for left arm & left breast cellulitis. Was treated with IV antibiotics and discharged on oral antibiotics. Was discharged with oxygen at bedtime as well. Was discharged on day 5 with home health set up.  She presents today for a follow-up visit with fatigue and shortness of breath with mild exertion. Did walk into the office using her walker. Chronic lower extremity swelling due to lymphedema. Trying to weigh daily but admits to missing some days. Home health nurse comes out weekly. Stopped her potassium because she said that her it made her stomach hurt. Per patient, clonidine has been stopped due to her blood pressure getting too low.   Past Medical History:  Diagnosis Date  . Depression   . GERD (gastroesophageal reflux disease)   . Hypertension   . Lymphedema   . Morbid obesity (HCC)   . Sleep apnea     No past surgical history on file.  Family History  Problem Relation Age of Onset  . CVA Father   . Hypertension Father   . Hypertension Mother     Social History  Substance Use Topics  . Smoking status: Never Smoker  . Smokeless tobacco: Never Used  . Alcohol use 0.0 oz/week     Comment: occasional alcohol use    No Known Allergies  Prior to Admission medications   Medication Sig Start Date End Date Taking? Authorizing Provider  atenolol (TENORMIN) 50 MG tablet Take 50 mg by mouth 2 (two) times daily.   Yes Historical Provider, MD  cyclobenzaprine (FLEXERIL) 5 MG tablet Take 5 mg by mouth 3 (three) times daily as needed for muscle spasms.   Yes  Historical Provider, MD  diclofenac sodium (VOLTAREN) 1 % GEL Apply 2 g topically 2 (two) times daily as needed.   Yes Historical Provider, MD  furosemide (LASIX) 40 MG tablet Take 1 tablet (40 mg total) by mouth 2 (two) times daily. Patient taking differently: Take 80 mg by mouth daily.  09/12/16 12/11/16 Yes Delma Freezeina A Hackney, FNP  lisinopril (PRINIVIL,ZESTRIL) 20 MG tablet Take 1 tablet (20 mg total) by mouth daily. Patient taking differently: Take 40 mg by mouth daily.  11/06/15  Yes Alford Highlandichard Wieting, MD  metolazone (ZAROXOLYN) 2.5 MG tablet Take 1 tablet (2.5 mg total) by mouth 2 (two) times a week. 09/15/16  Yes Delma Freezeina A Hackney, FNP  ranitidine (ZANTAC) 150 MG tablet Take 150 mg by mouth 2 (two) times daily.   Yes Historical Provider, MD  Zinc Oxide (DR SMITHS DIAPER RASH EX) Apply topically.   Yes Historical Provider, MD     Review of Systems  Constitutional: Positive for fatigue. Negative for appetite change.  HENT: Positive for rhinorrhea. Negative for congestion and sore throat.   Eyes: Negative.   Respiratory: Positive for shortness of breath. Negative for cough and chest tightness.   Cardiovascular: Positive for chest pain (rarely) and leg swelling. Negative for palpitations.  Gastrointestinal: Negative for abdominal distention and abdominal pain.  Endocrine: Negative.   Genitourinary: Negative.   Musculoskeletal: Positive for back pain (minimal).  Negative for neck pain.  Skin: Negative.   Allergic/Immunologic: Negative.   Neurological: Positive for light-headedness. Negative for dizziness.  Hematological: Negative for adenopathy. Does not bruise/bleed easily.  Psychiatric/Behavioral: Positive for sleep disturbance (wear oxygen @ 2L. sometimes difficulty sleeping). Negative for dysphoric mood. The patient is not nervous/anxious.    Vitals:   10/24/16 1054  BP: 102/70  Pulse: 81  Resp: 18  SpO2: 99%  Weight: (!) 428 lb (194.1 kg)  Height: 5\' 2"  (1.575 m)    Wt Readings from  Last 3 Encounters:  10/24/16 (!) 428 lb (194.1 kg)  07/10/16 (!) 442 lb (200.5 kg)  05/01/16 (!) 444 lb 1.9 oz (201.5 kg)      Physical Exam  Constitutional: She is oriented to person, place, and time. She appears well-developed and well-nourished.  HENT:  Head: Normocephalic and atraumatic.  Eyes: Conjunctivae are normal. Pupils are equal, round, and reactive to light.  Neck: Normal range of motion. Neck supple.  Cardiovascular: Normal rate and regular rhythm.   Pulmonary/Chest: Effort normal. She has no wheezes. She has no rales.  Abdominal: Soft. She exhibits no distension. There is no tenderness.  Musculoskeletal: She exhibits edema (chronic lymphedema). She exhibits no tenderness.  Neurological: She is alert and oriented to person, place, and time.  Skin: Skin is warm and dry.  Psychiatric: She has a normal mood and affect. Her behavior is normal. Thought content normal.  Nursing note and vitals reviewed.  Assessment & Plan:  1: Chronic heart failure with preserved ejection fraction- - NYHA class III - euvolemic today - weigh daily consistently. Has lost 14 pounds since she was last here July 2017. Home health nurse to monitor on a weekly basis - home health to draw a BMP on 10/29/16 and fax to us since she's been off her potassium - needs to see cardiology Alvino Chapel([place]) May 2018 - continue to increase activity using her walker for stability  2: HTN- - BP looks good today - sees PCP 11/18/16  3: Obstructive sleep apnea- - wears oxygen at 2L at bedtime  4: Lymphedema- - appears less swollen than previously  Return here in 3 months or sooner for any questions/problems before then.

## 2016-10-30 ENCOUNTER — Telehealth: Payer: Self-pay | Admitting: Family

## 2016-10-30 MED ORDER — POTASSIUM CHLORIDE 20 MEQ PO PACK: 20.0000 meq | PACK | Freq: Every day | ORAL | 5 refills | Status: DC

## 2016-10-30 NOTE — Telephone Encounter (Signed)
Spoke with patient regarding lab work that was drawn on 10/29/16 and advised patient that her potassium was low at 3.3. She has said that the potassium tablets made her stomach hurt and that's why she stopped taking them. She is willing to try the powder version to see if she can tolerate that better.   Prescription sent for 20meq packet of Klor-con daily. Nelly Routdvised Melinda of results and new order for potassium. She will also check lab work in one week and fax results to us.

## 2016-11-12 ENCOUNTER — Telehealth: Payer: Self-pay | Admitting: Family

## 2016-11-12 NOTE — Telephone Encounter (Signed)
Reviewed lab results with [facility] nurse, Juliette AlcideMelinda. that were drawn on 11/11/16. Hemolysis was present which has probably skewed the potassium level and has it falsely elevated. Potassium was 3.3 on 10/29/16 and 20meq potassium was added daily so doubtful that it would elevate to 5.6 in 2 weeks. Juliette AlcideMelinda will recheck it on 11/17/16 & fax results to us.

## 2016-11-19 ENCOUNTER — Telehealth: Payer: Self-pay | Admitting: Family

## 2016-11-19 NOTE — Telephone Encounter (Signed)
Reviewed lab results that were drawn on 11/17/16. Potassium level is normal at 3.9 so she is to continue taking 20meq KCl daily. Renal function is normal as well. [facility]) about results and to continue the potassium.

## 2017-01-01 IMAGING — CR DG CHEST 2V
2 series · 2 of 2 positions shown · non-contrast
Comparison: February 15, 2013

CLINICAL DATA: Intermittent cough and congestion. Left breast
swelling and redness.

EXAM:
CHEST  2 VIEW

[chest lat]
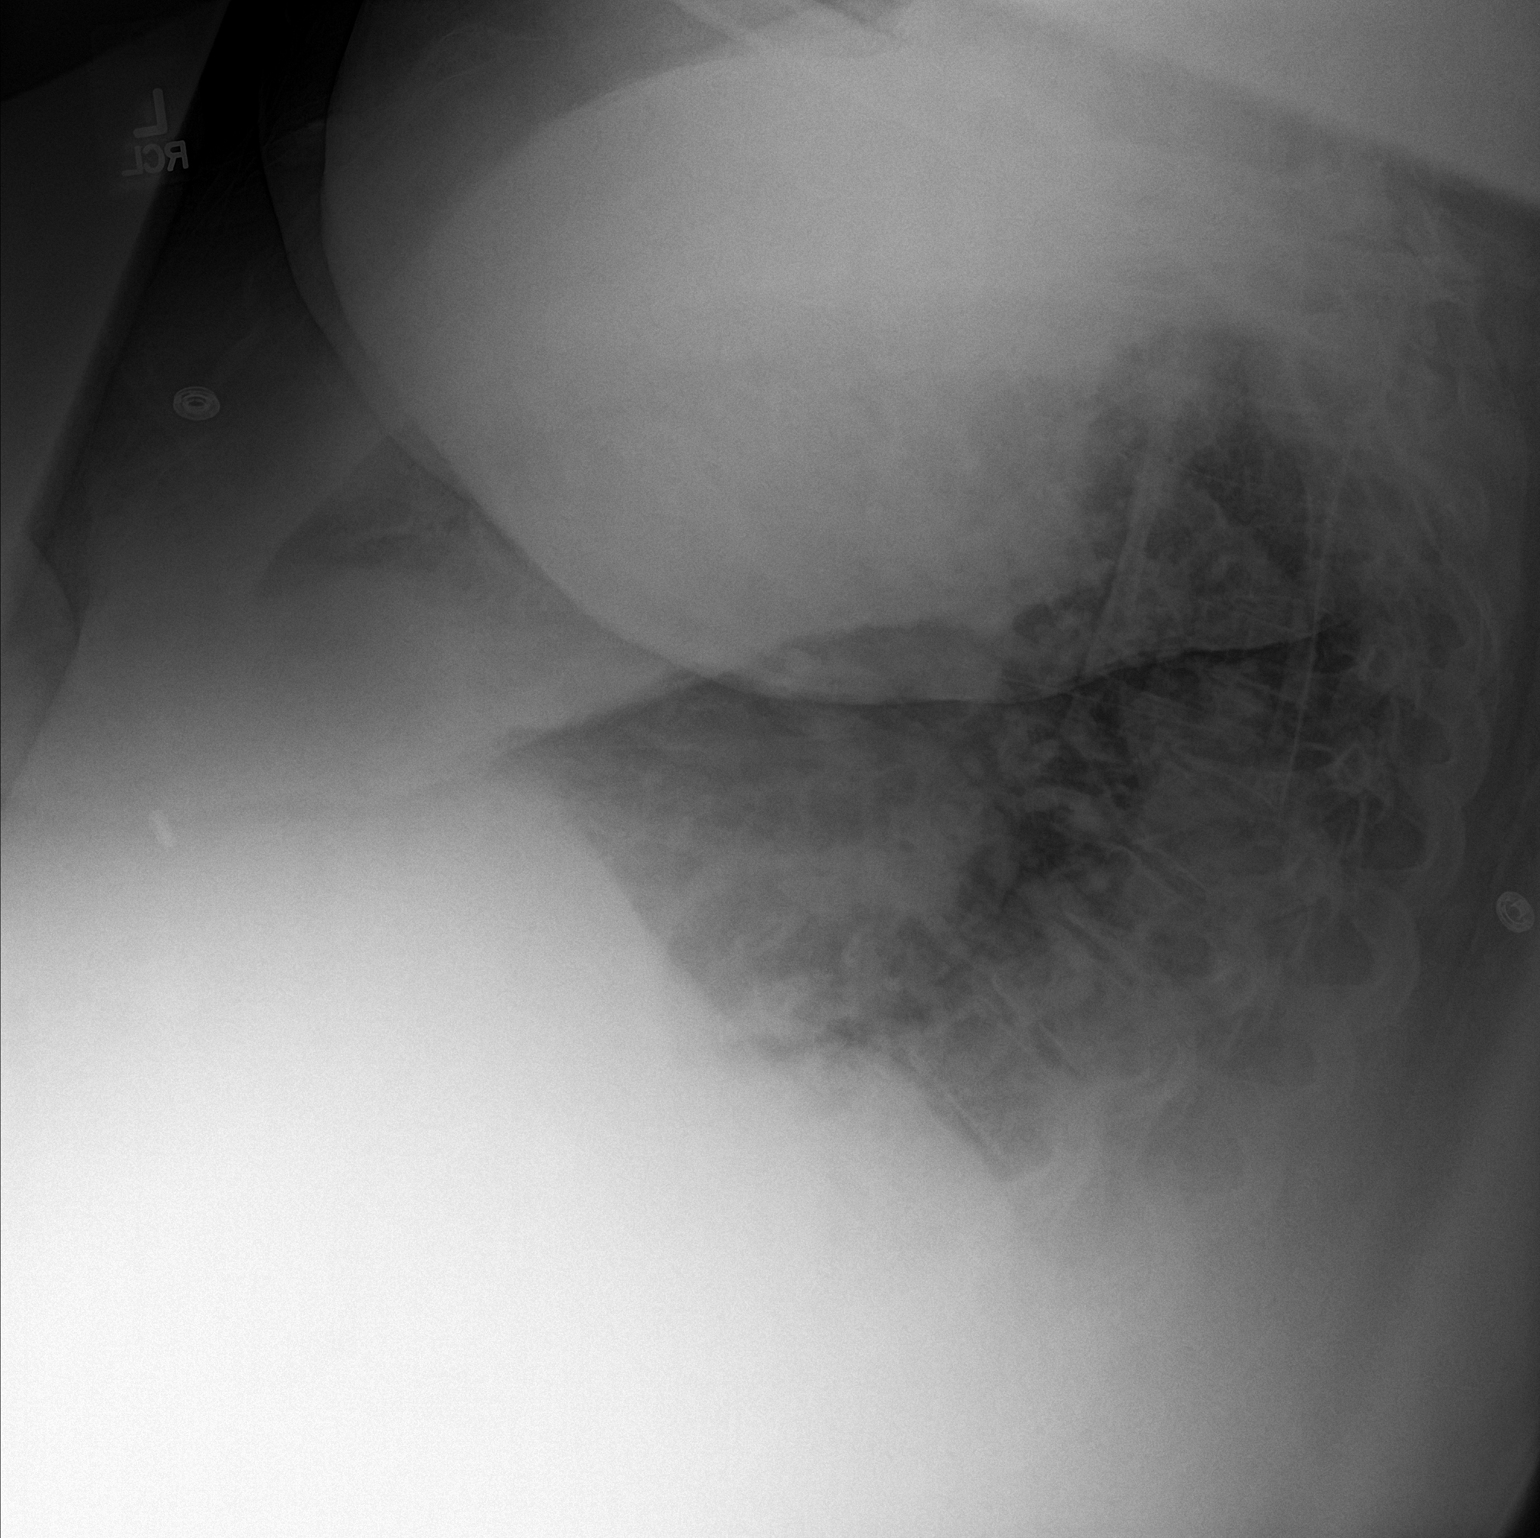

[chest ap]
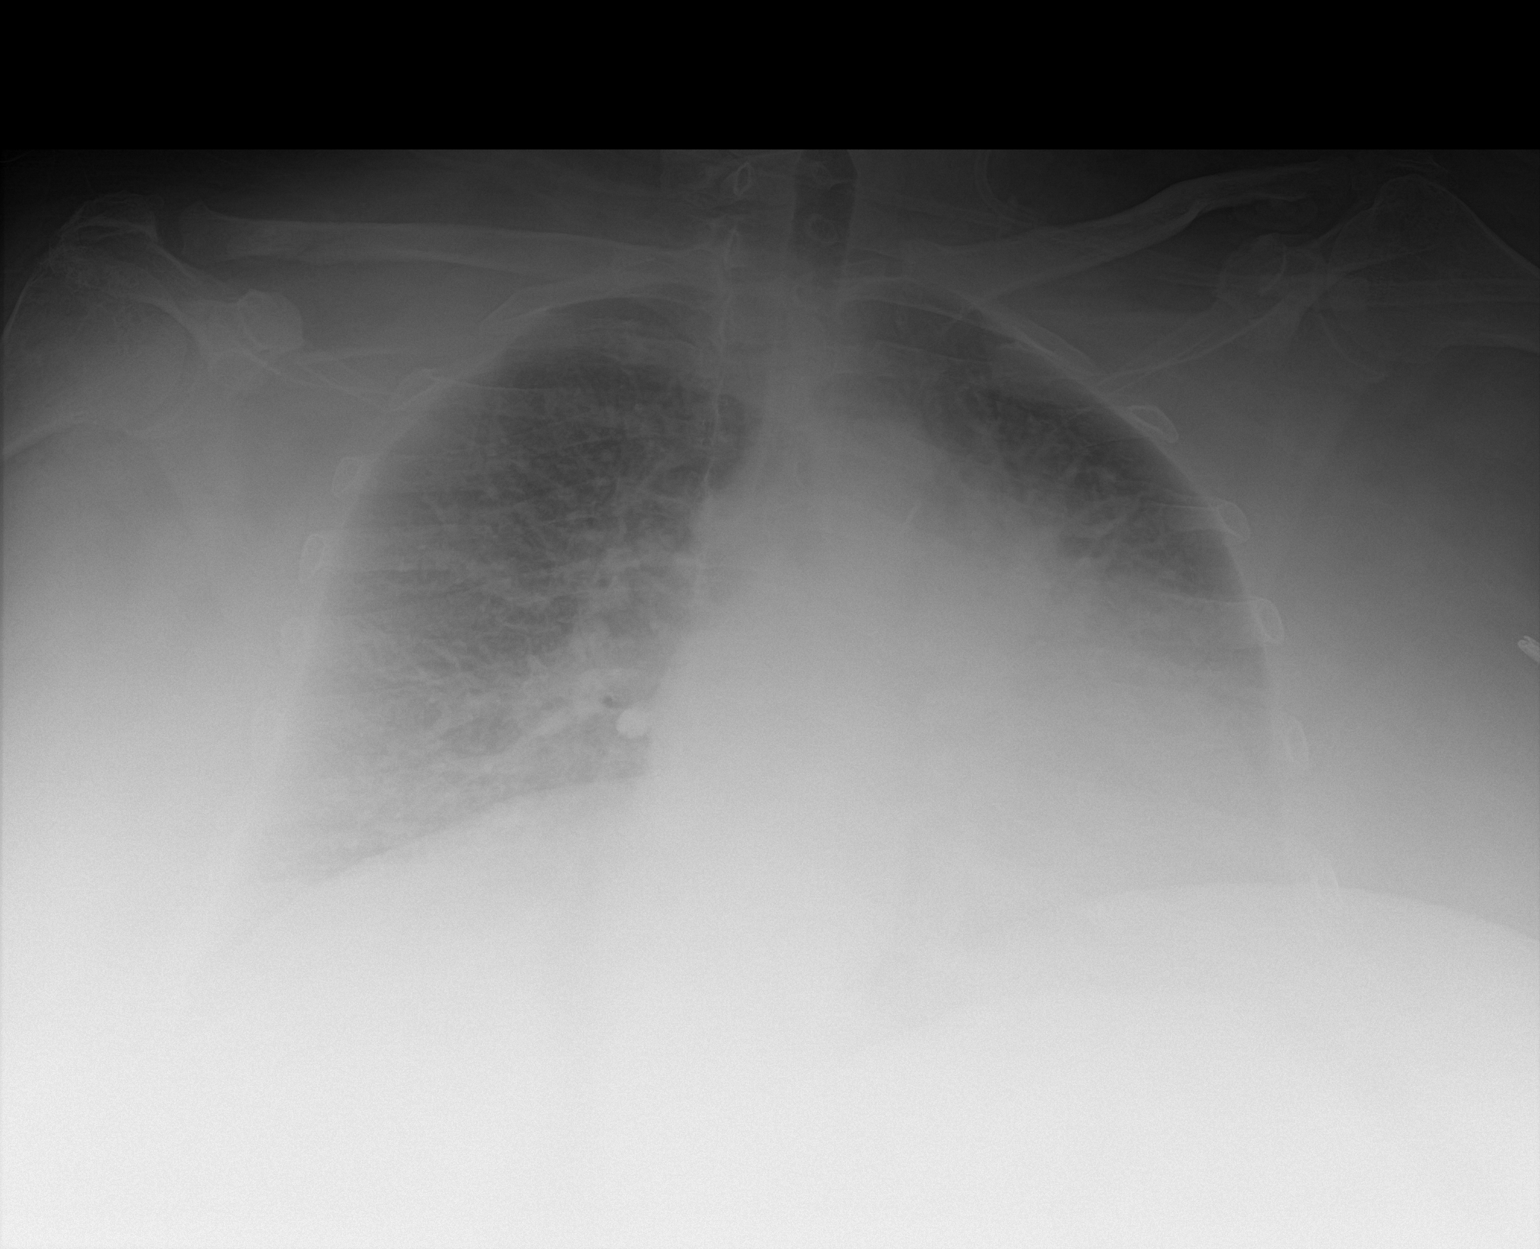

[2 of 2 positions shown; findings below may reference images not displayed]

FINDINGS: There is interstitial edema with cardiomegaly and pulmonary venous
hypertension. No airspace consolidation. No adenopathy appreciable.
No bone lesions. No visualized soft tissue lesions.
IMPRESSION: Evidence of a degree of congestive heart failure. No airspace
consolidation.

## 2017-01-27 ENCOUNTER — Ambulatory Visit: Payer: Medicaid Other | Admitting: Family

## 2017-02-13 ENCOUNTER — Ambulatory Visit: Payer: Medicaid Other | Attending: Family | Admitting: Family

## 2017-02-13 ENCOUNTER — Encounter: Payer: Self-pay | Admitting: Family

## 2017-02-13 VITALS — BP 128/70 | HR 76 | Resp 18 | Ht 62.0 in | Wt >= 6400 oz

## 2017-02-13 DIAGNOSIS — Z79899 Other long term (current) drug therapy: Secondary | ICD-10-CM | POA: Insufficient documentation

## 2017-02-13 DIAGNOSIS — F329 Major depressive disorder, single episode, unspecified: Secondary | ICD-10-CM | POA: Insufficient documentation

## 2017-02-13 DIAGNOSIS — I110 Hypertensive heart disease with heart failure: Secondary | ICD-10-CM | POA: Insufficient documentation

## 2017-02-13 DIAGNOSIS — I5032 Chronic diastolic (congestive) heart failure: Secondary | ICD-10-CM

## 2017-02-13 DIAGNOSIS — G4733 Obstructive sleep apnea (adult) (pediatric): Secondary | ICD-10-CM

## 2017-02-13 DIAGNOSIS — I10 Essential (primary) hypertension: Secondary | ICD-10-CM

## 2017-02-13 DIAGNOSIS — I890 Lymphedema, not elsewhere classified: Secondary | ICD-10-CM | POA: Diagnosis not present

## 2017-02-13 DIAGNOSIS — K219 Gastro-esophageal reflux disease without esophagitis: Secondary | ICD-10-CM | POA: Insufficient documentation

## 2017-02-13 MED ORDER — METOLAZONE 2.5 MG PO TABS: 2.5000 mg | ORAL_TABLET | ORAL | 5 refills | Status: DC

## 2017-02-13 MED ORDER — FUROSEMIDE 80 MG PO TABS: 80.0000 mg | ORAL_TABLET | Freq: Every day | ORAL | 5 refills | Status: DC

## 2017-02-13 MED ORDER — ATENOLOL 50 MG PO TABS: 50.0000 mg | ORAL_TABLET | Freq: Two times a day (BID) | ORAL | 5 refills | Status: DC

## 2017-02-13 MED ORDER — LISINOPRIL 40 MG PO TABS: 40.0000 mg | ORAL_TABLET | Freq: Every day | ORAL | 5 refills | Status: DC

## 2017-02-13 NOTE — Progress Notes (Signed)
Patient ID: Bailey HurlLinda J. [name], female    DOB: [birth year], 56 y.o.   MRN: [id number]  HPI  [name] is a 56 y/o female with a history of obstructive sleep apnea, morbid obesity, lymphedema, HTN, GERD, depression and chronic heart failure.  Last echo was done 05/07/16 with an EF of 60-65%. No valvular regurgitation. Technically difficult study however due to body habitus and patient was in a wheelchair.   Last admission was 11/01/15 for left arm & left breast cellulitis. Was treated with IV antibiotics and discharged on oral antibiotics. Was discharged with oxygen at bedtime as well. Was discharged on day 5 with home health set up.  She presents today for a follow-up visit with fatigue and shortness of breath with mild exertion. Did walk into the office using her walker. Chronic lower extremity swelling due to lymphedema. Not weighing daily. Home health nurse comes out every 2 weeks. Does have an aide that comes out on a daily basis to assist with ADL's.  Past Medical History:  Diagnosis Date  . Depression   . GERD (gastroesophageal reflux disease)   . Hypertension   . Lymphedema   . Morbid obesity (HCC)   . Sleep apnea    No past surgical history on file.  Family History  Problem Relation Age of Onset  . CVA Father   . Hypertension Father   . Hypertension Mother    Social History  Substance Use Topics  . Smoking status: Never Smoker  . Smokeless tobacco: Never Used  . Alcohol use 0.0 oz/week     Comment: occasional alcohol use   No Known Allergies  Prior to Admission medications   Medication Sig Start Date End Date Taking? Authorizing Provider  atenolol (TENORMIN) 50 MG tablet Take 1 tablet (50 mg total) by mouth 2 (two) times daily. 02/13/17  Yes Delma Freezeina A Hackney, FNP  cyclobenzaprine (FLEXERIL) 5 MG tablet Take 5 mg by mouth 3 (three) times daily as needed for muscle spasms.   Yes Historical Provider, MD  diclofenac sodium (VOLTAREN) 1 % GEL Apply 2 g topically 2 (two) times daily  as needed.   Yes Historical Provider, MD  furosemide (LASIX) 80 MG tablet Take 1 tablet (80 mg total) by mouth daily. 02/13/17  Yes Delma Freezeina A Hackney, FNP  lisinopril (PRINIVIL,ZESTRIL) 40 MG tablet Take 1 tablet (40 mg total) by mouth daily. 02/13/17  Yes Delma Freezeina A Hackney, FNP  potassium chloride (KLOR-CON) 20 MEQ packet Take 20 mEq by mouth daily. 10/30/16  Yes Delma Freezeina A Hackney, FNP  ranitidine (ZANTAC) 150 MG tablet Take 150 mg by mouth 2 (two) times daily.   Yes Historical Provider, MD  Zinc Oxide (DR SMITHS DIAPER RASH EX) Apply topically.   Yes Historical Provider, MD  metolazone (ZAROXOLYN) 2.5 MG tablet Take 1 tablet (2.5 mg total) by mouth 2 (two) times a week. 02/16/17   Delma Freezeina A Hackney, FNP   Review of Systems  Constitutional: Positive for fatigue. Negative for appetite change.  HENT: Negative for congestion, postnasal drip and sore throat.   Eyes: Negative.   Respiratory: Positive for cough and shortness of breath. Negative for chest tightness.   Cardiovascular: Positive for leg swelling. Negative for chest pain and palpitations.  Gastrointestinal: Negative for abdominal distention and abdominal pain.  Endocrine: Negative.   Genitourinary: Negative.   Musculoskeletal: Positive for arthralgias (knee pain). Negative for neck pain.  Skin: Negative.   Allergic/Immunologic: Negative.   Neurological: Negative for dizziness and light-headedness.  Hematological: Negative for  adenopathy. Does not bruise/bleed easily.  Psychiatric/Behavioral: Negative for dysphoric mood and sleep disturbance (sleeping on 1 pillow; wears oxygen at 2L ). The patient is not nervous/anxious.    Vitals:   02/13/17 1123 02/13/17 1124  BP:  128/70  Pulse: 76   Resp: 18   SpO2: 100%   Weight: (!) 432 lb 8 oz (196.2 kg)   Height: 5\' 2"  (1.575 m)    Wt Readings from Last 3 Encounters:  02/13/17 (!) 432 lb 8 oz (196.2 kg)  10/24/16 (!) 428 lb (194.1 kg)  07/10/16 (!) 442 lb (200.5 kg)   Lab Results  Component Value  Date   CREATININE 0.65 05/01/2016   CREATININE 0.55 11/06/2015   CREATININE 0.67 11/03/2015    Physical Exam  Constitutional: She is oriented to person, place, and time. She appears well-developed and well-nourished.  HENT:  Head: Normocephalic and atraumatic.  Eyes: Conjunctivae are normal. Pupils are equal, round, and reactive to light.  Neck: Normal range of motion. Neck supple. No JVD present.  Cardiovascular: Normal rate and regular rhythm.   Pulmonary/Chest: Effort normal. She has no wheezes. She has no rales.  Abdominal: Soft. She exhibits no distension. There is no tenderness.  Musculoskeletal: She exhibits edema (chronic lymphedema with L>R). She exhibits no tenderness.  Neurological: She is alert and oriented to person, place, and time.  Skin: Skin is warm and dry.  Psychiatric: She has a normal mood and affect. Her behavior is normal. Thought content normal.  Nursing note and vitals reviewed.  Assessment & Plan:  1: Chronic heart failure with preserved ejection fraction- - NYHA class III - euvolemic today - not weighing daily and she was encouraged to weigh when the aide is there. Nurse is coming every 2 weeks now. Instructed to call for an overnight weight gain of >2 pounds or a weekly weight gain of >5 pounds - needs to see cardiology Alvino Chapel) May 2018 - continue to increase activity using her walker for stability  2: HTN- - BP looks good today - saw PCP 08/18/16  3: Obstructive sleep apnea- - wears oxygen at 2L at bedtime  4: Lymphedema- - appears less swollen than previously although L>R  Medication bottles were reviewed.  Return here in 3 months or sooner for any questions/problems before then.

## 2017-02-13 NOTE — Patient Instructions (Signed)
Resume weighing daily and call for an overnight weight gain of > 2 pounds or a weekly weight gain of >5 pounds. 

## 2017-03-04 ENCOUNTER — Telehealth: Payer: Self-pay

## 2017-03-04 NOTE — Telephone Encounter (Signed)
Melinda advised.

## 2017-03-04 NOTE — Telephone Encounter (Addendum)
[name] with [facility] called the office she states that [name] is up 11 pounds since her last visit to the home 6 days ago. She does report that she missed he dose of Lasix on Monday because she thought she was going to an appointment. She took her Metolazone yesterday and had her Lasix this morning. Juliette AlcideMelinda wants to see if you wanted to make any changes to her Lasix at this time or just leave it the way it is. Please advise.

## 2017-05-12 ENCOUNTER — Encounter: Payer: Self-pay | Admitting: Family

## 2017-05-12 ENCOUNTER — Ambulatory Visit: Payer: Medicaid Other | Attending: Family | Admitting: Family

## 2017-05-12 VITALS — BP 110/70 | HR 80 | Resp 20 | Ht 62.0 in | Wt >= 6400 oz

## 2017-05-12 DIAGNOSIS — I890 Lymphedema, not elsewhere classified: Secondary | ICD-10-CM | POA: Insufficient documentation

## 2017-05-12 DIAGNOSIS — Z79899 Other long term (current) drug therapy: Secondary | ICD-10-CM | POA: Diagnosis not present

## 2017-05-12 DIAGNOSIS — I10 Essential (primary) hypertension: Secondary | ICD-10-CM | POA: Diagnosis present

## 2017-05-12 DIAGNOSIS — Z8249 Family history of ischemic heart disease and other diseases of the circulatory system: Secondary | ICD-10-CM | POA: Diagnosis not present

## 2017-05-12 DIAGNOSIS — K219 Gastro-esophageal reflux disease without esophagitis: Secondary | ICD-10-CM | POA: Insufficient documentation

## 2017-05-12 DIAGNOSIS — G4733 Obstructive sleep apnea (adult) (pediatric): Secondary | ICD-10-CM | POA: Diagnosis not present

## 2017-05-12 DIAGNOSIS — F329 Major depressive disorder, single episode, unspecified: Secondary | ICD-10-CM | POA: Diagnosis not present

## 2017-05-12 DIAGNOSIS — Z6841 Body Mass Index (BMI) 40.0 and over, adult: Secondary | ICD-10-CM | POA: Diagnosis not present

## 2017-05-12 DIAGNOSIS — I5032 Chronic diastolic (congestive) heart failure: Secondary | ICD-10-CM | POA: Diagnosis not present

## 2017-05-12 DIAGNOSIS — Z823 Family history of stroke: Secondary | ICD-10-CM | POA: Diagnosis not present

## 2017-05-12 DIAGNOSIS — I110 Hypertensive heart disease with heart failure: Secondary | ICD-10-CM | POA: Insufficient documentation

## 2017-05-12 NOTE — Patient Instructions (Signed)
Resume weighing daily and call for an overnight weight gain of > 2 pounds or a weekly weight gain of >5 pounds. 

## 2017-05-12 NOTE — Progress Notes (Signed)
Patient ID: [name]. Frances, female    DOB: Jun 21, 1961, 56 y.o.   MRN: [id number]  HPI  [name] is a 56 y/o female with a history of obstructive sleep apnea, morbid obesity, lymphedema, HTN, GERD, depression and chronic heart failure.  Last echo was done 05/07/16 with an EF of 60-65%. No valvular regurgitation. Technically difficult study however due to body habitus and patient was in a wheelchair.   Last admission was 11/01/15 for left arm & left breast cellulitis. Was treated with IV antibiotics and discharged on oral antibiotics. Was discharged with oxygen at bedtime as well. Was discharged on day 5 with home health set up.  She presents today for a follow-up visit with a chief complaint of mild shortness of breath with moderate exertion. She describes this as chronic in nature and having been present for the last few years with waxing/waning of severity. She has associated fatigue, lymphedema and weight gain along with this.   Past Medical History:  Diagnosis Date  . Depression   . GERD (gastroesophageal reflux disease)   . Hypertension   . Lymphedema   . Morbid obesity (HCC)   . Sleep apnea    No past surgical history on file.  Family History  Problem Relation Age of Onset  . CVA Father   . Hypertension Father   . Hypertension Mother    Social History  Substance Use Topics  . Smoking status: Never Smoker  . Smokeless tobacco: Never Used  . Alcohol use 0.0 oz/week     Comment: occasional alcohol use   No Known Allergies  Prior to Admission medications   Medication Sig Start Date End Date Taking? Authorizing Provider  atenolol (TENORMIN) 50 MG tablet Take 1 tablet (50 mg total) by mouth 2 (two) times daily. 02/13/17  Yes Clarisa Kindred A, FNP  cyclobenzaprine (FLEXERIL) 5 MG tablet Take 5 mg by mouth 3 (three) times daily as needed for muscle spasms.   Yes [provider]  diclofenac sodium (VOLTAREN) 1 % GEL Apply 2 g topically 2 (two) times daily as needed.   Yes  [provider]  furosemide (LASIX) 80 MG tablet Take 1 tablet (80 mg total) by mouth daily. 02/13/17  Yes Anwar Sakata, Inetta Fermo A, FNP  lisinopril (PRINIVIL,ZESTRIL) 40 MG tablet Take 1 tablet (40 mg total) by mouth daily. 02/13/17  Yes Clarisa Kindred A, FNP  metolazone (ZAROXOLYN) 2.5 MG tablet Take 1 tablet (2.5 mg total) by mouth 2 (two) times a week. 02/16/17  Yes Aven Cegielski, Inetta Fermo A, FNP  potassium chloride (KLOR-CON) 20 MEQ packet Take 20 mEq by mouth daily. 10/30/16  Yes Ron Beske, Inetta Fermo A, FNP  ranitidine (ZANTAC) 150 MG tablet Take 150 mg by mouth 2 (two) times daily.   Yes [provider]  Zinc Oxide (DR SMITHS DIAPER RASH EX) Apply topically.    [provider]    Review of Systems  Constitutional: Positive for fatigue. Negative for appetite change.  HENT: Negative for congestion, postnasal drip and sore throat.   Eyes: Negative.   Respiratory: Positive for shortness of breath. Negative for cough and chest tightness.   Cardiovascular: Positive for leg swelling. Negative for chest pain and palpitations.  Gastrointestinal: Negative for abdominal distention and abdominal pain.  Endocrine: Negative.   Genitourinary: Negative.   Musculoskeletal: Positive for arthralgias (knee pain).  Skin: Negative.   Allergic/Immunologic: Negative.   Neurological: Positive for dizziness. Negative for light-headedness.  Hematological: Negative for adenopathy. Does not bruise/bleed easily.  Psychiatric/Behavioral: Negative  for dysphoric mood and sleep disturbance (sleeping on 1 pillow; wears oxygen at 2L ). The patient is not nervous/anxious.    Vitals:   05/12/17 1109  BP: 110/70  Pulse: 80  Resp: 20  SpO2: 97%  Weight: (!) 441 lb 6 oz (200.2 kg)  Height: 5\' 2"  (1.575 m)   Wt Readings from Last 3 Encounters:  05/12/17 (!) 441 lb 6 oz (200.2 kg)  02/13/17 (!) 432 lb 8 oz (196.2 kg)  10/24/16 (!) 428 lb (194.1 kg)   Lab Results  Component Value Date   CREATININE 0.65 05/01/2016    CREATININE 0.55 11/06/2015   CREATININE 0.67 11/03/2015    Physical Exam  Constitutional: She is oriented to person, place, and time. She appears well-developed and well-nourished.  HENT:  Head: Normocephalic and atraumatic.  Neck: Normal range of motion. Neck supple. No JVD present.  Cardiovascular: Normal rate and regular rhythm.   Pulmonary/Chest: Effort normal. She has no wheezes. She has no rales.  Abdominal: Soft. She exhibits no distension. There is no tenderness.  Musculoskeletal: She exhibits edema (chronic lymphedema with L>R). She exhibits no tenderness.  Neurological: She is alert and oriented to person, place, and time.  Skin: Skin is warm and dry.  Psychiatric: She has a normal mood and affect. Her behavior is normal. Thought content normal.  Nursing note and vitals reviewed.  Assessment & Plan:  1: Chronic heart failure with preserved ejection fraction- - NYHA class III - euvolemic today - not weighing daily and she was encouraged to weigh when the aide is there. Nurse is coming every 2 weeks now. Instructed to call for an overnight weight gain of >2 pounds or a weekly weight gain of >5 pounds - weight up 9 pounds by our scale - saw cardiologist Alvino Chapel([place]) May 2017; will have CMA schedule a follow-up appointment - continue to increase activity using her walker for stability - not adding salt to her food  2: HTN- - BP looks good today - saw PCP 08/18/16 - will have CMA schedule a follow-up with PCP  3: Obstructive sleep apnea- - wears oxygen at 2L at bedtime and during the day as needed  4: Lymphedema- - edema stable with L>R  Medication bottles were reviewed.  Return here in 3 months or sooner for any questions/problems before then.

## 2017-07-15 ENCOUNTER — Ambulatory Visit: Payer: Medicaid Other | Admitting: Internal Medicine

## 2017-07-22 ENCOUNTER — Other Ambulatory Visit
Admission: RE | Admit: 2017-07-22 | Discharge: 2017-07-22 | Disposition: A | Discharge status: Home / Self Care | Payer: Medicaid Other | Source: Ambulatory Visit | Attending: Internal Medicine | Admitting: Internal Medicine

## 2017-07-22 ENCOUNTER — Ambulatory Visit (INDEPENDENT_AMBULATORY_CARE_PROVIDER_SITE_OTHER): Payer: Medicaid Other | Admitting: Internal Medicine

## 2017-07-22 ENCOUNTER — Encounter: Payer: Self-pay | Admitting: Internal Medicine

## 2017-07-22 VITALS — BP 124/75 | HR 69 | Ht 62.0 in | Wt >= 6400 oz

## 2017-07-22 DIAGNOSIS — I10 Essential (primary) hypertension: Secondary | ICD-10-CM | POA: Diagnosis not present

## 2017-07-22 DIAGNOSIS — R0602 Shortness of breath: Secondary | ICD-10-CM

## 2017-07-22 DIAGNOSIS — R079 Chest pain, unspecified: Secondary | ICD-10-CM | POA: Insufficient documentation

## 2017-07-22 DIAGNOSIS — R0789 Other chest pain: Secondary | ICD-10-CM

## 2017-07-22 DIAGNOSIS — I5030 Unspecified diastolic (congestive) heart failure: Secondary | ICD-10-CM | POA: Diagnosis present

## 2017-07-22 DIAGNOSIS — I5032 Chronic diastolic (congestive) heart failure: Secondary | ICD-10-CM | POA: Insufficient documentation

## 2017-07-22 LAB — CBC WITH DIFFERENTIAL/PLATELET
BASOS ABS: 0.1 10*3/uL (ref 0–0.1)
BASOS PCT: 1 %
EOS PCT: 2 %
Eosinophils Absolute: 0.1 10*3/uL (ref 0–0.7)
HEMATOCRIT: 36.6 % (ref 35.0–47.0)
Hemoglobin: 12.5 g/dL (ref 12.0–16.0)
Lymphocytes Relative: 40 %
Lymphs Abs: 2.7 10*3/uL (ref 1.0–3.6)
MCH: 34.2 pg — ABNORMAL HIGH (ref 26.0–34.0)
MCHC: 34.1 g/dL (ref 32.0–36.0)
MCV: 100.4 fL — AB (ref 80.0–100.0)
MONO ABS: 0.7 10*3/uL (ref 0.2–0.9)
MONOS PCT: 10 %
NEUTROS ABS: 3.2 10*3/uL (ref 1.4–6.5)
Neutrophils Relative %: 47 %
Platelets: 191 10*3/uL (ref 150–440)
RBC: 3.64 MIL/uL — ABNORMAL LOW (ref 3.80–5.20)
RDW: 13.3 % (ref 11.5–14.5)
WBC: 6.7 10*3/uL (ref 3.6–11.0)

## 2017-07-22 LAB — COMPREHENSIVE METABOLIC PANEL
ALBUMIN: 3.8 g/dL (ref 3.5–5.0)
ALK PHOS: 56 U/L (ref 38–126)
ALT: 11 U/L — AB (ref 14–54)
ANION GAP: 10 (ref 5–15)
AST: 18 U/L (ref 15–41)
BILIRUBIN TOTAL: 0.5 mg/dL (ref 0.3–1.2)
BUN: 32 mg/dL — AB (ref 6–20)
CALCIUM: 8.7 mg/dL — AB (ref 8.9–10.3)
CO2: 31 mmol/L (ref 22–32)
Chloride: 98 mmol/L — ABNORMAL LOW (ref 101–111)
Creatinine, Ser: 1.17 mg/dL — ABNORMAL HIGH (ref 0.44–1.00)
GFR calc Af Amer: 59 mL/min — ABNORMAL LOW (ref >60)
GFR calc non Af Amer: 51 mL/min — ABNORMAL LOW (ref >60)
GLUCOSE: 109 mg/dL — AB (ref 65–99)
Potassium: 3.6 mmol/L (ref 3.5–5.1)
SODIUM: 139 mmol/L (ref 135–145)
TOTAL PROTEIN: 7.7 g/dL (ref 6.5–8.1)

## 2017-07-22 LAB — LIPID PANEL
CHOLESTEROL: 151 mg/dL (ref 0–200)
HDL: 52 mg/dL (ref >40)
LDL CALC: 78 mg/dL (ref 0–99)
TRIGLYCERIDES: 106 mg/dL (ref <150)
Total CHOL/HDL Ratio: 2.9 RATIO
VLDL: 21 mg/dL (ref 0–40)

## 2017-07-22 MED ORDER — ASPIRIN EC 81 MG PO TBEC: 81.0000 mg | DELAYED_RELEASE_TABLET | Freq: Every day | ORAL | 3 refills | Status: DC

## 2017-07-22 NOTE — Patient Instructions (Signed)
Medication Instructions:  Your physician has recommended you make the following change in your medication:  1- START Aspirin 81 mg by mouth once a day.   Labwork: Your physician recommends that you return for lab work in: TODAY (CMP, CBC, LIPID). - [facility] You will check in at the front desk to the right as you walk into the atrium. Valet Parking is offered if needed.     Testing/Procedures: none  Follow-Up: Your physician wants you to follow-up in: 6 MONTHS WITH DR END. You will receive a reminder letter in the mail two months in advance. If you don't receive a letter, please call our office to schedule the follow-up appointment.  If you need a refill on your cardiac medications before your next appointment, please call your pharmacy.

## 2017-07-22 NOTE — Progress Notes (Signed)
Follow-up Outpatient Visit Date: 07/22/2017  Primary Care Provider: System, Pcp Not In No address on file  Chief Complaint: Shortness of breath  HPI:  [name] is a 56 y.o. year-old female with history of heart failure with preserved ejection fraction, hypertension, morbid obesity, obstructive sleep apnea, lymphedema, GERD, and depression, who presents for follow-up of chronic diastolic heart failure. She previously followed with Dr. Alvino Huerta and has also been seen in the heart failure clinic. She was last seen by Dr. Alvino Huerta in 04/2016.  Today, [name] reports that her exertional dyspnea has improved since her last visit. Her swelling is also well controlled. Her weight is up and down but generally relatively stable. She typically wears supplemental oxygen at home but did not bring it with her today. She is not using CPAP, as her machine is greater than 58 years old. She is waiting to establish with her new PCP in [place] in order to be referred for a repeat sleep study. She denies palpitations and lightheadedness. She notes occasional "heaviness" in her chest that lasts a few minutes. This usually occurs when she is watching a scary movie on television. She does not have any exertional chest discomfort.  --------------------------------------------------------------------------------------------------  Cardiovascular History & Procedures: Cardiovascular Problems:  Heart failure with preserved ejection fraction  Risk Factors:  Hypertension, obesity, and sedentary lifestyle  Cath/PCI:  None  CV Surgery:  None  EP Procedures and Devices:  None  Non-Invasive Evaluation(s):  TTE (05/07/16): Normal LV size with LVEF of 60-65%. Normal RV size and function. Challenging study due to body habitus and patient in wheelchair.  Recent CV Pertinent Labs: Lab Results  Component Value Date   BNP 254 (H) 02/12/2013   K 4.0 05/01/2016   K 3.6 02/13/2013   BUN 15 05/01/2016   BUN 9 02/13/2013    CREATININE 0.65 05/01/2016   CREATININE 0.93 02/17/2013    Past medical and surgical history were reviewed and updated in EPIC.  Current Meds  Medication Sig  . atenolol (TENORMIN) 50 MG tablet Take 1 tablet (50 mg total) by mouth 2 (two) times daily.  . cyclobenzaprine (FLEXERIL) 5 MG tablet Take 5 mg by mouth 3 (three) times daily as needed for muscle spasms.  . diclofenac sodium (VOLTAREN) 1 % GEL Apply 2 g topically 2 (two) times daily as needed.  . furosemide (LASIX) 80 MG tablet Take 1 tablet (80 mg total) by mouth daily.  Marland Kitchen. lisinopril (PRINIVIL,ZESTRIL) 40 MG tablet Take 1 tablet (40 mg total) by mouth daily.  . metolazone (ZAROXOLYN) 2.5 MG tablet Take 1 tablet (2.5 mg total) by mouth 2 (two) times a week.  . potassium chloride (KLOR-CON) 20 MEQ packet Take 20 mEq by mouth daily.  . ranitidine (ZANTAC) 150 MG tablet Take 150 mg by mouth 2 (two) times daily.  . Zinc Oxide (DR SMITHS DIAPER RASH EX) Apply topically.    Allergies: Patient has no known allergies.  Social History   Social History  . Marital status: Divorced    Spouse name: N/A  . Number of children: N/A  . Years of education: N/A   Occupational History  . Not on file.   Social History Main Topics  . Smoking status: Never Smoker  . Smokeless tobacco: Never Used  . Alcohol use 0.0 oz/week     Comment: occasional alcohol use  . Drug use: No  . Sexual activity: Not on file   Other Topics Concern  . Not on file   Social History  Narrative   Lives by herself. Daughter checks on her. Ambulates with walker    Family History  Problem Relation Age of Onset  . CVA Father   . Hypertension Father   . Hypertension Mother     Review of Systems: A 12-system review of systems was performed and was negative except as noted in the HPI.  --------------------------------------------------------------------------------------------------  Physical Exam: BP 124/75 (BP Location: Left Wrist, Patient Position:  Sitting, Cuff Size: Normal)   Pulse 69   Ht 5\' 2"  (1.575 m)   Wt (!) 449 lb 8 oz (203.9 kg)   BMI 82.21 kg/m   General:  Morbidly obese woman, seated comfortably in the exam room. HEENT: No conjunctival pallor or scleral icterus. Moist mucous membranes.  OP clear. Neck: Supple without lymphadenopathy or thyromegaly. No gross JVD, though evaluation is limited by body habitus. Lungs: Normal work of breathing. Clear to auscultation bilaterally without wheezes or crackles. Heart: Regular rate and rhythm without murmurs, rubs, or gallops. Unable to assess PMI due to body habitus. Abd: Bowel sounds present. Soft, NT/ND. Unable to assess HSM due to body habitus. Ext: 1+ nonpitting edema bilaterally in the lower extremities. Radial, PT, and DP pulses are 2+ bilaterally. Skin: Warm and dry without rash.  EKG:  Normal sinus rhythm without abnormalities.  Lab Results  Component Value Date   WBC 6.8 11/02/2015   HGB 13.9 11/02/2015   HCT 43.0 11/02/2015   MCV 116.2 (H) 11/02/2015   PLT 178 11/02/2015    Lab Results  Component Value Date   NA 140 05/01/2016   K 4.0 05/01/2016   CL 102 05/01/2016   CO2 31 05/01/2016   BUN 15 05/01/2016   CREATININE 0.65 05/01/2016   GLUCOSE 103 (H) 05/01/2016   ALT 16 05/01/2016    No results found for: CHOL, HDL, LDLCALC, LDLDIRECT, TRIG, CHOLHDL  --------------------------------------------------------------------------------------------------  ASSESSMENT AND PLAN: Heart failure with preserved ejection fraction It is difficult to assess [name]'s volume status due to her morbid obesity. Her weight is up and down but fairly close to her baseline. Symptomatically, she has improved over a year ago. We will continue her current medication regimen. I will check a complete metabolic panel today to ensure stable kidney function and electrolytes in the setting of chronic furosemide and metolazone use.  Atypical chest pain Pain occurs only when watching  skin removed the or something anxiety provoking on television. She is otherwise asymptomatic. Symptoms have been present for at least a year and are not changing significantly. We discussed monitoring her symptoms versus ischemia evaluation. Due to her morbid obesity, any sort of ischemia evaluation would be challenging. We have agreed to defer additional testing unless her symptoms worsen. If testing is needed, [facility]. In the meantime, we will start aspirin 81 mg daily. I will also check a fasting lipid panel and CBC today for risk stratification and to exclude significant anemia.  Hypertension Blood pressure is well controlled today. No medication changes at this time.  Follow-up: Return to clinic in 6 months.  Yvonne Kendallhristopher Victoriya Pol, MD 07/22/2017 7:58 AM

## 2017-08-06 ENCOUNTER — Telehealth: Payer: Self-pay | Admitting: Family

## 2017-08-06 NOTE — Telephone Encounter (Signed)
[name] ([facility]) called to discuss patient's weight. Patient was taking 2.5mg  metolazone twice a week until cardiologist decreased it to once a week after getting lab work back from 07/22/17.   On 07/23/17 [name] weighed patient and she was 444 pounds (was 449 pounds at cardiologist office) and on 07/28/17 patient "thinks" she weighed 438 pounds. Today [name] weighed patient and she weighs 452 pounds but patient did not take her metolazone last week because she went out of town.   Sat is 98%, lungs are clear, BP is 120/78 and no edema per [name]'s exam. Patient did take 2.5mg  metolazone this morning.   Bailey Sabaldvised [name] to go back to the home tomorrow to re-weigh patient and call us back for further instruction. Also asked her to weigh her every time she goes to see her.

## 2017-08-12 ENCOUNTER — Ambulatory Visit: Payer: Medicaid Other | Admitting: Family

## 2017-08-14 ENCOUNTER — Encounter: Payer: Self-pay | Admitting: Family

## 2017-08-14 ENCOUNTER — Ambulatory Visit: Payer: Medicaid Other | Attending: Family | Admitting: Family

## 2017-08-14 VITALS — BP 110/60 | HR 85 | Resp 18 | Ht 62.0 in | Wt >= 6400 oz

## 2017-08-14 DIAGNOSIS — I110 Hypertensive heart disease with heart failure: Secondary | ICD-10-CM | POA: Insufficient documentation

## 2017-08-14 DIAGNOSIS — I890 Lymphedema, not elsewhere classified: Secondary | ICD-10-CM | POA: Diagnosis not present

## 2017-08-14 DIAGNOSIS — Z7982 Long term (current) use of aspirin: Secondary | ICD-10-CM | POA: Insufficient documentation

## 2017-08-14 DIAGNOSIS — F329 Major depressive disorder, single episode, unspecified: Secondary | ICD-10-CM | POA: Insufficient documentation

## 2017-08-14 DIAGNOSIS — G4733 Obstructive sleep apnea (adult) (pediatric): Secondary | ICD-10-CM | POA: Insufficient documentation

## 2017-08-14 DIAGNOSIS — K219 Gastro-esophageal reflux disease without esophagitis: Secondary | ICD-10-CM | POA: Diagnosis not present

## 2017-08-14 DIAGNOSIS — Z79899 Other long term (current) drug therapy: Secondary | ICD-10-CM | POA: Insufficient documentation

## 2017-08-14 DIAGNOSIS — I10 Essential (primary) hypertension: Secondary | ICD-10-CM

## 2017-08-14 DIAGNOSIS — I5032 Chronic diastolic (congestive) heart failure: Secondary | ICD-10-CM | POA: Insufficient documentation

## 2017-08-14 DIAGNOSIS — I5030 Unspecified diastolic (congestive) heart failure: Secondary | ICD-10-CM

## 2017-08-14 LAB — BASIC METABOLIC PANEL
ANION GAP: 12 (ref 5–15)
BUN: 25 mg/dL — ABNORMAL HIGH (ref 6–20)
CO2: 34 mmol/L — ABNORMAL HIGH (ref 22–32)
Calcium: 9.4 mg/dL (ref 8.9–10.3)
Chloride: 92 mmol/L — ABNORMAL LOW (ref 101–111)
Creatinine, Ser: 0.98 mg/dL (ref 0.44–1.00)
Glucose, Bld: 96 mg/dL (ref 65–99)
POTASSIUM: 3.6 mmol/L (ref 3.5–5.1)
SODIUM: 138 mmol/L (ref 135–145)

## 2017-08-14 NOTE — Patient Instructions (Signed)
Continue weighing daily and call for an overnight weight gain of > 2 pounds or a weekly weight gain of >5 pounds. 

## 2017-08-14 NOTE — Progress Notes (Signed)
Patient ID: [name], female    DOB: 1961/02/07, 56 y.o.   MRN: [id number]  HPI  Ms Pfiester is a 56 y/o female with a history of obstructive sleep apnea, morbid obesity, lymphedema, HTN, GERD, depression and chronic heart failure.  Last echo was done 05/07/16 with an EF of 60-65%. No valvular regurgitation. Technically difficult study however due to body habitus and patient was in a wheelchair.   Last admission was 11/01/15 for left arm & left breast cellulitis. Was treated with IV antibiotics and discharged on oral antibiotics. Was discharged with oxygen at bedtime as well. Was discharged on day 5 with home health set up.  She presents today for a follow-up visit with a chief complaint of mild shortness of breath upon minimal exertion. She describes it as chronic in nature having been present for many years with varying level of severity. She has associated fatigue, edema, dizziness and weight gain along with this. Metolazone has been decreased to once weekly. Patient admits that she doesn't get any exercise due to chronic knee pain  Past Medical History:  Diagnosis Date  . Depression   . GERD (gastroesophageal reflux disease)   . Hypertension   . Lymphedema   . Morbid obesity (HCC)   . Sleep apnea    No past surgical history on file.  Family History  Problem Relation Age of Onset  . CVA Father   . Hypertension Father   . Hypertension Mother    Social History  Substance Use Topics  . Smoking status: Never Smoker  . Smokeless tobacco: Never Used  . Alcohol use 0.0 oz/week     Comment: occasional alcohol use   No Known Allergies  Prior to Admission medications   Medication Sig Start Date End Date Taking? Authorizing Provider  aspirin EC 81 MG tablet Take 1 tablet (81 mg total) by mouth daily. 07/22/17  Yes End, Cristal Deer, MD  atenolol (TENORMIN) 50 MG tablet Take 1 tablet (50 mg total) by mouth 2 (two) times daily. 02/13/17  Yes Clarisa Kindred A, FNP  diclofenac sodium  (VOLTAREN) 1 % GEL Apply 2 g topically 2 (two) times daily as needed.   Yes [provider]  furosemide (LASIX) 80 MG tablet Take 1 tablet (80 mg total) by mouth daily. 02/13/17  Yes Schyler Butikofer, Inetta Fermo A, FNP  lisinopril (PRINIVIL,ZESTRIL) 40 MG tablet Take 1 tablet (40 mg total) by mouth daily. 02/13/17  Yes Clarisa Kindred A, FNP  metolazone (ZAROXOLYN) 2.5 MG tablet Take 1 tablet (2.5 mg total) by mouth 2 (two) times a week. Patient taking differently: Take 2.5 mg by mouth once a week.  02/16/17  Yes Brinae Woods A, FNP  polycarbophil (FIBERCON) 625 MG tablet Take 625 mg by mouth daily.   Yes [provider]  potassium chloride (KLOR-CON) 20 MEQ packet Take 20 mEq by mouth daily. 10/30/16  Yes Farley Crooker, Inetta Fermo A, FNP  ranitidine (ZANTAC) 150 MG tablet Take 150 mg by mouth 2 (two) times daily.   Yes [provider]  Vitamins A & D ([employer] VITAMIN A & D EX) Apply 1 application topically 2 (two) times daily as needed.   Yes [provider]  Zinc Oxide (DR SMITHS DIAPER RASH EX) Apply topically.   Yes [provider]    Review of Systems  Constitutional: Positive for fatigue. Negative for appetite change.  HENT: Negative for congestion, postnasal drip and sore throat.   Eyes: Negative.   Respiratory: Positive for shortness of breath. Negative  for cough and chest tightness.   Cardiovascular: Positive for leg swelling. Negative for chest pain and palpitations.  Gastrointestinal: Negative for abdominal distention and abdominal pain.  Endocrine: Negative.   Genitourinary: Negative.   Musculoskeletal: Positive for arthralgias (knee pain) and back pain.  Skin: Negative.   Allergic/Immunologic: Negative.   Neurological: Positive for dizziness. Negative for light-headedness.  Hematological: Negative for adenopathy. Does not bruise/bleed easily.  Psychiatric/Behavioral: Positive for dysphoric mood (about weight). Negative for sleep disturbance (sleeping on 1 pillow; wears  oxygen at 2L ). The patient is not nervous/anxious.    Vitals:   08/14/17 0947  BP: 110/60  Pulse: 85  Resp: 18  SpO2: 98%  Weight: (!) 451 lb 6 oz (204.7 kg)  Height: 5\' 2"  (1.575 m)   Wt Readings from Last 3 Encounters:  08/14/17 (!) 451 lb 6 oz (204.7 kg)  07/22/17 (!) 449 lb 8 oz (203.9 kg)  05/12/17 (!) 441 lb 6 oz (200.2 kg)    Lab Results  Component Value Date   CREATININE 1.17 (H) 07/22/2017   CREATININE 0.65 05/01/2016   CREATININE 0.55 11/06/2015    Physical Exam  Constitutional: She is oriented to person, place, and time. She appears well-developed and well-nourished.  HENT:  Head: Normocephalic and atraumatic.  Neck: Normal range of motion. Neck supple. No JVD present.  Cardiovascular: Normal rate and regular rhythm.   Pulmonary/Chest: Effort normal. She has no wheezes. She has no rales.  Abdominal: Soft. She exhibits no distension. There is no tenderness.  Musculoskeletal: She exhibits edema (chronic lymphedema with L>R). She exhibits no tenderness.  Neurological: She is alert and oriented to person, place, and time.  Skin: Skin is warm and dry.  Psychiatric: She has a normal mood and affect. Her behavior is normal. Thought content normal.  Nursing note and vitals reviewed.  Assessment & Plan:  1: Chronic heart failure with preserved ejection fraction- - NYHA class III - difficult to assess fluid status due to patient's weight - not weighing consistently and she was encouraged to weigh when the aide is there. [facility] nurse is coming every 2 weeks now. Instructed to call for an overnight weight gain of >2 pounds or a weekly weight gain of >5 pounds - weight up 10 pounds since she was last here on 05/12/17 and 23.6 pounds in the last year - saw cardiologist (End) 07/22/17 & metolazone decreased to 2.5mg  weekly instead of twice weekly - BMP 07/22/17 reviewed and shows sodium 139, potassium 3.6, Cr 1.17 and GFR 59 - will repeat BMP today to see how  renal function is looking - admits to not getting any exercise due to chronic bilateral knee pain; does use a walker when she's up walking around - not adding salt to her food - drinks ~ 64 ounces of water daily  2: HTN- - BP looks good today - sees PCP (McDonald) 08/20/17  3: Obstructive sleep apnea- - wears oxygen at 2L at bedtime and during the day as needed - going to speak with PCP about repeat sleep study  4: Lymphedema- - Stage 3 lymphedema - unable to wear compression socks due to body habitus - elevates legs when she can but edema persists - discussed lymphapress boots and she is interested so will make that referral  Medication bottles were reviewed.  Return here in 3 months or sooner for any questions/problems before then.

## 2017-09-03 ENCOUNTER — Telehealth: Payer: Self-pay

## 2017-09-03 ENCOUNTER — Other Ambulatory Visit: Payer: Self-pay | Admitting: Family

## 2017-09-03 MED ORDER — METOLAZONE 2.5 MG PO TABS
2.5000 mg | ORAL_TABLET | ORAL | 5 refills | Status: DC
Start: 2017-09-03 — End: 2017-11-10

## 2017-09-03 NOTE — Telephone Encounter (Signed)
Spoke with Exelon CorporationLinda Click and advised her that I refilled the metolazone 2.5mg  weekly.

## 2017-09-03 NOTE — Telephone Encounter (Signed)
[name] would like to know if the metolazone can be refilled for [name]

## 2017-11-10 ENCOUNTER — Encounter: Payer: Self-pay | Admitting: Family

## 2017-11-10 ENCOUNTER — Ambulatory Visit: Payer: Medicaid Other | Attending: Family | Admitting: Family

## 2017-11-10 ENCOUNTER — Other Ambulatory Visit: Payer: Self-pay

## 2017-11-10 VITALS — BP 110/80 | HR 67 | Resp 18 | Ht 62.0 in | Wt >= 6400 oz

## 2017-11-10 DIAGNOSIS — Z79899 Other long term (current) drug therapy: Secondary | ICD-10-CM | POA: Insufficient documentation

## 2017-11-10 DIAGNOSIS — I110 Hypertensive heart disease with heart failure: Secondary | ICD-10-CM | POA: Insufficient documentation

## 2017-11-10 DIAGNOSIS — M25562 Pain in left knee: Secondary | ICD-10-CM | POA: Insufficient documentation

## 2017-11-10 DIAGNOSIS — M25561 Pain in right knee: Secondary | ICD-10-CM | POA: Diagnosis not present

## 2017-11-10 DIAGNOSIS — F329 Major depressive disorder, single episode, unspecified: Secondary | ICD-10-CM | POA: Insufficient documentation

## 2017-11-10 DIAGNOSIS — Z7982 Long term (current) use of aspirin: Secondary | ICD-10-CM | POA: Insufficient documentation

## 2017-11-10 DIAGNOSIS — Z6841 Body Mass Index (BMI) 40.0 and over, adult: Secondary | ICD-10-CM | POA: Diagnosis not present

## 2017-11-10 DIAGNOSIS — I5032 Chronic diastolic (congestive) heart failure: Secondary | ICD-10-CM | POA: Insufficient documentation

## 2017-11-10 DIAGNOSIS — G4730 Sleep apnea, unspecified: Secondary | ICD-10-CM | POA: Diagnosis not present

## 2017-11-10 DIAGNOSIS — I509 Heart failure, unspecified: Secondary | ICD-10-CM | POA: Diagnosis present

## 2017-11-10 DIAGNOSIS — I5030 Unspecified diastolic (congestive) heart failure: Secondary | ICD-10-CM

## 2017-11-10 DIAGNOSIS — K219 Gastro-esophageal reflux disease without esophagitis: Secondary | ICD-10-CM | POA: Diagnosis not present

## 2017-11-10 DIAGNOSIS — I10 Essential (primary) hypertension: Secondary | ICD-10-CM

## 2017-11-10 DIAGNOSIS — Z823 Family history of stroke: Secondary | ICD-10-CM | POA: Diagnosis not present

## 2017-11-10 DIAGNOSIS — Z8249 Family history of ischemic heart disease and other diseases of the circulatory system: Secondary | ICD-10-CM | POA: Insufficient documentation

## 2017-11-10 DIAGNOSIS — G4733 Obstructive sleep apnea (adult) (pediatric): Secondary | ICD-10-CM | POA: Diagnosis not present

## 2017-11-10 DIAGNOSIS — I890 Lymphedema, not elsewhere classified: Secondary | ICD-10-CM | POA: Diagnosis not present

## 2017-11-10 MED ORDER — METOLAZONE 2.5 MG PO TABS: 2.5000 mg | ORAL_TABLET | ORAL | 5 refills | Status: DC

## 2017-11-10 NOTE — Progress Notes (Signed)
Patient ID: [name], female    DOB: 02/02/1961, 56 y.o.   MRN: [id number]  HPI  Ms [name] is a 56 y/o female with a history of obstructive sleep apnea, morbid obesity, lymphedema, HTN, GERD, depression and chronic heart failure.  Last echo was done 05/07/16 with an EF of 60-65%. No valvular regurgitation. Technically difficult study however due to body habitus and patient was in a wheelchair.   She has not been admitted or been in the ED in the last 6 months.   She presents today for a follow-up visit with a chief complaint of moderate shortness of breath upon minimal exertion. She describes this as chronic in nature having been present for several years with varying levels of severity. She has associated fatigue, cough, light-headedness, edema and depression. She denies any chest pain, palpitations or difficulty sleeping.   Past Medical History:  Diagnosis Date  . CHF (congestive heart failure) (HCC)   . Depression   . GERD (gastroesophageal reflux disease)   . Hypertension   . Lymphedema   . Morbid obesity (HCC)   . Sleep apnea    No past surgical history on file.  Family History  Problem Relation Age of Onset  . CVA Father   . Hypertension Father   . Hypertension Mother    Social History   Tobacco Use  . Smoking status: Never Smoker  . Smokeless tobacco: Never Used  Substance Use Topics  . Alcohol use: Yes    Alcohol/week: 0.0 oz    Comment: occasional alcohol use   No Known Allergies  Prior to Admission medications   Medication Sig Start Date End Date Taking? Authorizing Provider  aspirin EC 81 MG tablet Take 1 tablet (81 mg total) by mouth daily. 07/22/17  Yes End, Cristal Deerhristopher, MD  atenolol (TENORMIN) 50 MG tablet Take 1 tablet (50 mg total) by mouth 2 (two) times daily. 02/13/17  Yes Clarisa KindredHackney, Jamani Bearce A, FNP  diclofenac sodium (VOLTAREN) 1 % GEL Apply 2 g topically 2 (two) times daily as needed.   Yes [provider]  Dimethicone-Zinc Oxide (SOOTHE & COOL  INZO BARRIER) 5-5 % CREA Apply 1 application topically as needed.   Yes [provider]  furosemide (LASIX) 80 MG tablet Take 1 tablet (80 mg total) by mouth daily. 02/13/17  Yes Edrie Ehrich, Inetta Fermoina A, FNP  lisinopril (PRINIVIL,ZESTRIL) 40 MG tablet Take 1 tablet (40 mg total) by mouth daily. 02/13/17  Yes Clarisa KindredHackney, Rowyn Spilde A, FNP  metolazone (ZAROXOLYN) 2.5 MG tablet Take 1 tablet (2.5 mg total) by mouth once a week. 09/03/17  Yes Othell Diluzio, Inetta Fermoina A, FNP  ranitidine (ZANTAC) 150 MG tablet Take 150 mg by mouth 2 (two) times daily.   Yes [provider]  Zinc Oxide (DR SMITHS DIAPER RASH EX) Apply topically.    [provider]   Review of Systems  Constitutional: Positive for fatigue. Negative for appetite change.  HENT: Negative for congestion, postnasal drip and sore throat.   Eyes: Negative.   Respiratory: Positive for cough and shortness of breath. Negative for chest tightness.   Cardiovascular: Positive for leg swelling. Negative for chest pain and palpitations.  Gastrointestinal: Negative for abdominal distention and abdominal pain.  Endocrine: Negative.   Genitourinary: Negative.   Musculoskeletal: Positive for arthralgias (knee pain) and back pain.  Skin: Negative.   Allergic/Immunologic: Negative.   Neurological: Positive for light-headedness (after crying). Negative for dizziness.  Hematological: Negative for adenopathy. Does not bruise/bleed easily.  Psychiatric/Behavioral: Positive for dysphoric  mood (about father's death 4 years ago/holidays). Negative for sleep disturbance (sleeping on 1 pillow; wears oxygen at 2L ) and suicidal ideas. The patient is not nervous/anxious.    Vitals:   11/10/17 1008  BP: 110/80  Pulse: 67  Resp: 18  SpO2: 97%  Weight: (!) 457 lb 6 oz (207.5 kg)  Height: 5\' 2"  (1.575 m)   Wt Readings from Last 3 Encounters:  11/10/17 (!) 457 lb 6 oz (207.5 kg)  08/14/17 (!) 451 lb 6 oz (204.7 kg)  07/22/17 (!) 449 lb 8 oz (203.9 kg)    Lab  Results  Component Value Date   CREATININE 0.98 08/14/2017   CREATININE 1.17 (H) 07/22/2017   CREATININE 0.65 05/01/2016    Physical Exam  Constitutional: She is oriented to person, place, and time. She appears well-developed and well-nourished.  HENT:  Head: Normocephalic and atraumatic.  Neck: Normal range of motion. Neck supple. No JVD present.  Cardiovascular: Normal rate and regular rhythm.  Pulmonary/Chest: Effort normal. She has no wheezes. She has no rales.  Abdominal: Soft. She exhibits no distension. There is no tenderness.  Musculoskeletal: She exhibits edema (chronic lymphedema with L>R). She exhibits no tenderness.  Neurological: She is alert and oriented to person, place, and time.  Skin: Skin is warm and dry.  Psychiatric: Her behavior is normal. Thought content normal. She exhibits a depressed mood.  Nursing note and vitals reviewed.  Assessment & Plan:  1: Chronic heart failure with preserved ejection fraction- - NYHA class III - difficult to assess fluid status due to patient's weight - not weighing consistently and she was encouraged to weigh when the aide is there. Reminded to call for an overnight weight gain of >2 pounds or a weekly weight gain of >5 pounds - weight up 6 pounds since she was last here  - saw cardiologist (End) 07/22/17 & metolazone decreased to 2.5mg  weekly instead of twice weekly - BMP 08/14/17 reviewed and shows sodium 138, potassium 3.6, Cr 0.98 and GFR >60 - will increase her metolazone back to twice weekly  - admits to not getting any exercise due to chronic bilateral knee pain; does use a walker when she's up walking around - not adding salt to her food - drinks ~ 64 ounces of water daily  2: HTN- - BP looks good today - saw PCP (McDonald) 08/20/17 and returns 11/20/17. Patient to ask about getting a BMP drawn. Will also send a message to the office asking for this as well.  3: Obstructive sleep apnea- - wears oxygen at 2L at bedtime  and during the day as needed  4: Lymphedema- - Stage 3 lymphedema - unable to wear compression socks due to body habitus - elevates legs when she can but edema persists - has lymphapress compression boots but she says that she's unable to apply them herself and her aide has to get trained before she can apply them and this be part of her care plan  5: Depression- - patient tearful in the office regarding holidays, increased weight and death of her father 4 years ago - emotional support offered - she says that she does have social support that she can talk to about this  Medication bottles were reviewed.  Return in 3 months or sooner for any questions/problems before then.

## 2017-11-10 NOTE — Patient Instructions (Addendum)
Continue weighing daily and call for an overnight weight gain of > 2 pounds or a weekly weight gain of >5 pounds.  Increase metolazone to 2 times per week.   Ask primary care doctor to check a BMP at your next visit on 11/20/17.

## 2017-11-12 DIAGNOSIS — F329 Major depressive disorder, single episode, unspecified: Secondary | ICD-10-CM | POA: Insufficient documentation

## 2017-11-12 DIAGNOSIS — F32A Depression, unspecified: Secondary | ICD-10-CM | POA: Insufficient documentation

## 2017-12-04 ENCOUNTER — Telehealth: Payer: Self-pay

## 2017-12-04 NOTE — Telephone Encounter (Signed)
[name] would like an order to train the aid for Ms. Maj's Lymphapress boots.  Aid can not apply the boots with out training and Bailey QuinLinda can not train with out an order to do so. Also would like to know if they can increase the visits from monthly to weekly.

## 2017-12-04 NOTE — Telephone Encounter (Signed)
Returned Thais's ([facility]) message regarding increasing visits as well as training for the aid to use the lymphapress compression boots. [facility] to weekly for now and that it would be ok for the aid to be trained in how to use the boots.

## 2018-01-22 DIAGNOSIS — I10 Essential (primary) hypertension: Secondary | ICD-10-CM | POA: Diagnosis not present

## 2018-01-22 DIAGNOSIS — G4733 Obstructive sleep apnea (adult) (pediatric): Secondary | ICD-10-CM | POA: Diagnosis not present

## 2018-01-25 DIAGNOSIS — J45909 Unspecified asthma, uncomplicated: Secondary | ICD-10-CM

## 2018-01-25 HISTORY — DX: Unspecified asthma, uncomplicated: J45.909

## 2018-01-29 DIAGNOSIS — G4733 Obstructive sleep apnea (adult) (pediatric): Secondary | ICD-10-CM | POA: Diagnosis not present

## 2018-02-09 ENCOUNTER — Ambulatory Visit: Payer: Medicaid Other | Admitting: Family

## 2018-02-09 DIAGNOSIS — J449 Chronic obstructive pulmonary disease, unspecified: Secondary | ICD-10-CM | POA: Diagnosis not present

## 2018-02-09 DIAGNOSIS — R0602 Shortness of breath: Secondary | ICD-10-CM | POA: Diagnosis not present

## 2018-02-09 DIAGNOSIS — G4733 Obstructive sleep apnea (adult) (pediatric): Secondary | ICD-10-CM | POA: Diagnosis not present

## 2018-02-15 NOTE — Progress Notes (Signed)
Patient ID: [name], female    DOB: 10/11/1961, 57 y.o.   MRN: [id number]  HPI  Ms [name] is a 57 y/o female with a history of obstructive sleep apnea, morbid obesity, lymphedema, HTN, GERD, depression and chronic heart failure.  Last echo was done 05/07/16 with an EF of 60-65%. No valvular regurgitation. Technically difficult study however due to body habitus and patient was in a wheelchair.   She has not been admitted or been in the ED in the last 6 months.   She presents today for a follow-up visit with a chief complaint of moderate fatigue upon minimal exertion. She says that this has been present for many years. She has associated cough, shortness of breath, edema and chronic back pain along with this. She denies any difficulty sleeping, abdominal distention, chest pain, palpitations, dizziness or weight gain.   Past Medical History:  Diagnosis Date  . CHF (congestive heart failure) (HCC)   . Depression   . GERD (gastroesophageal reflux disease)   . Hypertension   . Lymphedema   . Morbid obesity (HCC)   . Sleep apnea    No past surgical history on file.  Family History  Problem Relation Age of Onset  . CVA Father   . Hypertension Father   . Hypertension Mother    Social History   Tobacco Use  . Smoking status: Never Smoker  . Smokeless tobacco: Never Used  Substance Use Topics  . Alcohol use: Yes    Alcohol/week: 0.0 oz    Comment: occasional alcohol use   No Known Allergies  Prior to Admission medications   Medication Sig Start Date End Date Taking? Authorizing Provider  albuterol (PROVENTIL HFA;VENTOLIN HFA) 108 (90 Base) MCG/ACT inhaler Inhale 2 puffs into the lungs every 6 (six) hours as needed for wheezing or shortness of breath.   Yes [provider]  aspirin EC 81 MG tablet Take 1 tablet (81 mg total) by mouth daily. 07/22/17  Yes End, Cristal Deerhristopher, MD  atenolol (TENORMIN) 50 MG tablet Take 1 tablet (50 mg total) by mouth 2 (two) times daily.  02/13/17  Yes Clarisa KindredHackney, Tina A, FNP  cholecalciferol (VITAMIN D) 1000 units tablet Take 2,000 Units by mouth daily.   Yes [provider]  diclofenac sodium (VOLTAREN) 1 % GEL Apply 2 g topically 2 (two) times daily as needed.   Yes [provider]  Dimethicone-Zinc Oxide (SOOTHE & COOL INZO BARRIER) 5-5 % CREA Apply 1 application topically as needed.   Yes [provider]  furosemide (LASIX) 80 MG tablet Take 1 tablet (80 mg total) by mouth daily. 02/13/17  Yes Hackney, Inetta Fermoina A, FNP  lisinopril (PRINIVIL,ZESTRIL) 40 MG tablet Take 1 tablet (40 mg total) by mouth daily. 02/13/17  Yes Clarisa KindredHackney, Tina A, FNP  metolazone (ZAROXOLYN) 2.5 MG tablet Take 1 tablet (2.5 mg total) by mouth 2 (two) times a week. 11/12/17  Yes Hackney, Inetta Fermoina A, FNP  ranitidine (ZANTAC) 150 MG tablet Take 150 mg by mouth 2 (two) times daily.   Yes [provider]  Zinc Oxide (DR SMITHS DIAPER RASH EX) Apply topically.    [provider]    Review of Systems  Constitutional: Positive for fatigue. Negative for appetite change.  HENT: Negative for congestion, postnasal drip and sore throat.   Eyes: Negative.   Respiratory: Positive for cough and shortness of breath. Negative for chest tightness.   Cardiovascular: Positive for leg swelling (minimal). Negative for chest pain and palpitations.  Gastrointestinal: Negative for abdominal distention and abdominal pain.  Endocrine: Negative.   Genitourinary: Negative.   Musculoskeletal: Positive for arthralgias (knee pain) and back pain.  Skin: Negative.   Allergic/Immunologic: Negative.   Neurological: Negative for dizziness and light-headedness.  Hematological: Negative for adenopathy. Does not bruise/bleed easily.  Psychiatric/Behavioral: Negative for dysphoric mood, sleep disturbance (sleeping on 1 pillow; wears oxygen at 2L ) and suicidal ideas. The patient is not nervous/anxious.    Vitals:   02/16/18 1026  BP: (!) 150/81  Pulse: 73   Temp: 98.3 F (36.8 C)  TempSrc: Oral  SpO2: 95%  Weight: (!) 449 lb 2 oz (203.7 kg)  Height: 5\' 2"  (1.575 m)   Wt Readings from Last 3 Encounters:  02/16/18 (!) 449 lb 2 oz (203.7 kg)  11/10/17 (!) 457 lb 6 oz (207.5 kg)  08/14/17 (!) 451 lb 6 oz (204.7 kg)   Lab Results  Component Value Date   CREATININE 0.98 08/14/2017   CREATININE 1.17 (H) 07/22/2017   CREATININE 0.65 05/01/2016    Physical Exam  Constitutional: She is oriented to person, place, and time. She appears well-developed and well-nourished.  HENT:  Head: Normocephalic and atraumatic.  Neck: Normal range of motion. Neck supple. No JVD present.  Cardiovascular: Normal rate and regular rhythm.  Pulmonary/Chest: Effort normal. She has no wheezes. She has no rales.  Abdominal: Soft. She exhibits no distension. There is no tenderness.  Musculoskeletal: She exhibits edema (chronic lymphedema although much improved). She exhibits no tenderness.  Neurological: She is alert and oriented to person, place, and time.  Skin: Skin is warm and dry.  Psychiatric: She has a normal mood and affect. Her behavior is normal. Thought content normal.  Nursing note and vitals reviewed.  Assessment & Plan:  1: Chronic heart failure with preserved ejection fraction- - NYHA class III - difficult to assess fluid status due to patient's weight - weighing daily when her aide is there. Reminded to call for an overnight weight gain of >2 pounds or a weekly weight gain of >5 pounds - weight down 8.4 pounds since she was last here 3 months ago - saw cardiologist (End) 07/22/17  - began having issues with fluid retention with weekly metolazone so now she is taking it 1-2 times/week as needed. Says she didn't take it at all last week due to having lots of things to do away from her home - admits to not getting any exercise due to chronic bilateral knee pain; does use a walker when she's up walking around - not adding salt to her food - drinks ~  64 ounces of water daily - has echo scheduled on 02/24/18 - PharmD reconciled medications with the patient  2: HTN- - BP looks good today - saw PCP Vinson Moselle) 01/25/18 - BMP 01/25/18 reviewed and shows sodium 139, potassium 4.8, Cr 0.98 and GFR >60  3: Obstructive sleep apnea- - wears oxygen at 2L at bedtime and during the day as needed - saw pulmonologist Meredeth Ide) 02/09/18 & started on albuterol inhaler  4: Lymphedema- - Stage 3 lymphedema - unable to wear compression socks due to body habitus - wearing compression boots daily now with improvement in edema  Medication bottles were reviewed.  Return in 3 months or sooner for any questions/problems before then.

## 2018-02-16 ENCOUNTER — Other Ambulatory Visit: Payer: Self-pay

## 2018-02-16 ENCOUNTER — Ambulatory Visit: Payer: Medicaid Other | Attending: Family | Admitting: Family

## 2018-02-16 ENCOUNTER — Encounter: Payer: Self-pay | Admitting: Family

## 2018-02-16 VITALS — BP 150/81 | HR 73 | Temp 98.3°F | Ht 62.0 in | Wt >= 6400 oz

## 2018-02-16 DIAGNOSIS — F329 Major depressive disorder, single episode, unspecified: Secondary | ICD-10-CM | POA: Diagnosis not present

## 2018-02-16 DIAGNOSIS — I110 Hypertensive heart disease with heart failure: Secondary | ICD-10-CM | POA: Insufficient documentation

## 2018-02-16 DIAGNOSIS — Z7982 Long term (current) use of aspirin: Secondary | ICD-10-CM | POA: Diagnosis not present

## 2018-02-16 DIAGNOSIS — Z79899 Other long term (current) drug therapy: Secondary | ICD-10-CM | POA: Diagnosis not present

## 2018-02-16 DIAGNOSIS — I5030 Unspecified diastolic (congestive) heart failure: Secondary | ICD-10-CM

## 2018-02-16 DIAGNOSIS — K219 Gastro-esophageal reflux disease without esophagitis: Secondary | ICD-10-CM | POA: Diagnosis not present

## 2018-02-16 DIAGNOSIS — I890 Lymphedema, not elsewhere classified: Secondary | ICD-10-CM | POA: Diagnosis not present

## 2018-02-16 DIAGNOSIS — G4733 Obstructive sleep apnea (adult) (pediatric): Secondary | ICD-10-CM | POA: Diagnosis not present

## 2018-02-16 DIAGNOSIS — I10 Essential (primary) hypertension: Secondary | ICD-10-CM

## 2018-02-16 DIAGNOSIS — I509 Heart failure, unspecified: Secondary | ICD-10-CM | POA: Insufficient documentation

## 2018-02-16 NOTE — Patient Instructions (Signed)
Continue weighing daily and call for an overnight weight gain of > 2 pounds or a weekly weight gain of >5 pounds. 

## 2018-02-19 DIAGNOSIS — G4733 Obstructive sleep apnea (adult) (pediatric): Secondary | ICD-10-CM | POA: Diagnosis not present

## 2018-02-19 DIAGNOSIS — I10 Essential (primary) hypertension: Secondary | ICD-10-CM | POA: Diagnosis not present

## 2018-03-01 ENCOUNTER — Other Ambulatory Visit: Payer: Self-pay | Admitting: Specialist

## 2018-03-01 DIAGNOSIS — R0602 Shortness of breath: Secondary | ICD-10-CM

## 2018-03-11 ENCOUNTER — Ambulatory Visit
Admission: RE | Admit: 2018-03-11 | Discharge: 2018-03-11 | Disposition: A | Discharge status: Home / Self Care | Payer: Medicaid Other | Source: Ambulatory Visit | Attending: Specialist | Admitting: Specialist

## 2018-03-11 DIAGNOSIS — I890 Lymphedema, not elsewhere classified: Secondary | ICD-10-CM | POA: Insufficient documentation

## 2018-03-11 DIAGNOSIS — J45909 Unspecified asthma, uncomplicated: Secondary | ICD-10-CM | POA: Diagnosis not present

## 2018-03-11 DIAGNOSIS — K219 Gastro-esophageal reflux disease without esophagitis: Secondary | ICD-10-CM | POA: Insufficient documentation

## 2018-03-11 DIAGNOSIS — I110 Hypertensive heart disease with heart failure: Secondary | ICD-10-CM | POA: Insufficient documentation

## 2018-03-11 DIAGNOSIS — R0602 Shortness of breath: Secondary | ICD-10-CM

## 2018-03-11 DIAGNOSIS — I509 Heart failure, unspecified: Secondary | ICD-10-CM | POA: Insufficient documentation

## 2018-03-11 DIAGNOSIS — G4730 Sleep apnea, unspecified: Secondary | ICD-10-CM | POA: Diagnosis not present

## 2018-03-11 DIAGNOSIS — G4733 Obstructive sleep apnea (adult) (pediatric): Secondary | ICD-10-CM | POA: Insufficient documentation

## 2018-03-11 NOTE — Progress Notes (Signed)
*  PRELIMINARY RESULTS* Echocardiogram 2D Echocardiogram has been performed.  Cristela BlueHege, Chloee Tena 03/11/2018, 10:27 AM

## 2018-03-22 DIAGNOSIS — I10 Essential (primary) hypertension: Secondary | ICD-10-CM | POA: Diagnosis not present

## 2018-03-22 DIAGNOSIS — G4733 Obstructive sleep apnea (adult) (pediatric): Secondary | ICD-10-CM | POA: Diagnosis not present

## 2018-03-24 ENCOUNTER — Ambulatory Visit: Payer: Medicaid Other | Admitting: Internal Medicine

## 2018-04-06 ENCOUNTER — Other Ambulatory Visit: Payer: Self-pay

## 2018-04-06 ENCOUNTER — Encounter: Payer: Self-pay | Admitting: Emergency Medicine

## 2018-04-06 DIAGNOSIS — I110 Hypertensive heart disease with heart failure: Secondary | ICD-10-CM | POA: Diagnosis not present

## 2018-04-06 DIAGNOSIS — J45909 Unspecified asthma, uncomplicated: Secondary | ICD-10-CM | POA: Diagnosis not present

## 2018-04-06 DIAGNOSIS — K122 Cellulitis and abscess of mouth: Secondary | ICD-10-CM | POA: Insufficient documentation

## 2018-04-06 DIAGNOSIS — T783XXA Angioneurotic edema, initial encounter: Secondary | ICD-10-CM | POA: Diagnosis not present

## 2018-04-06 DIAGNOSIS — Z87891 Personal history of nicotine dependence: Secondary | ICD-10-CM | POA: Insufficient documentation

## 2018-04-06 DIAGNOSIS — J029 Acute pharyngitis, unspecified: Secondary | ICD-10-CM | POA: Diagnosis present

## 2018-04-06 DIAGNOSIS — Z79899 Other long term (current) drug therapy: Secondary | ICD-10-CM | POA: Insufficient documentation

## 2018-04-06 DIAGNOSIS — Z7982 Long term (current) use of aspirin: Secondary | ICD-10-CM | POA: Diagnosis not present

## 2018-04-06 DIAGNOSIS — I5032 Chronic diastolic (congestive) heart failure: Secondary | ICD-10-CM | POA: Diagnosis not present

## 2018-04-06 NOTE — ED Triage Notes (Addendum)
Pt reports swelling & pain to throat since 3pm; 2 benadry taken at 830pm; denies hx of same; denies any recent illness; denies any diff breathing or swallowing; no visible swelling noted

## 2018-04-07 ENCOUNTER — Emergency Department
Admission: EM | Admit: 2018-04-07 | Discharge: 2018-04-07 | Disposition: A | Discharge status: Home / Self Care | Payer: Medicaid Other | Attending: Emergency Medicine | Admitting: Emergency Medicine

## 2018-04-07 DIAGNOSIS — K122 Cellulitis and abscess of mouth: Secondary | ICD-10-CM

## 2018-04-07 DIAGNOSIS — T783XXA Angioneurotic edema, initial encounter: Secondary | ICD-10-CM

## 2018-04-07 LAB — GROUP A STREP BY PCR: Group A Strep by PCR: NOT DETECTED

## 2018-04-07 MED ORDER — PREDNISONE 20 MG PO TABS
60.0000 mg | ORAL_TABLET | Freq: Once | ORAL | Status: AC
  Administered 2018-04-07: 60 mg via ORAL
  Filled 2018-04-07: qty 3

## 2018-04-07 MED ORDER — RACEPINEPHRINE HCL 2.25 % IN NEBU
0.5000 mL | INHALATION_SOLUTION | Freq: Once | RESPIRATORY_TRACT | Status: AC
  Administered 2018-04-07: 0.5 mL via RESPIRATORY_TRACT
  Filled 2018-04-07: qty 0.5

## 2018-04-07 MED ORDER — PREDNISONE 50 MG PO TABS: 50.0000 mg | ORAL_TABLET | Freq: Every day | ORAL | 0 refills | Status: AC

## 2018-04-07 NOTE — Discharge Instructions (Addendum)
Please take all of your steroids as prescribed and follow-up with your primary care physician tomorrow for reevaluation.  Return to the emergency department sooner for any concerns whatsoever such as if you cannot eat or drink, if you began to drool, for shortness of breath, or for any other issues whatsoever.  It was a pleasure to take care of you today, and thank you for coming to our emergency department.  If you have any questions or concerns before leaving please ask the nurse to grab me and I'm more than happy to go through your aftercare instructions again.  If you were prescribed any opioid pain medication today such as Norco, Vicodin, Percocet, morphine, hydrocodone, or oxycodone please make sure you do not drive when you are taking this medication as it can alter your ability to drive safely.  If you have any concerns once you are home that you are not improving or are in fact getting worse before you can make it to your follow-up appointment, please do not hesitate to call [phone] and come back for further evaluation.  Merrily BrittleNeil Yuvin Bussiere, MD  Results for orders placed or performed during the hospital encounter of 04/07/18  Group A Strep by PCR  Result Value Ref Range   Group A Strep by PCR NOT DETECTED NOT DETECTED

## 2018-04-07 NOTE — ED Provider Notes (Signed)
[facility] Emergency Department Provider Note  ____________________________________________   First MD Initiated Contact with Patient 04/07/18 0424     (approximate)  I have reviewed the triage vital signs and the nursing notes.   HISTORY  Chief Complaint Sore Throat   HPI [name] is a 57 y.o. female who self presents to the emergency department with itching, swelling, and pain in her throat that began at 2:30 PM today.  At 830 she took 2 Benadryl which she feels helps somewhat.  She has not tried to eat.  She feels like her uvula is "too big".  No fevers or chills.  No drooling.  No shortness of breath.  No recent illness.  Her symptoms had gradual onset and seem to be improving somewhat.  Past Medical History:  Diagnosis Date  . Asthma 01/25/2018  . CHF (congestive heart failure) (HCC)   . Depression   . GERD (gastroesophageal reflux disease)   . Hypertension   . Lymphedema   . Morbid obesity (HCC)   . Sleep apnea     Patient Active Problem List   Diagnosis Date Noted  . Depression 11/12/2017  . (HFpEF) heart failure with preserved ejection fraction (HCC) 07/22/2017  . Atypical chest pain 07/22/2017  . Chronic diastolic heart failure (HCC) 11/30/2015  . Benign essential HTN 11/30/2015  . Obstructive sleep apnea 11/30/2015  . Lymphedema of both lower extremities 11/30/2015  . Cellulitis 11/01/2015    History reviewed. No pertinent surgical history.  Prior to Admission medications   Medication Sig Start Date End Date Taking? Authorizing Provider  albuterol (PROVENTIL HFA;VENTOLIN HFA) 108 (90 Base) MCG/ACT inhaler Inhale 2 puffs into the lungs every 6 (six) hours as needed for wheezing or shortness of breath.    [provider]  aspirin EC 81 MG tablet Take 1 tablet (81 mg total) by mouth daily. 07/22/17   End, Cristal Deer, MD  atenolol (TENORMIN) 50 MG tablet Take 1 tablet (50 mg total) by mouth 2 (two) times daily. 02/13/17    Delma Freeze, FNP  cholecalciferol (VITAMIN D) 1000 units tablet Take 2,000 Units by mouth daily.    [provider]  diclofenac sodium (VOLTAREN) 1 % GEL Apply 2 g topically 2 (two) times daily as needed.    [provider]  Dimethicone-Zinc Oxide (SOOTHE & COOL INZO BARRIER) 5-5 % CREA Apply 1 application topically as needed.    [provider]  furosemide (LASIX) 80 MG tablet Take 1 tablet (80 mg total) by mouth daily. 02/13/17   Delma Freeze, FNP  lisinopril (PRINIVIL,ZESTRIL) 40 MG tablet Take 1 tablet (40 mg total) by mouth daily. 02/13/17   Delma Freeze, FNP  metolazone (ZAROXOLYN) 2.5 MG tablet Take 1 tablet (2.5 mg total) by mouth 2 (two) times a week. 11/12/17   Delma Freeze, FNP  predniSONE (DELTASONE) 50 MG tablet Take 1 tablet (50 mg total) by mouth daily for 4 days. 04/07/18 04/11/18  Merrily Brittle, MD  ranitidine (ZANTAC) 150 MG tablet Take 150 mg by mouth 2 (two) times daily.    [provider]  Zinc Oxide (DR SMITHS DIAPER RASH EX) Apply topically.    [provider]    Allergies Patient has no known allergies.  Family History  Problem Relation Age of Onset  . CVA Father   . Hypertension Father   . Hypertension Mother     Social History Social History   Tobacco Use  . Smoking status: Former  Smoker    Years: 10.00    Types: Cigarettes  . Smokeless tobacco: Never Used  . Tobacco comment: quit 6 years ago  Substance Use Topics  . Alcohol use: Yes    Alcohol/week: 0.6 oz    Types: 1 Standard drinks or equivalent per week    Comment: occasional alcohol use on weekend  . Drug use: No    Review of Systems Constitutional: No fever/chills ENT: Positive for sore throat. Cardiovascular: Denies chest pain. Respiratory: Denies shortness of breath. Gastrointestinal: No abdominal pain.  No nausea, no vomiting.  Musculoskeletal: Negative for back pain. Neurological: Negative for  headaches   ____________________________________________   PHYSICAL EXAM:  VITAL SIGNS: ED Triage Vitals  Enc Vitals Group     BP 04/06/18 2345 (!) 154/66     Pulse Rate 04/06/18 2345 61     Resp 04/06/18 2345 18     Temp 04/06/18 2345 98.1 F (36.7 C)     Temp Source 04/06/18 2345 Oral     SpO2 04/06/18 2345 97 %     Weight 04/06/18 2344 (!) 441 lb (200 kg)     Height 04/06/18 2344 5\' 1"  (1.549 m)     Head Circumference --      Peak Flow --      Pain Score 04/06/18 2343 9     Pain Loc --      Pain Edu? --      Excl. in GC? --     Constitutional: Alert and oriented x4 pleasant cooperative speaks in full clear sentences no diaphoresis Head: Atraumatic. Nose: No congestion/rhinnorhea. Mouth/Throat: No trismus uvula is midline although it is extremely large and dropping back and actually touching her tongue.  It is not deeply red but appears light pink consistent with an allergic reaction Neck: No stridor.   Cardiovascular: Regular rate and rhythm Respiratory: Normal respiratory effort.  No retractions. Neurologic:  Normal speech and language. No gross focal neurologic deficits are appreciated.      ____________________________________________  LABS (all labs ordered are listed, but only abnormal results are displayed)  Labs Reviewed  GROUP A STREP BY PCR    Lab work reviewed by me is negative for strep __________________________________________  EKG   ____________________________________________  RADIOLOGY   ____________________________________________   DIFFERENTIAL includes but not limited to  Strep pharyngitis, viral pharyngitis, uvulitis   PROCEDURES  Procedure(s) performed: no  Procedures  Critical Care performed: no  Observation: no ____________________________________________   INITIAL IMPRESSION / ASSESSMENT AND PLAN / ED COURSE  Pertinent labs & imaging results that were available during my care of the patient were reviewed by  me and considered in my medical decision making (see chart for details).  The patient arrives hemodynamically stable and well-appearing.  On exam she has clear allergic uvulitis.  Given racemic epi nebulizer with improvement in her symptoms.  I will also discharge her with a short course of steroids.  Strict return precautions have been given she verbalizes understanding and agreement with the plan.      ____________________________________________   FINAL CLINICAL IMPRESSION(S) / ED DIAGNOSES  Final diagnoses:  Angioedema, initial encounter  Uvulitis      NEW MEDICATIONS STARTED DURING THIS VISIT:  Discharge Medication List as of 04/07/2018  4:37 AM       Note:  This document was prepared using Dragon voice recognition software and may include unintentional dictation errors.      Merrily Brittleifenbark, Jeferson Boozer, MD 04/07/18 [phone]

## 2018-04-21 DIAGNOSIS — G4733 Obstructive sleep apnea (adult) (pediatric): Secondary | ICD-10-CM | POA: Diagnosis not present

## 2018-04-21 DIAGNOSIS — I10 Essential (primary) hypertension: Secondary | ICD-10-CM | POA: Diagnosis not present

## 2018-05-17 NOTE — Progress Notes (Deleted)
Patient ID: [name]. [name], female    DOB: 1960/12/26, 57 y.o.   MRN: [id number]  HPI  [name] is a 57 y/o female with a history of obstructive sleep apnea, morbid obesity, lymphedema, HTN, GERD, depression and chronic heart failure.  Echo report done 03/11/18 reviewed and showed an EF of 55-60%. Echo done 05/07/16 showed an EF of 60-65%. No valvular regurgitation. Technically difficult study however due to body habitus and patient was in a wheelchair.   Was in the ED 04/07/18 due to angioedema.She was treated and discharged with a short course of steroids.    She presents today for a follow-up visit with a chief complaint of   Past Medical History:  Diagnosis Date  . Asthma 01/25/2018  . CHF (congestive heart failure) (HCC)   . Depression   . GERD (gastroesophageal reflux disease)   . Hypertension   . Lymphedema   . Morbid obesity (HCC)   . Sleep apnea    No past surgical history on file.  Family History  Problem Relation Age of Onset  . CVA Father   . Hypertension Father   . Hypertension Mother    Social History   Tobacco Use  . Smoking status: Former Smoker    Years: 10.00    Types: Cigarettes  . Smokeless tobacco: Never Used  . Tobacco comment: quit 6 years ago  Substance Use Topics  . Alcohol use: Yes    Alcohol/week: 0.6 oz    Types: 1 Standard drinks or equivalent per week    Comment: occasional alcohol use on weekend   No Known Allergies    Review of Systems  Constitutional: Positive for fatigue. Negative for appetite change.  HENT: Negative for congestion, postnasal drip and sore throat.   Eyes: Negative.   Respiratory: Positive for cough and shortness of breath. Negative for chest tightness.   Cardiovascular: Positive for leg swelling (minimal). Negative for chest pain and palpitations.  Gastrointestinal: Negative for abdominal distention and abdominal pain.  Endocrine: Negative.   Genitourinary: Negative.   Musculoskeletal: Positive for arthralgias  (knee pain) and back pain.  Skin: Negative.   Allergic/Immunologic: Negative.   Neurological: Negative for dizziness and light-headedness.  Hematological: Negative for adenopathy. Does not bruise/bleed easily.  Psychiatric/Behavioral: Negative for dysphoric mood, sleep disturbance (sleeping on 1 pillow; wears oxygen at 2L ) and suicidal ideas. The patient is not nervous/anxious.      Physical Exam  Constitutional: She is oriented to person, place, and time. She appears well-developed and well-nourished.  HENT:  Head: Normocephalic and atraumatic.  Neck: Normal range of motion. Neck supple. No JVD present.  Cardiovascular: Normal rate and regular rhythm.  Pulmonary/Chest: Effort normal. She has no wheezes. She has no rales.  Abdominal: Soft. She exhibits no distension. There is no tenderness.  Musculoskeletal: She exhibits edema (chronic lymphedema although much improved). She exhibits no tenderness.  Neurological: She is alert and oriented to person, place, and time.  Skin: Skin is warm and dry.  Psychiatric: She has a normal mood and affect. Her behavior is normal. Thought content normal.  Nursing note and vitals reviewed.  Assessment & Plan:  1: Chronic heart failure with preserved ejection fraction- - NYHA class III - difficult to assess fluid status due to patient's weight - weighing daily when her aide is there. Reminded to call for an overnight weight gain of >2 pounds or a weekly weight gain of >5 pounds - weight down 8.4 pounds since she was last here  3 months ago - saw cardiologist (End) 07/22/17  - began having issues with fluid retention with weekly metolazone so now she is taking it 1-2 times/week as needed. Says she didn't take it at all last week due to having lots of things to do away from her home - admits to not getting any exercise due to chronic bilateral knee pain; does use a walker when she's up walking around - not adding salt to her food - drinks ~ 64 ounces of  water daily - has echo scheduled on 02/24/18 - PharmD reconciled medications with the patient  2: HTN- - BP looks good today - saw PCP Vinson Moselle) 01/25/18 - BMP 01/25/18 reviewed and shows sodium 139, potassium 4.8, Cr 0.98 and GFR >60  3: Obstructive sleep apnea- - wears oxygen at 2L at bedtime and during the day as needed - saw pulmonologist Meredeth Ide) 03/30/18  4: Lymphedema- - Stage 3 lymphedema - unable to wear compression socks due to body habitus - wearing compression boots daily now with improvement in edema  Medication bottles were reviewed.

## 2018-05-18 ENCOUNTER — Ambulatory Visit: Payer: Medicaid Other | Admitting: Family

## 2018-05-22 DIAGNOSIS — G4733 Obstructive sleep apnea (adult) (pediatric): Secondary | ICD-10-CM | POA: Diagnosis not present

## 2018-05-22 DIAGNOSIS — I10 Essential (primary) hypertension: Secondary | ICD-10-CM | POA: Diagnosis not present

## 2018-05-25 ENCOUNTER — Encounter: Payer: Self-pay | Admitting: Family

## 2018-05-25 ENCOUNTER — Ambulatory Visit: Payer: Medicaid Other | Attending: Family | Admitting: Family

## 2018-05-25 VITALS — BP 112/72 | HR 74 | Resp 18 | Ht 62.0 in | Wt >= 6400 oz

## 2018-05-25 DIAGNOSIS — G4733 Obstructive sleep apnea (adult) (pediatric): Secondary | ICD-10-CM | POA: Diagnosis not present

## 2018-05-25 DIAGNOSIS — I890 Lymphedema, not elsewhere classified: Secondary | ICD-10-CM | POA: Diagnosis not present

## 2018-05-25 DIAGNOSIS — F329 Major depressive disorder, single episode, unspecified: Secondary | ICD-10-CM | POA: Diagnosis not present

## 2018-05-25 DIAGNOSIS — J45909 Unspecified asthma, uncomplicated: Secondary | ICD-10-CM | POA: Diagnosis not present

## 2018-05-25 DIAGNOSIS — Z79899 Other long term (current) drug therapy: Secondary | ICD-10-CM | POA: Diagnosis not present

## 2018-05-25 DIAGNOSIS — M25562 Pain in left knee: Secondary | ICD-10-CM | POA: Diagnosis not present

## 2018-05-25 DIAGNOSIS — G8929 Other chronic pain: Secondary | ICD-10-CM | POA: Insufficient documentation

## 2018-05-25 DIAGNOSIS — Z87891 Personal history of nicotine dependence: Secondary | ICD-10-CM | POA: Diagnosis not present

## 2018-05-25 DIAGNOSIS — M25561 Pain in right knee: Secondary | ICD-10-CM | POA: Insufficient documentation

## 2018-05-25 DIAGNOSIS — I10 Essential (primary) hypertension: Secondary | ICD-10-CM

## 2018-05-25 DIAGNOSIS — Z7982 Long term (current) use of aspirin: Secondary | ICD-10-CM | POA: Diagnosis not present

## 2018-05-25 DIAGNOSIS — K219 Gastro-esophageal reflux disease without esophagitis: Secondary | ICD-10-CM | POA: Diagnosis not present

## 2018-05-25 DIAGNOSIS — Z8249 Family history of ischemic heart disease and other diseases of the circulatory system: Secondary | ICD-10-CM | POA: Insufficient documentation

## 2018-05-25 DIAGNOSIS — I110 Hypertensive heart disease with heart failure: Secondary | ICD-10-CM | POA: Insufficient documentation

## 2018-05-25 DIAGNOSIS — Z823 Family history of stroke: Secondary | ICD-10-CM | POA: Diagnosis not present

## 2018-05-25 DIAGNOSIS — I5032 Chronic diastolic (congestive) heart failure: Secondary | ICD-10-CM | POA: Diagnosis not present

## 2018-05-25 MED ORDER — METOLAZONE 2.5 MG PO TABS: 2.5000 mg | ORAL_TABLET | ORAL | 5 refills | Status: DC

## 2018-05-25 NOTE — Patient Instructions (Signed)
Continue weighing daily and call for an overnight weight gain of > 2 pounds or a weekly weight gain of >5 pounds. 

## 2018-05-25 NOTE — Progress Notes (Signed)
Patient ID: [name], female    DOB: 01/18/1961, 57 y.o.   MRN: [id number]  HPI  Ms [name] is a 57 y/o female with a history of obstructive sleep apnea, morbid obesity, lymphedema, HTN, GERD, depression and chronic heart failure.  Echo report from 03/11/18 reviewed and showed an EF of 55-60%. Echo report done 03/11/18 reviewed and showed an EF of 55-60%. Echo done 05/07/16 showed an EF of 60-65%. No valvular regurgitation. Technically difficult study however due to body habitus and patient was in a wheelchair.   Was in the ED 04/07/18 due to angioedema.She was treated and discharged with a short course of steroids.    She presents today for a follow-up visit with a chief complaint of moderate fatigue upon minimal exertion. She says that this has been present for several years with varying levels of severity. She has associated cough, shortness of breath, wheezing, chronic lymphedema and back pain along with this. She denies any difficulty sleeping, abdominal distention, palpitations, chest pain, dizziness or weight gain. She has been out of metolazone for the last month and has noticed that her home weight is gradually rising.   Past Medical History:  Diagnosis Date  . Asthma 01/25/2018  . CHF (congestive heart failure) (HCC)   . Depression   . GERD (gastroesophageal reflux disease)   . Hypertension   . Lymphedema   . Morbid obesity (HCC)   . Sleep apnea    No past surgical history on file.  Family History  Problem Relation Age of Onset  . CVA Father   . Hypertension Father   . Hypertension Mother    Social History   Tobacco Use  . Smoking status: Former Smoker    Years: 10.00    Types: Cigarettes  . Smokeless tobacco: Never Used  . Tobacco comment: quit 6 years ago  Substance Use Topics  . Alcohol use: Yes    Alcohol/week: 0.6 oz    Types: 1 Standard drinks or equivalent per week    Comment: occasional alcohol use on weekend   No Known Allergies  Prior to Admission  medications   Medication Sig Start Date End Date Taking? Authorizing Provider  albuterol (PROVENTIL HFA;VENTOLIN HFA) 108 (90 Base) MCG/ACT inhaler Inhale 2 puffs into the lungs every 6 (six) hours as needed for wheezing or shortness of breath.   Yes [provider]  aspirin EC 81 MG tablet Take 1 tablet (81 mg total) by mouth daily. 07/22/17  Yes End, Cristal Deerhristopher, MD  atenolol (TENORMIN) 50 MG tablet Take 1 tablet (50 mg total) by mouth 2 (two) times daily. 02/13/17  Yes Clarisa KindredHackney, Anju Sereno A, FNP  cholecalciferol (VITAMIN D) 1000 units tablet Take 2,000 Units by mouth daily.   Yes [provider]  diclofenac sodium (VOLTAREN) 1 % GEL Apply 2 g topically 2 (two) times daily as needed.   Yes [provider]  Dimethicone-Zinc Oxide (SOOTHE & COOL INZO BARRIER) 5-5 % CREA Apply 1 application topically as needed.   Yes [provider]  furosemide (LASIX) 80 MG tablet Take 1 tablet (80 mg total) by mouth daily. 02/13/17  Yes Hrishikesh Hoeg, Inetta Fermoina A, FNP  lisinopril (PRINIVIL,ZESTRIL) 40 MG tablet Take 1 tablet (40 mg total) by mouth daily. 02/13/17  Yes Clarisa KindredHackney, Miriya Cloer A, FNP  ranitidine (ZANTAC) 150 MG tablet Take 150 mg by mouth 2 (two) times daily.   Yes [provider]  Zinc Oxide (DR SMITHS DIAPER RASH EX) Apply topically.   Yes [provider]  metolazone (ZAROXOLYN) 2.5 MG tablet Take 1 tablet (2.5 mg total) by mouth 2 (two) times a week. 05/27/18   Delma Freeze, FNP    Review of Systems  Constitutional: Positive for fatigue. Negative for appetite change.  HENT: Negative for congestion, postnasal drip and sore throat.   Eyes: Negative.   Respiratory: Positive for cough, shortness of breath and wheezing (at times). Negative for chest tightness.   Cardiovascular: Positive for leg swelling (minimal). Negative for chest pain and palpitations.  Gastrointestinal: Negative for abdominal distention and abdominal pain.  Endocrine: Negative.   Genitourinary:  Negative.   Musculoskeletal: Positive for arthralgias (knee pain) and back pain.  Skin: Negative.   Allergic/Immunologic: Negative.   Neurological: Negative for dizziness and light-headedness.  Hematological: Negative for adenopathy. Does not bruise/bleed easily.  Psychiatric/Behavioral: Negative for dysphoric mood, sleep disturbance (sleeping on 1 pillow; wears CPAP at bedtime ) and suicidal ideas. The patient is not nervous/anxious.    Vitals:   05/25/18 0928  BP: 112/72  Pulse: 74  Resp: 18  SpO2: 99%  Weight: (!) 449 lb 2 oz (203.7 kg)  Height: 5\' 2"  (1.575 m)   Wt Readings from Last 3 Encounters:  05/25/18 (!) 449 lb 2 oz (203.7 kg)  04/06/18 (!) 441 lb (200 kg)  02/16/18 (!) 449 lb 2 oz (203.7 kg)   Lab Results  Component Value Date   CREATININE 0.98 08/14/2017   CREATININE 1.17 (H) 07/22/2017   CREATININE 0.65 05/01/2016    Physical Exam  Constitutional: She is oriented to person, place, and time. She appears well-developed and well-nourished.  HENT:  Head: Normocephalic and atraumatic.  Neck: Normal range of motion. Neck supple. No JVD present.  Cardiovascular: Normal rate and regular rhythm.  Pulmonary/Chest: Effort normal. She has no wheezes. She has no rales.  Abdominal: Soft. She exhibits no distension. There is no tenderness.  Musculoskeletal: She exhibits edema (chronic lymphedema although much improved). She exhibits no tenderness.  Neurological: She is alert and oriented to person, place, and time.  Skin: Skin is warm and dry.  Psychiatric: She has a normal mood and affect. Her behavior is normal. Thought content normal.  Nursing note and vitals reviewed.  Assessment & Plan:  1: Chronic heart failure with preserved ejection fraction- - NYHA class III - difficult to assess fluid status due to patient's weight - weighing daily when her aide is there. Reminded to call for an overnight weight gain of >2 pounds or a weekly weight gain of >5 pounds - weight  stable since she was last here - saw cardiologist (End) 07/22/17 & returns July 2019 - will refill her metolazone twice weekly - admits to not getting any exercise due to chronic bilateral knee pain; does use a walker when she's up walking around - not adding salt to her food - drinks ~ 64 ounces of water daily - PharmD reconciled medications with the patient  2: HTN- - BP looks good today - saw PCP Vinson Moselle) 04/27/18 - BMP 01/25/18 reviewed and shows sodium 139, potassium 4.8, Cr 0.98 and GFR >60  3: Obstructive sleep apnea- - wears oxygen at 2L during the day as needed - wearing CPAP at bedtime - saw pulmonologist Meredeth Ide) 03/30/18  4: Lymphedema- - Stage 3 lymphedema - unable to wear compression socks due to body habitus - wearing compression boots daily when her home aide comes  Medication bottles were reviewed.  Return in 6 months or sooner for any questions/problems before then.

## 2018-06-20 DIAGNOSIS — G8929 Other chronic pain: Secondary | ICD-10-CM | POA: Diagnosis not present

## 2018-06-21 DIAGNOSIS — G8929 Other chronic pain: Secondary | ICD-10-CM | POA: Diagnosis not present

## 2018-06-21 DIAGNOSIS — I5020 Unspecified systolic (congestive) heart failure: Secondary | ICD-10-CM | POA: Diagnosis not present

## 2018-06-21 DIAGNOSIS — N390 Urinary tract infection, site not specified: Secondary | ICD-10-CM | POA: Diagnosis not present

## 2018-06-21 DIAGNOSIS — S80921A Unspecified superficial injury of right lower leg, initial encounter: Secondary | ICD-10-CM | POA: Diagnosis not present

## 2018-06-21 DIAGNOSIS — I890 Lymphedema, not elsewhere classified: Secondary | ICD-10-CM | POA: Diagnosis not present

## 2018-06-21 DIAGNOSIS — F329 Major depressive disorder, single episode, unspecified: Secondary | ICD-10-CM | POA: Diagnosis not present

## 2018-06-21 DIAGNOSIS — M6281 Muscle weakness (generalized): Secondary | ICD-10-CM | POA: Diagnosis not present

## 2018-06-21 DIAGNOSIS — R0602 Shortness of breath: Secondary | ICD-10-CM | POA: Diagnosis not present

## 2018-06-21 DIAGNOSIS — K219 Gastro-esophageal reflux disease without esophagitis: Secondary | ICD-10-CM | POA: Diagnosis not present

## 2018-06-21 DIAGNOSIS — R32 Unspecified urinary incontinence: Secondary | ICD-10-CM | POA: Diagnosis not present

## 2018-06-21 DIAGNOSIS — G4730 Sleep apnea, unspecified: Secondary | ICD-10-CM | POA: Diagnosis not present

## 2018-06-21 DIAGNOSIS — Z9981 Dependence on supplemental oxygen: Secondary | ICD-10-CM | POA: Diagnosis not present

## 2018-06-21 DIAGNOSIS — I10 Essential (primary) hypertension: Secondary | ICD-10-CM | POA: Diagnosis not present

## 2018-06-21 DIAGNOSIS — G4733 Obstructive sleep apnea (adult) (pediatric): Secondary | ICD-10-CM | POA: Diagnosis not present

## 2018-06-21 DIAGNOSIS — I5031 Acute diastolic (congestive) heart failure: Secondary | ICD-10-CM | POA: Diagnosis not present

## 2018-06-22 DIAGNOSIS — I10 Essential (primary) hypertension: Secondary | ICD-10-CM | POA: Diagnosis not present

## 2018-06-22 DIAGNOSIS — I890 Lymphedema, not elsewhere classified: Secondary | ICD-10-CM | POA: Diagnosis not present

## 2018-06-22 DIAGNOSIS — I5031 Acute diastolic (congestive) heart failure: Secondary | ICD-10-CM | POA: Diagnosis not present

## 2018-06-22 DIAGNOSIS — S80921A Unspecified superficial injury of right lower leg, initial encounter: Secondary | ICD-10-CM | POA: Diagnosis not present

## 2018-06-22 DIAGNOSIS — Z9981 Dependence on supplemental oxygen: Secondary | ICD-10-CM | POA: Diagnosis not present

## 2018-06-22 DIAGNOSIS — G8929 Other chronic pain: Secondary | ICD-10-CM | POA: Diagnosis not present

## 2018-06-22 DIAGNOSIS — G4730 Sleep apnea, unspecified: Secondary | ICD-10-CM | POA: Diagnosis not present

## 2018-06-22 DIAGNOSIS — F329 Major depressive disorder, single episode, unspecified: Secondary | ICD-10-CM | POA: Diagnosis not present

## 2018-06-22 DIAGNOSIS — K219 Gastro-esophageal reflux disease without esophagitis: Secondary | ICD-10-CM | POA: Diagnosis not present

## 2018-06-22 DIAGNOSIS — N390 Urinary tract infection, site not specified: Secondary | ICD-10-CM | POA: Diagnosis not present

## 2018-06-22 DIAGNOSIS — R32 Unspecified urinary incontinence: Secondary | ICD-10-CM | POA: Diagnosis not present

## 2018-06-22 NOTE — Progress Notes (Signed)
Follow-up Outpatient Visit Date: 06/23/2018  Primary Care Provider: [facility] [address] [facility] [place] [address]  Chief Complaint: Follow-up heart failure  HPI:  [name] is a 57 y.o. year-old female with history of heart failure with preserved ejection fraction, hypertension, morbid obesity, obstructive sleep apnea, lymphedema, GERD, and depression, who presents for follow-up of heart failure.  I last saw her in 07/2018, at which time her edema was well-controlled and her exertional dyspnea improving.  She also reported atypical chest pain; ischemia evaluation was deferred given nature of the pain and challenges with ischemia evaluaiton in the setting of her BMI>80.  She was seen in the ED in April for possible angioedema.  No medication changes were made at that time; the patient remains on lisinopril.  She has not had any further throat swelling, though she has experienced an intermittent nonproductive cough.  Ms. Maybee otherwise reports feeling well.  She feels like she has put on a little bit of weight with mild swelling over the last couple of days that she attributes to dietary indiscretion.  She has been under increased stress recently following the death of her ex-husband.  She remains compliant with her medications including furosemide and metolazone.  She has stable exertional dyspnea.  She denies chest pain and palpitations.  She notes occasional brief orthostatic lightheadedness.  She has not fallen.  She is compliant with CPAP at night.  Recent labs through PCP at [facility] showed stable renal function.  --------------------------------------------------------------------------------------------------  Cardiovascular History & Procedures: Cardiovascular Problems:  Heart failure with preserved ejection fraction  Risk Factors:  Hypertension, obesity, and sedentary lifestyle  Cath/PCI:  None  CV Surgery:  None  EP Procedures and Devices:  None  Non-Invasive  Evaluation(s):  TTE (03/11/18): Technically challenging due to body habitus. Normal LV size with moderate LVH.  LVEF 55-60%.    TTE (05/07/16): Normal LV size with LVEF of 60-65%. Normal RV size and function. Challenging study due to body habitus and patient in wheelchair.   Recent CV Pertinent Labs: Lab Results  Component Value Date   CHOL 151 07/22/2017   HDL 52 07/22/2017   LDLCALC 78 07/22/2017   TRIG 106 07/22/2017   CHOLHDL 2.9 07/22/2017   BNP 254 (H) 02/12/2013   K 3.6 08/14/2017   K 3.6 02/13/2013   BUN 25 (H) 08/14/2017   BUN 9 02/13/2013   CREATININE 0.98 08/14/2017   CREATININE 0.93 02/17/2013    Past medical and surgical history were reviewed and updated in EPIC.  Current Meds  Medication Sig  . albuterol (PROVENTIL HFA;VENTOLIN HFA) 108 (90 Base) MCG/ACT inhaler Inhale 2 puffs into the lungs every 6 (six) hours as needed for wheezing or shortness of breath.  Marland Kitchen aspirin EC 81 MG tablet Take 1 tablet (81 mg total) by mouth daily.  Marland Kitchen atenolol (TENORMIN) 50 MG tablet Take 1 tablet (50 mg total) by mouth 2 (two) times daily.  . cholecalciferol (VITAMIN D) 1000 units tablet Take 2,000 Units by mouth daily.  . diclofenac sodium (VOLTAREN) 1 % GEL Apply 2 g topically 2 (two) times daily as needed.  . Dimethicone-Zinc Oxide (SOOTHE & COOL INZO BARRIER) 5-5 % CREA Apply 1 application topically as needed.  . furosemide (LASIX) 80 MG tablet Take 1 tablet (80 mg total) by mouth daily.  Marland Kitchen lisinopril (PRINIVIL,ZESTRIL) 40 MG tablet Take 1 tablet (40 mg total) by mouth daily.  . metolazone (ZAROXOLYN) 2.5 MG tablet Take 1 tablet (2.5 mg total) by  mouth 2 (two) times a week.  . ranitidine (ZANTAC) 150 MG tablet Take 150 mg by mouth 2 (two) times daily.  . Zinc Oxide (DR SMITHS DIAPER RASH EX) Apply topically.    Allergies: Patient has no known allergies.  Social History   Tobacco Use  . Smoking status: Former Smoker    Years: 10.00    Types: Cigarettes  . Smokeless tobacco:  Never Used  . Tobacco comment: quit 6 years ago  Substance Use Topics  . Alcohol use: Yes    Alcohol/week: 0.6 oz    Types: 1 Standard drinks or equivalent per week    Comment: occasional alcohol use on weekend  . Drug use: No    Family History  Problem Relation Age of Onset  . CVA Father   . Hypertension Father   . Hypertension Mother     Review of Systems: A 12-system review of systems was performed and was negative except as noted in the HPI.  --------------------------------------------------------------------------------------------------  Physical Exam: BP 138/84 (BP Location: Right Wrist, Patient Position: Sitting, Cuff Size: Normal)   Pulse 67   Ht 5' 1.5" (1.562 m)   Wt (!) 454 lb (205.9 kg)   BMI 84.39 kg/m   General: NAD. HEENT: No conjunctival pallor or scleral icterus. Moist mucous membranes.  OP clear. Neck: Supple without lymphadenopathy, thyromegaly, JVD, or HJR, though evaluation is limited by body habitus. Lungs: Normal work of breathing. Clear to auscultation bilaterally without wheezes or crackles. Heart: Distant heart sounds.  Regular rate and rhythm without murmurs or rubs.  Unable to assess PMI due to body habitus. Abd: Bowel sounds present. Soft, NT/ND.  Unable to assess HSM due to body habitus. Ext: Mild chronic lower extremity edema without pitting. Skin: Warm and dry without rash.  EKG: Normal sinus rhythm without significant abnormalities.  Lab Results  Component Value Date   WBC 6.7 07/22/2017   HGB 12.5 07/22/2017   HCT 36.6 07/22/2017   MCV 100.4 (H) 07/22/2017   PLT 191 07/22/2017    Lab Results  Component Value Date   NA 138 08/14/2017   K 3.6 08/14/2017   CL 92 (L) 08/14/2017   CO2 34 (H) 08/14/2017   BUN 25 (H) 08/14/2017   CREATININE 0.98 08/14/2017   GLUCOSE 96 08/14/2017   ALT 11 (L) 07/22/2017    Lab Results  Component Value Date   CHOL 151 07/22/2017   HDL 52 07/22/2017   LDLCALC 78 07/22/2017   TRIG 106  07/22/2017   CHOLHDL 2.9 07/22/2017    --------------------------------------------------------------------------------------------------  ASSESSMENT AND PLAN: HFpEF Assessment of volume status is challenging due to morbid obesity.  Ms. Schoenfelder's weight is up 5 pounds compared with a month ago.  Some of this may be due to recent dietary indiscretions.  We will continue furosemide 80 mg daily and metolazone 2.5 mg twice a week.  I advised [name] to take an extra dose of furosemide if she gains more than 2 pounds in a day, 5 pounds in a week, or has appreciable worsening of her edema.  In light of possible angioedema and intermittent cough, we will transition from lisinopril to losartan 100 mg daily.  Hypertension Blood pressure upper normal today.  We will transition from lisinopril to losartan 100 mg daily.  [facility] normal creatinine and potassium.  I will have Ms. Sholl return for a BMP in about 2 weeks.  We will continue with atenolol 50 mg twice daily.  Atypical chest  pain No recurrence since our last visit.  No further cardiac work-up at this time.  Follow-up: Return to clinic in 3 months.  Yvonne Kendall, MD 06/23/2018 10:27 AM

## 2018-06-23 ENCOUNTER — Encounter: Payer: Self-pay | Admitting: Internal Medicine

## 2018-06-23 ENCOUNTER — Ambulatory Visit: Payer: Medicaid Other | Admitting: Internal Medicine

## 2018-06-23 VITALS — BP 138/84 | HR 67 | Ht 61.5 in | Wt >= 6400 oz

## 2018-06-23 DIAGNOSIS — R0789 Other chest pain: Secondary | ICD-10-CM

## 2018-06-23 DIAGNOSIS — I5032 Chronic diastolic (congestive) heart failure: Secondary | ICD-10-CM | POA: Diagnosis not present

## 2018-06-23 DIAGNOSIS — I10 Essential (primary) hypertension: Secondary | ICD-10-CM

## 2018-06-23 DIAGNOSIS — G8929 Other chronic pain: Secondary | ICD-10-CM | POA: Diagnosis not present

## 2018-06-23 MED ORDER — LOSARTAN POTASSIUM 100 MG PO TABS: 100.0000 mg | ORAL_TABLET | Freq: Every day | ORAL | 1 refills | Status: DC

## 2018-06-23 NOTE — Patient Instructions (Signed)
Medication Instructions: STOP the Lisinopril START Losartan 100 mg daily  If you need a refill on your cardiac medications before your next appointment, please call your pharmacy.   Labwork: Your provider would like for you to return in 2 weeks to have the following labs drawn: BMET. Please go to the [facility] entrance and check in at the front desk. You do not need an appointment.   Follow-Up: Your physician wants you to follow-up in 3 months with an APP.   Thank you for choosing [facility]!

## 2018-06-24 DIAGNOSIS — G8929 Other chronic pain: Secondary | ICD-10-CM | POA: Diagnosis not present

## 2018-06-25 DIAGNOSIS — G8929 Other chronic pain: Secondary | ICD-10-CM | POA: Diagnosis not present

## 2018-06-26 DIAGNOSIS — G8929 Other chronic pain: Secondary | ICD-10-CM | POA: Diagnosis not present

## 2018-06-27 DIAGNOSIS — G8929 Other chronic pain: Secondary | ICD-10-CM | POA: Diagnosis not present

## 2018-06-28 DIAGNOSIS — G8929 Other chronic pain: Secondary | ICD-10-CM | POA: Diagnosis not present

## 2018-06-29 DIAGNOSIS — G8929 Other chronic pain: Secondary | ICD-10-CM | POA: Diagnosis not present

## 2018-06-30 DIAGNOSIS — G8929 Other chronic pain: Secondary | ICD-10-CM | POA: Diagnosis not present

## 2018-07-01 DIAGNOSIS — G8929 Other chronic pain: Secondary | ICD-10-CM | POA: Diagnosis not present

## 2018-07-02 DIAGNOSIS — G8929 Other chronic pain: Secondary | ICD-10-CM | POA: Diagnosis not present

## 2018-07-03 DIAGNOSIS — G8929 Other chronic pain: Secondary | ICD-10-CM | POA: Diagnosis not present

## 2018-07-05 DIAGNOSIS — G8929 Other chronic pain: Secondary | ICD-10-CM | POA: Diagnosis not present

## 2018-07-06 ENCOUNTER — Other Ambulatory Visit
Admission: RE | Admit: 2018-07-06 | Discharge: 2018-07-06 | Disposition: A | Discharge status: Home / Self Care | Payer: Medicaid Other | Source: Ambulatory Visit | Attending: Internal Medicine | Admitting: Internal Medicine

## 2018-07-06 DIAGNOSIS — I5032 Chronic diastolic (congestive) heart failure: Secondary | ICD-10-CM | POA: Diagnosis not present

## 2018-07-06 DIAGNOSIS — G8929 Other chronic pain: Secondary | ICD-10-CM | POA: Diagnosis not present

## 2018-07-06 DIAGNOSIS — I10 Essential (primary) hypertension: Secondary | ICD-10-CM | POA: Diagnosis not present

## 2018-07-06 LAB — BASIC METABOLIC PANEL
Anion gap: 8 (ref 5–15)
BUN: 27 mg/dL — AB (ref 6–20)
CO2: 35 mmol/L — ABNORMAL HIGH (ref 22–32)
CREATININE: 0.98 mg/dL (ref 0.44–1.00)
Calcium: 9.4 mg/dL (ref 8.9–10.3)
Chloride: 98 mmol/L (ref 98–111)
GFR calc Af Amer: >60 mL/min (ref >60)
GLUCOSE: 110 mg/dL — AB (ref 70–99)
POTASSIUM: 4.1 mmol/L (ref 3.5–5.1)
SODIUM: 141 mmol/L (ref 135–145)

## 2018-07-07 DIAGNOSIS — G8929 Other chronic pain: Secondary | ICD-10-CM | POA: Diagnosis not present

## 2018-07-08 DIAGNOSIS — G8929 Other chronic pain: Secondary | ICD-10-CM | POA: Diagnosis not present

## 2018-07-09 DIAGNOSIS — G8929 Other chronic pain: Secondary | ICD-10-CM | POA: Diagnosis not present

## 2018-07-10 DIAGNOSIS — G8929 Other chronic pain: Secondary | ICD-10-CM | POA: Diagnosis not present

## 2018-07-11 DIAGNOSIS — G8929 Other chronic pain: Secondary | ICD-10-CM | POA: Diagnosis not present

## 2018-07-12 DIAGNOSIS — G8929 Other chronic pain: Secondary | ICD-10-CM | POA: Diagnosis not present

## 2018-07-13 DIAGNOSIS — G8929 Other chronic pain: Secondary | ICD-10-CM | POA: Diagnosis not present

## 2018-07-14 DIAGNOSIS — G8929 Other chronic pain: Secondary | ICD-10-CM | POA: Diagnosis not present

## 2018-07-15 DIAGNOSIS — G8929 Other chronic pain: Secondary | ICD-10-CM | POA: Diagnosis not present

## 2018-07-16 DIAGNOSIS — G8929 Other chronic pain: Secondary | ICD-10-CM | POA: Diagnosis not present

## 2018-07-17 DIAGNOSIS — G8929 Other chronic pain: Secondary | ICD-10-CM | POA: Diagnosis not present

## 2018-07-19 DIAGNOSIS — G8929 Other chronic pain: Secondary | ICD-10-CM | POA: Diagnosis not present

## 2018-07-20 DIAGNOSIS — N390 Urinary tract infection, site not specified: Secondary | ICD-10-CM | POA: Diagnosis not present

## 2018-07-20 DIAGNOSIS — Z9981 Dependence on supplemental oxygen: Secondary | ICD-10-CM | POA: Diagnosis not present

## 2018-07-20 DIAGNOSIS — I10 Essential (primary) hypertension: Secondary | ICD-10-CM | POA: Diagnosis not present

## 2018-07-20 DIAGNOSIS — S80921A Unspecified superficial injury of right lower leg, initial encounter: Secondary | ICD-10-CM | POA: Diagnosis not present

## 2018-07-20 DIAGNOSIS — I890 Lymphedema, not elsewhere classified: Secondary | ICD-10-CM | POA: Diagnosis not present

## 2018-07-20 DIAGNOSIS — G4730 Sleep apnea, unspecified: Secondary | ICD-10-CM | POA: Diagnosis not present

## 2018-07-20 DIAGNOSIS — K219 Gastro-esophageal reflux disease without esophagitis: Secondary | ICD-10-CM | POA: Diagnosis not present

## 2018-07-20 DIAGNOSIS — G8929 Other chronic pain: Secondary | ICD-10-CM | POA: Diagnosis not present

## 2018-07-20 DIAGNOSIS — I5031 Acute diastolic (congestive) heart failure: Secondary | ICD-10-CM | POA: Diagnosis not present

## 2018-07-20 DIAGNOSIS — F329 Major depressive disorder, single episode, unspecified: Secondary | ICD-10-CM | POA: Diagnosis not present

## 2018-07-20 DIAGNOSIS — R32 Unspecified urinary incontinence: Secondary | ICD-10-CM | POA: Diagnosis not present

## 2018-07-21 DIAGNOSIS — G8929 Other chronic pain: Secondary | ICD-10-CM | POA: Diagnosis not present

## 2018-07-22 DIAGNOSIS — M6281 Muscle weakness (generalized): Secondary | ICD-10-CM | POA: Diagnosis not present

## 2018-07-22 DIAGNOSIS — G4733 Obstructive sleep apnea (adult) (pediatric): Secondary | ICD-10-CM | POA: Diagnosis not present

## 2018-07-22 DIAGNOSIS — G8929 Other chronic pain: Secondary | ICD-10-CM | POA: Diagnosis not present

## 2018-07-22 DIAGNOSIS — I10 Essential (primary) hypertension: Secondary | ICD-10-CM | POA: Diagnosis not present

## 2018-07-22 DIAGNOSIS — I5020 Unspecified systolic (congestive) heart failure: Secondary | ICD-10-CM | POA: Diagnosis not present

## 2018-07-22 DIAGNOSIS — R0602 Shortness of breath: Secondary | ICD-10-CM | POA: Diagnosis not present

## 2018-07-23 DIAGNOSIS — G8929 Other chronic pain: Secondary | ICD-10-CM | POA: Diagnosis not present

## 2018-07-24 DIAGNOSIS — G8929 Other chronic pain: Secondary | ICD-10-CM | POA: Diagnosis not present

## 2018-07-25 DIAGNOSIS — G8929 Other chronic pain: Secondary | ICD-10-CM | POA: Diagnosis not present

## 2018-07-26 DIAGNOSIS — G8929 Other chronic pain: Secondary | ICD-10-CM | POA: Diagnosis not present

## 2018-07-27 DIAGNOSIS — G8929 Other chronic pain: Secondary | ICD-10-CM | POA: Diagnosis not present

## 2018-07-28 DIAGNOSIS — G8929 Other chronic pain: Secondary | ICD-10-CM | POA: Diagnosis not present

## 2018-07-29 DIAGNOSIS — G8929 Other chronic pain: Secondary | ICD-10-CM | POA: Diagnosis not present

## 2018-07-30 DIAGNOSIS — G8929 Other chronic pain: Secondary | ICD-10-CM | POA: Diagnosis not present

## 2018-07-31 DIAGNOSIS — G8929 Other chronic pain: Secondary | ICD-10-CM | POA: Diagnosis not present

## 2018-08-01 DIAGNOSIS — G8929 Other chronic pain: Secondary | ICD-10-CM | POA: Diagnosis not present

## 2018-08-02 DIAGNOSIS — G8929 Other chronic pain: Secondary | ICD-10-CM | POA: Diagnosis not present

## 2018-08-03 DIAGNOSIS — I5031 Acute diastolic (congestive) heart failure: Secondary | ICD-10-CM | POA: Diagnosis not present

## 2018-08-03 DIAGNOSIS — S80921A Unspecified superficial injury of right lower leg, initial encounter: Secondary | ICD-10-CM | POA: Diagnosis not present

## 2018-08-03 DIAGNOSIS — R32 Unspecified urinary incontinence: Secondary | ICD-10-CM | POA: Diagnosis not present

## 2018-08-03 DIAGNOSIS — I10 Essential (primary) hypertension: Secondary | ICD-10-CM | POA: Diagnosis not present

## 2018-08-03 DIAGNOSIS — Z9981 Dependence on supplemental oxygen: Secondary | ICD-10-CM | POA: Diagnosis not present

## 2018-08-03 DIAGNOSIS — F329 Major depressive disorder, single episode, unspecified: Secondary | ICD-10-CM | POA: Diagnosis not present

## 2018-08-03 DIAGNOSIS — K219 Gastro-esophageal reflux disease without esophagitis: Secondary | ICD-10-CM | POA: Diagnosis not present

## 2018-08-03 DIAGNOSIS — G4730 Sleep apnea, unspecified: Secondary | ICD-10-CM | POA: Diagnosis not present

## 2018-08-03 DIAGNOSIS — I890 Lymphedema, not elsewhere classified: Secondary | ICD-10-CM | POA: Diagnosis not present

## 2018-08-03 DIAGNOSIS — G8929 Other chronic pain: Secondary | ICD-10-CM | POA: Diagnosis not present

## 2018-08-03 DIAGNOSIS — N390 Urinary tract infection, site not specified: Secondary | ICD-10-CM | POA: Diagnosis not present

## 2018-08-04 DIAGNOSIS — K219 Gastro-esophageal reflux disease without esophagitis: Secondary | ICD-10-CM | POA: Diagnosis not present

## 2018-08-04 DIAGNOSIS — S80921A Unspecified superficial injury of right lower leg, initial encounter: Secondary | ICD-10-CM | POA: Diagnosis not present

## 2018-08-04 DIAGNOSIS — N390 Urinary tract infection, site not specified: Secondary | ICD-10-CM | POA: Diagnosis not present

## 2018-08-04 DIAGNOSIS — I5031 Acute diastolic (congestive) heart failure: Secondary | ICD-10-CM | POA: Diagnosis not present

## 2018-08-04 DIAGNOSIS — F329 Major depressive disorder, single episode, unspecified: Secondary | ICD-10-CM | POA: Diagnosis not present

## 2018-08-04 DIAGNOSIS — I890 Lymphedema, not elsewhere classified: Secondary | ICD-10-CM | POA: Diagnosis not present

## 2018-08-04 DIAGNOSIS — R32 Unspecified urinary incontinence: Secondary | ICD-10-CM | POA: Diagnosis not present

## 2018-08-04 DIAGNOSIS — G8929 Other chronic pain: Secondary | ICD-10-CM | POA: Diagnosis not present

## 2018-08-04 DIAGNOSIS — I10 Essential (primary) hypertension: Secondary | ICD-10-CM | POA: Diagnosis not present

## 2018-08-04 DIAGNOSIS — G4730 Sleep apnea, unspecified: Secondary | ICD-10-CM | POA: Diagnosis not present

## 2018-08-04 DIAGNOSIS — Z9981 Dependence on supplemental oxygen: Secondary | ICD-10-CM | POA: Diagnosis not present

## 2018-08-05 DIAGNOSIS — G8929 Other chronic pain: Secondary | ICD-10-CM | POA: Diagnosis not present

## 2018-08-06 DIAGNOSIS — G8929 Other chronic pain: Secondary | ICD-10-CM | POA: Diagnosis not present

## 2018-08-07 DIAGNOSIS — G8929 Other chronic pain: Secondary | ICD-10-CM | POA: Diagnosis not present

## 2018-08-08 DIAGNOSIS — G8929 Other chronic pain: Secondary | ICD-10-CM | POA: Diagnosis not present

## 2018-08-09 DIAGNOSIS — G8929 Other chronic pain: Secondary | ICD-10-CM | POA: Diagnosis not present

## 2018-08-10 DIAGNOSIS — G8929 Other chronic pain: Secondary | ICD-10-CM | POA: Diagnosis not present

## 2018-08-11 DIAGNOSIS — G8929 Other chronic pain: Secondary | ICD-10-CM | POA: Diagnosis not present

## 2018-08-12 DIAGNOSIS — R0609 Other forms of dyspnea: Secondary | ICD-10-CM | POA: Diagnosis not present

## 2018-08-12 DIAGNOSIS — G8929 Other chronic pain: Secondary | ICD-10-CM | POA: Diagnosis not present

## 2018-08-12 DIAGNOSIS — J449 Chronic obstructive pulmonary disease, unspecified: Secondary | ICD-10-CM | POA: Diagnosis not present

## 2018-08-12 DIAGNOSIS — G4733 Obstructive sleep apnea (adult) (pediatric): Secondary | ICD-10-CM | POA: Diagnosis not present

## 2018-08-13 DIAGNOSIS — G8929 Other chronic pain: Secondary | ICD-10-CM | POA: Diagnosis not present

## 2018-08-14 DIAGNOSIS — G8929 Other chronic pain: Secondary | ICD-10-CM | POA: Diagnosis not present

## 2018-08-15 DIAGNOSIS — G8929 Other chronic pain: Secondary | ICD-10-CM | POA: Diagnosis not present

## 2018-08-16 DIAGNOSIS — G8929 Other chronic pain: Secondary | ICD-10-CM | POA: Diagnosis not present

## 2018-08-17 DIAGNOSIS — G8929 Other chronic pain: Secondary | ICD-10-CM | POA: Diagnosis not present

## 2018-08-17 DIAGNOSIS — S80921A Unspecified superficial injury of right lower leg, initial encounter: Secondary | ICD-10-CM | POA: Diagnosis not present

## 2018-08-17 DIAGNOSIS — F329 Major depressive disorder, single episode, unspecified: Secondary | ICD-10-CM | POA: Diagnosis not present

## 2018-08-17 DIAGNOSIS — N390 Urinary tract infection, site not specified: Secondary | ICD-10-CM | POA: Diagnosis not present

## 2018-08-17 DIAGNOSIS — G4730 Sleep apnea, unspecified: Secondary | ICD-10-CM | POA: Diagnosis not present

## 2018-08-17 DIAGNOSIS — I5031 Acute diastolic (congestive) heart failure: Secondary | ICD-10-CM | POA: Diagnosis not present

## 2018-08-17 DIAGNOSIS — Z9981 Dependence on supplemental oxygen: Secondary | ICD-10-CM | POA: Diagnosis not present

## 2018-08-17 DIAGNOSIS — R32 Unspecified urinary incontinence: Secondary | ICD-10-CM | POA: Diagnosis not present

## 2018-08-17 DIAGNOSIS — I890 Lymphedema, not elsewhere classified: Secondary | ICD-10-CM | POA: Diagnosis not present

## 2018-08-17 DIAGNOSIS — K219 Gastro-esophageal reflux disease without esophagitis: Secondary | ICD-10-CM | POA: Diagnosis not present

## 2018-08-17 DIAGNOSIS — I10 Essential (primary) hypertension: Secondary | ICD-10-CM | POA: Diagnosis not present

## 2018-08-18 DIAGNOSIS — G8929 Other chronic pain: Secondary | ICD-10-CM | POA: Diagnosis not present

## 2018-08-19 DIAGNOSIS — G8929 Other chronic pain: Secondary | ICD-10-CM | POA: Diagnosis not present

## 2018-08-20 DIAGNOSIS — K219 Gastro-esophageal reflux disease without esophagitis: Secondary | ICD-10-CM | POA: Diagnosis not present

## 2018-08-20 DIAGNOSIS — N390 Urinary tract infection, site not specified: Secondary | ICD-10-CM | POA: Diagnosis not present

## 2018-08-20 DIAGNOSIS — R32 Unspecified urinary incontinence: Secondary | ICD-10-CM | POA: Diagnosis not present

## 2018-08-20 DIAGNOSIS — I10 Essential (primary) hypertension: Secondary | ICD-10-CM | POA: Diagnosis not present

## 2018-08-20 DIAGNOSIS — G4730 Sleep apnea, unspecified: Secondary | ICD-10-CM | POA: Diagnosis not present

## 2018-08-20 DIAGNOSIS — I890 Lymphedema, not elsewhere classified: Secondary | ICD-10-CM | POA: Diagnosis not present

## 2018-08-20 DIAGNOSIS — F329 Major depressive disorder, single episode, unspecified: Secondary | ICD-10-CM | POA: Diagnosis not present

## 2018-08-20 DIAGNOSIS — S80921A Unspecified superficial injury of right lower leg, initial encounter: Secondary | ICD-10-CM | POA: Diagnosis not present

## 2018-08-20 DIAGNOSIS — I5031 Acute diastolic (congestive) heart failure: Secondary | ICD-10-CM | POA: Diagnosis not present

## 2018-08-20 DIAGNOSIS — Z9981 Dependence on supplemental oxygen: Secondary | ICD-10-CM | POA: Diagnosis not present

## 2018-08-20 DIAGNOSIS — G8929 Other chronic pain: Secondary | ICD-10-CM | POA: Diagnosis not present

## 2018-08-21 DIAGNOSIS — G8929 Other chronic pain: Secondary | ICD-10-CM | POA: Diagnosis not present

## 2018-08-22 DIAGNOSIS — M6281 Muscle weakness (generalized): Secondary | ICD-10-CM | POA: Diagnosis not present

## 2018-08-22 DIAGNOSIS — R0602 Shortness of breath: Secondary | ICD-10-CM | POA: Diagnosis not present

## 2018-08-22 DIAGNOSIS — G4733 Obstructive sleep apnea (adult) (pediatric): Secondary | ICD-10-CM | POA: Diagnosis not present

## 2018-08-22 DIAGNOSIS — I10 Essential (primary) hypertension: Secondary | ICD-10-CM | POA: Diagnosis not present

## 2018-08-22 DIAGNOSIS — G8929 Other chronic pain: Secondary | ICD-10-CM | POA: Diagnosis not present

## 2018-08-22 DIAGNOSIS — I5020 Unspecified systolic (congestive) heart failure: Secondary | ICD-10-CM | POA: Diagnosis not present

## 2018-08-23 DIAGNOSIS — G8929 Other chronic pain: Secondary | ICD-10-CM | POA: Diagnosis not present

## 2018-08-24 DIAGNOSIS — G8929 Other chronic pain: Secondary | ICD-10-CM | POA: Diagnosis not present

## 2018-08-25 DIAGNOSIS — G8929 Other chronic pain: Secondary | ICD-10-CM | POA: Diagnosis not present

## 2018-08-26 DIAGNOSIS — G8929 Other chronic pain: Secondary | ICD-10-CM | POA: Diagnosis not present

## 2018-08-27 DIAGNOSIS — G8929 Other chronic pain: Secondary | ICD-10-CM | POA: Diagnosis not present

## 2018-08-28 DIAGNOSIS — G8929 Other chronic pain: Secondary | ICD-10-CM | POA: Diagnosis not present

## 2018-08-29 DIAGNOSIS — G8929 Other chronic pain: Secondary | ICD-10-CM | POA: Diagnosis not present

## 2018-08-30 DIAGNOSIS — G8929 Other chronic pain: Secondary | ICD-10-CM | POA: Diagnosis not present

## 2018-08-31 DIAGNOSIS — G8929 Other chronic pain: Secondary | ICD-10-CM | POA: Diagnosis not present

## 2018-09-01 DIAGNOSIS — G8929 Other chronic pain: Secondary | ICD-10-CM | POA: Diagnosis not present

## 2018-09-02 DIAGNOSIS — G8929 Other chronic pain: Secondary | ICD-10-CM | POA: Diagnosis not present

## 2018-09-03 DIAGNOSIS — G8929 Other chronic pain: Secondary | ICD-10-CM | POA: Diagnosis not present

## 2018-09-04 DIAGNOSIS — G8929 Other chronic pain: Secondary | ICD-10-CM | POA: Diagnosis not present

## 2018-09-05 DIAGNOSIS — G8929 Other chronic pain: Secondary | ICD-10-CM | POA: Diagnosis not present

## 2018-09-06 DIAGNOSIS — G8929 Other chronic pain: Secondary | ICD-10-CM | POA: Diagnosis not present

## 2018-09-07 DIAGNOSIS — G8929 Other chronic pain: Secondary | ICD-10-CM | POA: Diagnosis not present

## 2018-09-08 DIAGNOSIS — G8929 Other chronic pain: Secondary | ICD-10-CM | POA: Diagnosis not present

## 2018-09-09 DIAGNOSIS — G8929 Other chronic pain: Secondary | ICD-10-CM | POA: Diagnosis not present

## 2018-09-10 DIAGNOSIS — Z9981 Dependence on supplemental oxygen: Secondary | ICD-10-CM | POA: Diagnosis not present

## 2018-09-10 DIAGNOSIS — I890 Lymphedema, not elsewhere classified: Secondary | ICD-10-CM | POA: Diagnosis not present

## 2018-09-10 DIAGNOSIS — K219 Gastro-esophageal reflux disease without esophagitis: Secondary | ICD-10-CM | POA: Diagnosis not present

## 2018-09-10 DIAGNOSIS — I5031 Acute diastolic (congestive) heart failure: Secondary | ICD-10-CM | POA: Diagnosis not present

## 2018-09-10 DIAGNOSIS — N390 Urinary tract infection, site not specified: Secondary | ICD-10-CM | POA: Diagnosis not present

## 2018-09-10 DIAGNOSIS — I10 Essential (primary) hypertension: Secondary | ICD-10-CM | POA: Diagnosis not present

## 2018-09-10 DIAGNOSIS — G8929 Other chronic pain: Secondary | ICD-10-CM | POA: Diagnosis not present

## 2018-09-10 DIAGNOSIS — G4730 Sleep apnea, unspecified: Secondary | ICD-10-CM | POA: Diagnosis not present

## 2018-09-10 DIAGNOSIS — S80921A Unspecified superficial injury of right lower leg, initial encounter: Secondary | ICD-10-CM | POA: Diagnosis not present

## 2018-09-10 DIAGNOSIS — R32 Unspecified urinary incontinence: Secondary | ICD-10-CM | POA: Diagnosis not present

## 2018-09-10 DIAGNOSIS — F329 Major depressive disorder, single episode, unspecified: Secondary | ICD-10-CM | POA: Diagnosis not present

## 2018-09-11 DIAGNOSIS — G8929 Other chronic pain: Secondary | ICD-10-CM | POA: Diagnosis not present

## 2018-09-12 DIAGNOSIS — G8929 Other chronic pain: Secondary | ICD-10-CM | POA: Diagnosis not present

## 2018-09-13 DIAGNOSIS — G8929 Other chronic pain: Secondary | ICD-10-CM | POA: Diagnosis not present

## 2018-09-13 DIAGNOSIS — S80921A Unspecified superficial injury of right lower leg, initial encounter: Secondary | ICD-10-CM | POA: Diagnosis not present

## 2018-09-13 DIAGNOSIS — I10 Essential (primary) hypertension: Secondary | ICD-10-CM | POA: Diagnosis not present

## 2018-09-13 DIAGNOSIS — I5031 Acute diastolic (congestive) heart failure: Secondary | ICD-10-CM | POA: Diagnosis not present

## 2018-09-13 DIAGNOSIS — I890 Lymphedema, not elsewhere classified: Secondary | ICD-10-CM | POA: Diagnosis not present

## 2018-09-13 DIAGNOSIS — N390 Urinary tract infection, site not specified: Secondary | ICD-10-CM | POA: Diagnosis not present

## 2018-09-13 DIAGNOSIS — G4730 Sleep apnea, unspecified: Secondary | ICD-10-CM | POA: Diagnosis not present

## 2018-09-13 DIAGNOSIS — R32 Unspecified urinary incontinence: Secondary | ICD-10-CM | POA: Diagnosis not present

## 2018-09-13 DIAGNOSIS — Z9981 Dependence on supplemental oxygen: Secondary | ICD-10-CM | POA: Diagnosis not present

## 2018-09-13 DIAGNOSIS — K219 Gastro-esophageal reflux disease without esophagitis: Secondary | ICD-10-CM | POA: Diagnosis not present

## 2018-09-13 DIAGNOSIS — F329 Major depressive disorder, single episode, unspecified: Secondary | ICD-10-CM | POA: Diagnosis not present

## 2018-09-14 DIAGNOSIS — G8929 Other chronic pain: Secondary | ICD-10-CM | POA: Diagnosis not present

## 2018-09-15 DIAGNOSIS — G8929 Other chronic pain: Secondary | ICD-10-CM | POA: Diagnosis not present

## 2018-09-16 DIAGNOSIS — G8929 Other chronic pain: Secondary | ICD-10-CM | POA: Diagnosis not present

## 2018-09-17 DIAGNOSIS — G8929 Other chronic pain: Secondary | ICD-10-CM | POA: Diagnosis not present

## 2018-09-18 DIAGNOSIS — G8929 Other chronic pain: Secondary | ICD-10-CM | POA: Diagnosis not present

## 2018-09-19 DIAGNOSIS — G8929 Other chronic pain: Secondary | ICD-10-CM | POA: Diagnosis not present

## 2018-09-20 DIAGNOSIS — G8929 Other chronic pain: Secondary | ICD-10-CM | POA: Diagnosis not present

## 2018-09-21 DIAGNOSIS — R32 Unspecified urinary incontinence: Secondary | ICD-10-CM | POA: Diagnosis not present

## 2018-09-21 DIAGNOSIS — I5031 Acute diastolic (congestive) heart failure: Secondary | ICD-10-CM | POA: Diagnosis not present

## 2018-09-21 DIAGNOSIS — S80921A Unspecified superficial injury of right lower leg, initial encounter: Secondary | ICD-10-CM | POA: Diagnosis not present

## 2018-09-21 DIAGNOSIS — F329 Major depressive disorder, single episode, unspecified: Secondary | ICD-10-CM | POA: Diagnosis not present

## 2018-09-21 DIAGNOSIS — I5020 Unspecified systolic (congestive) heart failure: Secondary | ICD-10-CM | POA: Diagnosis not present

## 2018-09-21 DIAGNOSIS — I890 Lymphedema, not elsewhere classified: Secondary | ICD-10-CM | POA: Diagnosis not present

## 2018-09-21 DIAGNOSIS — I10 Essential (primary) hypertension: Secondary | ICD-10-CM | POA: Diagnosis not present

## 2018-09-21 DIAGNOSIS — K219 Gastro-esophageal reflux disease without esophagitis: Secondary | ICD-10-CM | POA: Diagnosis not present

## 2018-09-21 DIAGNOSIS — R0602 Shortness of breath: Secondary | ICD-10-CM | POA: Diagnosis not present

## 2018-09-21 DIAGNOSIS — Z9981 Dependence on supplemental oxygen: Secondary | ICD-10-CM | POA: Diagnosis not present

## 2018-09-21 DIAGNOSIS — N390 Urinary tract infection, site not specified: Secondary | ICD-10-CM | POA: Diagnosis not present

## 2018-09-21 DIAGNOSIS — M6281 Muscle weakness (generalized): Secondary | ICD-10-CM | POA: Diagnosis not present

## 2018-09-21 DIAGNOSIS — G4733 Obstructive sleep apnea (adult) (pediatric): Secondary | ICD-10-CM | POA: Diagnosis not present

## 2018-09-21 DIAGNOSIS — G4730 Sleep apnea, unspecified: Secondary | ICD-10-CM | POA: Diagnosis not present

## 2018-09-21 DIAGNOSIS — G8929 Other chronic pain: Secondary | ICD-10-CM | POA: Diagnosis not present

## 2018-09-22 DIAGNOSIS — G8929 Other chronic pain: Secondary | ICD-10-CM | POA: Diagnosis not present

## 2018-09-23 DIAGNOSIS — G8929 Other chronic pain: Secondary | ICD-10-CM | POA: Diagnosis not present

## 2018-09-24 DIAGNOSIS — G8929 Other chronic pain: Secondary | ICD-10-CM | POA: Diagnosis not present

## 2018-09-25 DIAGNOSIS — G8929 Other chronic pain: Secondary | ICD-10-CM | POA: Diagnosis not present

## 2018-09-26 DIAGNOSIS — G8929 Other chronic pain: Secondary | ICD-10-CM | POA: Diagnosis not present

## 2018-09-27 DIAGNOSIS — G8929 Other chronic pain: Secondary | ICD-10-CM | POA: Diagnosis not present

## 2018-09-28 DIAGNOSIS — G8929 Other chronic pain: Secondary | ICD-10-CM | POA: Diagnosis not present

## 2018-09-29 DIAGNOSIS — G8929 Other chronic pain: Secondary | ICD-10-CM | POA: Diagnosis not present

## 2018-09-30 DIAGNOSIS — G8929 Other chronic pain: Secondary | ICD-10-CM | POA: Diagnosis not present

## 2018-10-01 DIAGNOSIS — G8929 Other chronic pain: Secondary | ICD-10-CM | POA: Diagnosis not present

## 2018-10-02 DIAGNOSIS — G8929 Other chronic pain: Secondary | ICD-10-CM | POA: Diagnosis not present

## 2018-10-03 DIAGNOSIS — G8929 Other chronic pain: Secondary | ICD-10-CM | POA: Diagnosis not present

## 2018-10-04 DIAGNOSIS — G8929 Other chronic pain: Secondary | ICD-10-CM | POA: Diagnosis not present

## 2018-10-05 DIAGNOSIS — G8929 Other chronic pain: Secondary | ICD-10-CM | POA: Diagnosis not present

## 2018-10-05 DIAGNOSIS — S80921A Unspecified superficial injury of right lower leg, initial encounter: Secondary | ICD-10-CM | POA: Diagnosis not present

## 2018-10-05 DIAGNOSIS — N390 Urinary tract infection, site not specified: Secondary | ICD-10-CM | POA: Diagnosis not present

## 2018-10-05 DIAGNOSIS — G4730 Sleep apnea, unspecified: Secondary | ICD-10-CM | POA: Diagnosis not present

## 2018-10-05 DIAGNOSIS — I5031 Acute diastolic (congestive) heart failure: Secondary | ICD-10-CM | POA: Diagnosis not present

## 2018-10-05 DIAGNOSIS — F329 Major depressive disorder, single episode, unspecified: Secondary | ICD-10-CM | POA: Diagnosis not present

## 2018-10-05 DIAGNOSIS — I890 Lymphedema, not elsewhere classified: Secondary | ICD-10-CM | POA: Diagnosis not present

## 2018-10-05 DIAGNOSIS — K219 Gastro-esophageal reflux disease without esophagitis: Secondary | ICD-10-CM | POA: Diagnosis not present

## 2018-10-05 DIAGNOSIS — R32 Unspecified urinary incontinence: Secondary | ICD-10-CM | POA: Diagnosis not present

## 2018-10-05 DIAGNOSIS — Z9981 Dependence on supplemental oxygen: Secondary | ICD-10-CM | POA: Diagnosis not present

## 2018-10-05 DIAGNOSIS — I10 Essential (primary) hypertension: Secondary | ICD-10-CM | POA: Diagnosis not present

## 2018-10-06 DIAGNOSIS — G8929 Other chronic pain: Secondary | ICD-10-CM | POA: Diagnosis not present

## 2018-10-07 DIAGNOSIS — G8929 Other chronic pain: Secondary | ICD-10-CM | POA: Diagnosis not present

## 2018-10-08 DIAGNOSIS — L089 Local infection of the skin and subcutaneous tissue, unspecified: Secondary | ICD-10-CM | POA: Diagnosis not present

## 2018-10-08 DIAGNOSIS — G8929 Other chronic pain: Secondary | ICD-10-CM | POA: Diagnosis not present

## 2018-10-09 DIAGNOSIS — G8929 Other chronic pain: Secondary | ICD-10-CM | POA: Diagnosis not present

## 2018-10-11 DIAGNOSIS — G8929 Other chronic pain: Secondary | ICD-10-CM | POA: Diagnosis not present

## 2018-10-12 DIAGNOSIS — G8929 Other chronic pain: Secondary | ICD-10-CM | POA: Diagnosis not present

## 2018-10-13 ENCOUNTER — Ambulatory Visit: Payer: Medicaid Other | Admitting: Internal Medicine

## 2018-10-13 DIAGNOSIS — G8929 Other chronic pain: Secondary | ICD-10-CM | POA: Diagnosis not present

## 2018-10-14 DIAGNOSIS — Z9981 Dependence on supplemental oxygen: Secondary | ICD-10-CM | POA: Diagnosis not present

## 2018-10-14 DIAGNOSIS — F329 Major depressive disorder, single episode, unspecified: Secondary | ICD-10-CM | POA: Diagnosis not present

## 2018-10-14 DIAGNOSIS — G8929 Other chronic pain: Secondary | ICD-10-CM | POA: Diagnosis not present

## 2018-10-14 DIAGNOSIS — I10 Essential (primary) hypertension: Secondary | ICD-10-CM | POA: Diagnosis not present

## 2018-10-14 DIAGNOSIS — S80921A Unspecified superficial injury of right lower leg, initial encounter: Secondary | ICD-10-CM | POA: Diagnosis not present

## 2018-10-14 DIAGNOSIS — G4730 Sleep apnea, unspecified: Secondary | ICD-10-CM | POA: Diagnosis not present

## 2018-10-14 DIAGNOSIS — I5031 Acute diastolic (congestive) heart failure: Secondary | ICD-10-CM | POA: Diagnosis not present

## 2018-10-14 DIAGNOSIS — N390 Urinary tract infection, site not specified: Secondary | ICD-10-CM | POA: Diagnosis not present

## 2018-10-14 DIAGNOSIS — I890 Lymphedema, not elsewhere classified: Secondary | ICD-10-CM | POA: Diagnosis not present

## 2018-10-14 DIAGNOSIS — K219 Gastro-esophageal reflux disease without esophagitis: Secondary | ICD-10-CM | POA: Diagnosis not present

## 2018-10-14 DIAGNOSIS — R32 Unspecified urinary incontinence: Secondary | ICD-10-CM | POA: Diagnosis not present

## 2018-10-15 DIAGNOSIS — G8929 Other chronic pain: Secondary | ICD-10-CM | POA: Diagnosis not present

## 2018-10-16 DIAGNOSIS — G8929 Other chronic pain: Secondary | ICD-10-CM | POA: Diagnosis not present

## 2018-10-17 DIAGNOSIS — G8929 Other chronic pain: Secondary | ICD-10-CM | POA: Diagnosis not present

## 2018-10-18 DIAGNOSIS — G8929 Other chronic pain: Secondary | ICD-10-CM | POA: Diagnosis not present

## 2018-10-19 DIAGNOSIS — I10 Essential (primary) hypertension: Secondary | ICD-10-CM | POA: Diagnosis not present

## 2018-10-19 DIAGNOSIS — G8929 Other chronic pain: Secondary | ICD-10-CM | POA: Diagnosis not present

## 2018-10-19 DIAGNOSIS — G4730 Sleep apnea, unspecified: Secondary | ICD-10-CM | POA: Diagnosis not present

## 2018-10-19 DIAGNOSIS — I5031 Acute diastolic (congestive) heart failure: Secondary | ICD-10-CM | POA: Diagnosis not present

## 2018-10-19 DIAGNOSIS — R32 Unspecified urinary incontinence: Secondary | ICD-10-CM | POA: Diagnosis not present

## 2018-10-19 DIAGNOSIS — F329 Major depressive disorder, single episode, unspecified: Secondary | ICD-10-CM | POA: Diagnosis not present

## 2018-10-19 DIAGNOSIS — N390 Urinary tract infection, site not specified: Secondary | ICD-10-CM | POA: Diagnosis not present

## 2018-10-19 DIAGNOSIS — I890 Lymphedema, not elsewhere classified: Secondary | ICD-10-CM | POA: Diagnosis not present

## 2018-10-19 DIAGNOSIS — S80921A Unspecified superficial injury of right lower leg, initial encounter: Secondary | ICD-10-CM | POA: Diagnosis not present

## 2018-10-19 DIAGNOSIS — Z9981 Dependence on supplemental oxygen: Secondary | ICD-10-CM | POA: Diagnosis not present

## 2018-10-19 DIAGNOSIS — K219 Gastro-esophageal reflux disease without esophagitis: Secondary | ICD-10-CM | POA: Diagnosis not present

## 2018-10-20 DIAGNOSIS — G8929 Other chronic pain: Secondary | ICD-10-CM | POA: Diagnosis not present

## 2018-10-21 DIAGNOSIS — G8929 Other chronic pain: Secondary | ICD-10-CM | POA: Diagnosis not present

## 2018-10-22 DIAGNOSIS — I10 Essential (primary) hypertension: Secondary | ICD-10-CM | POA: Diagnosis not present

## 2018-10-22 DIAGNOSIS — M6281 Muscle weakness (generalized): Secondary | ICD-10-CM | POA: Diagnosis not present

## 2018-10-22 DIAGNOSIS — R0602 Shortness of breath: Secondary | ICD-10-CM | POA: Diagnosis not present

## 2018-10-22 DIAGNOSIS — G8929 Other chronic pain: Secondary | ICD-10-CM | POA: Diagnosis not present

## 2018-10-22 DIAGNOSIS — G4733 Obstructive sleep apnea (adult) (pediatric): Secondary | ICD-10-CM | POA: Diagnosis not present

## 2018-10-22 DIAGNOSIS — I5020 Unspecified systolic (congestive) heart failure: Secondary | ICD-10-CM | POA: Diagnosis not present

## 2018-10-23 DIAGNOSIS — G8929 Other chronic pain: Secondary | ICD-10-CM | POA: Diagnosis not present

## 2018-10-24 DIAGNOSIS — G8929 Other chronic pain: Secondary | ICD-10-CM | POA: Diagnosis not present

## 2018-10-25 DIAGNOSIS — G8929 Other chronic pain: Secondary | ICD-10-CM | POA: Diagnosis not present

## 2018-10-26 DIAGNOSIS — G8929 Other chronic pain: Secondary | ICD-10-CM | POA: Diagnosis not present

## 2018-10-27 DIAGNOSIS — G8929 Other chronic pain: Secondary | ICD-10-CM | POA: Diagnosis not present

## 2018-10-28 DIAGNOSIS — G8929 Other chronic pain: Secondary | ICD-10-CM | POA: Diagnosis not present

## 2018-10-29 DIAGNOSIS — G8929 Other chronic pain: Secondary | ICD-10-CM | POA: Diagnosis not present

## 2018-10-30 DIAGNOSIS — G8929 Other chronic pain: Secondary | ICD-10-CM | POA: Diagnosis not present

## 2018-10-31 DIAGNOSIS — G8929 Other chronic pain: Secondary | ICD-10-CM | POA: Diagnosis not present

## 2018-11-01 DIAGNOSIS — G8929 Other chronic pain: Secondary | ICD-10-CM | POA: Diagnosis not present

## 2018-11-02 DIAGNOSIS — G8929 Other chronic pain: Secondary | ICD-10-CM | POA: Diagnosis not present

## 2018-11-03 DIAGNOSIS — G8929 Other chronic pain: Secondary | ICD-10-CM | POA: Diagnosis not present

## 2018-11-03 DIAGNOSIS — Z1211 Encounter for screening for malignant neoplasm of colon: Secondary | ICD-10-CM | POA: Diagnosis not present

## 2018-11-04 DIAGNOSIS — G8929 Other chronic pain: Secondary | ICD-10-CM | POA: Diagnosis not present

## 2018-11-05 DIAGNOSIS — E538 Deficiency of other specified B group vitamins: Secondary | ICD-10-CM | POA: Diagnosis not present

## 2018-11-05 DIAGNOSIS — I5030 Unspecified diastolic (congestive) heart failure: Secondary | ICD-10-CM | POA: Diagnosis not present

## 2018-11-05 DIAGNOSIS — D7589 Other specified diseases of blood and blood-forming organs: Secondary | ICD-10-CM | POA: Diagnosis not present

## 2018-11-05 DIAGNOSIS — Z9981 Dependence on supplemental oxygen: Secondary | ICD-10-CM | POA: Diagnosis not present

## 2018-11-05 DIAGNOSIS — G8929 Other chronic pain: Secondary | ICD-10-CM | POA: Diagnosis not present

## 2018-11-05 DIAGNOSIS — E872 Acidosis: Secondary | ICD-10-CM | POA: Diagnosis not present

## 2018-11-05 DIAGNOSIS — M25552 Pain in left hip: Secondary | ICD-10-CM | POA: Diagnosis not present

## 2018-11-05 DIAGNOSIS — Z23 Encounter for immunization: Secondary | ICD-10-CM | POA: Diagnosis not present

## 2018-11-05 DIAGNOSIS — I10 Essential (primary) hypertension: Secondary | ICD-10-CM | POA: Diagnosis not present

## 2018-11-05 DIAGNOSIS — E559 Vitamin D deficiency, unspecified: Secondary | ICD-10-CM | POA: Diagnosis not present

## 2018-11-05 DIAGNOSIS — G4733 Obstructive sleep apnea (adult) (pediatric): Secondary | ICD-10-CM | POA: Diagnosis not present

## 2018-11-05 DIAGNOSIS — R0902 Hypoxemia: Secondary | ICD-10-CM | POA: Diagnosis not present

## 2018-11-06 DIAGNOSIS — G8929 Other chronic pain: Secondary | ICD-10-CM | POA: Diagnosis not present

## 2018-11-07 DIAGNOSIS — G8929 Other chronic pain: Secondary | ICD-10-CM | POA: Diagnosis not present

## 2018-11-08 DIAGNOSIS — G8929 Other chronic pain: Secondary | ICD-10-CM | POA: Diagnosis not present

## 2018-11-09 DIAGNOSIS — G8929 Other chronic pain: Secondary | ICD-10-CM | POA: Diagnosis not present

## 2018-11-10 DIAGNOSIS — G8929 Other chronic pain: Secondary | ICD-10-CM | POA: Diagnosis not present

## 2018-11-11 DIAGNOSIS — G8929 Other chronic pain: Secondary | ICD-10-CM | POA: Diagnosis not present

## 2018-11-12 DIAGNOSIS — G8929 Other chronic pain: Secondary | ICD-10-CM | POA: Diagnosis not present

## 2018-11-13 DIAGNOSIS — G8929 Other chronic pain: Secondary | ICD-10-CM | POA: Diagnosis not present

## 2018-11-14 DIAGNOSIS — G8929 Other chronic pain: Secondary | ICD-10-CM | POA: Diagnosis not present

## 2018-11-15 DIAGNOSIS — G8929 Other chronic pain: Secondary | ICD-10-CM | POA: Diagnosis not present

## 2018-11-16 DIAGNOSIS — S80921A Unspecified superficial injury of right lower leg, initial encounter: Secondary | ICD-10-CM | POA: Diagnosis not present

## 2018-11-16 DIAGNOSIS — K219 Gastro-esophageal reflux disease without esophagitis: Secondary | ICD-10-CM | POA: Diagnosis not present

## 2018-11-16 DIAGNOSIS — G8929 Other chronic pain: Secondary | ICD-10-CM | POA: Diagnosis not present

## 2018-11-16 DIAGNOSIS — G4730 Sleep apnea, unspecified: Secondary | ICD-10-CM | POA: Diagnosis not present

## 2018-11-16 DIAGNOSIS — N390 Urinary tract infection, site not specified: Secondary | ICD-10-CM | POA: Diagnosis not present

## 2018-11-16 DIAGNOSIS — Z9981 Dependence on supplemental oxygen: Secondary | ICD-10-CM | POA: Diagnosis not present

## 2018-11-16 DIAGNOSIS — I5031 Acute diastolic (congestive) heart failure: Secondary | ICD-10-CM | POA: Diagnosis not present

## 2018-11-16 DIAGNOSIS — I10 Essential (primary) hypertension: Secondary | ICD-10-CM | POA: Diagnosis not present

## 2018-11-16 DIAGNOSIS — F329 Major depressive disorder, single episode, unspecified: Secondary | ICD-10-CM | POA: Diagnosis not present

## 2018-11-16 DIAGNOSIS — R32 Unspecified urinary incontinence: Secondary | ICD-10-CM | POA: Diagnosis not present

## 2018-11-16 DIAGNOSIS — I890 Lymphedema, not elsewhere classified: Secondary | ICD-10-CM | POA: Diagnosis not present

## 2018-11-17 DIAGNOSIS — G8929 Other chronic pain: Secondary | ICD-10-CM | POA: Diagnosis not present

## 2018-11-19 DIAGNOSIS — G8929 Other chronic pain: Secondary | ICD-10-CM | POA: Diagnosis not present

## 2018-11-20 DIAGNOSIS — G8929 Other chronic pain: Secondary | ICD-10-CM | POA: Diagnosis not present

## 2018-11-21 DIAGNOSIS — G8929 Other chronic pain: Secondary | ICD-10-CM | POA: Diagnosis not present

## 2018-11-21 DIAGNOSIS — M6281 Muscle weakness (generalized): Secondary | ICD-10-CM | POA: Diagnosis not present

## 2018-11-21 DIAGNOSIS — I5020 Unspecified systolic (congestive) heart failure: Secondary | ICD-10-CM | POA: Diagnosis not present

## 2018-11-21 DIAGNOSIS — R0602 Shortness of breath: Secondary | ICD-10-CM | POA: Diagnosis not present

## 2018-11-22 DIAGNOSIS — G8929 Other chronic pain: Secondary | ICD-10-CM | POA: Diagnosis not present

## 2018-11-22 NOTE — Progress Notes (Signed)
Patient ID: [name], female    DOB: [birth year], 59 y.o.   MRN: [id number]  HPI  [name] is a 57 y/o female with a history of obstructive sleep apnea, morbid obesity, lymphedema, HTN, GERD, depression and chronic heart failure.  Echo report from 03/11/18 reviewed and showed an EF of 55-60%. Echo report done 03/11/18 reviewed and showed an EF of 55-60%. Echo done 05/07/16 showed an EF of 60-65%. No valvular regurgitation. Technically difficult study however due to body habitus and patient was in a wheelchair.   Has not been admitted or been in the ED in the last 6 months.   She presents today for a follow-up visit with a chief complaint of moderate fatigue upon minimal exertion. She describes this as chronic in nature having been present for several years. She has associated shortness of breath, chest pain, lymphedema, left hip pain and slight weight gain along with this. She denies any difficulty sleeping, abdominal distention, palpitations, wheezing, cough or dizziness. Hasn't been able to wear her compression boots for ~ 1 month as she's waiting on a new lift recliner to be delivered.   Past Medical History:  Diagnosis Date  . Asthma 01/25/2018  . CHF (congestive heart failure) (HCC)   . Depression   . GERD (gastroesophageal reflux disease)   . Hypertension   . Lymphedema   . Morbid obesity (HCC)   . Sleep apnea    No past surgical history on file.  Family History  Problem Relation Age of Onset  . CVA Father   . Hypertension Father   . Hypertension Mother    Social History   Tobacco Use  . Smoking status: Former Smoker    Years: 10.00    Types: Cigarettes  . Smokeless tobacco: Never Used  . Tobacco comment: quit 6 years ago  Substance Use Topics  . Alcohol use: Yes    Alcohol/week: 1.0 standard drinks    Types: 1 Standard drinks or equivalent per week    Comment: occasional alcohol use on weekend   No Known Allergies  Prior to Admission medications   Medication  Sig Start Date End Date Taking? Authorizing Provider  albuterol (PROVENTIL HFA;VENTOLIN HFA) 108 (90 Base) MCG/ACT inhaler Inhale 2 puffs into the lungs every 6 (six) hours as needed for wheezing or shortness of breath.   Yes [provider]  aspirin EC 81 MG tablet Take 1 tablet (81 mg total) by mouth daily. 07/22/17  Yes End, Cristal Deer, MD  atenolol (TENORMIN) 50 MG tablet Take 1 tablet (50 mg total) by mouth 2 (two) times daily. 02/13/17  Yes Clarisa Kindred A, FNP  b complex vitamins tablet Take 2 tablets by mouth daily.   Yes [provider]  Cholecalciferol 250 MCG (10000 UT) CAPS Take 10,000 Units by mouth once a week.   Yes [provider]  diclofenac sodium (VOLTAREN) 1 % GEL Apply 2 g topically 2 (two) times daily as needed.   Yes [provider]  Dimethicone-Zinc Oxide (SOOTHE & COOL INZO BARRIER) 5-5 % CREA Apply 1 application topically as needed.   Yes [provider]  furosemide (LASIX) 80 MG tablet Take 1 tablet (80 mg total) by mouth daily. 02/13/17  Yes Clarisa Kindred A, FNP  losartan (COZAAR) 100 MG tablet Take 1 tablet (100 mg total) by mouth daily. 06/23/18 11/25/19 Yes End, Cristal Deer, MD  metolazone (ZAROXOLYN) 2.5 MG tablet Take 1 tablet (2.5 mg total) by mouth 2 (two) times a week. 05/27/18  Yes Delma Freeze, FNP  Zinc Oxide (DR SMITHS DIAPER RASH EX) Apply topically.   Yes [provider]    Review of Systems  Constitutional: Positive for fatigue. Negative for appetite change.  HENT: Negative for congestion, postnasal drip and sore throat.   Eyes: Negative.   Respiratory: Positive for shortness of breath. Negative for cough, chest tightness and wheezing.   Cardiovascular: Positive for chest pain (last week) and leg swelling (minimal). Negative for palpitations.  Gastrointestinal: Negative for abdominal distention and abdominal pain.  Endocrine: Negative.   Genitourinary: Negative.   Musculoskeletal: Positive for  arthralgias (left hip) and back pain.  Skin: Negative.   Allergic/Immunologic: Negative.   Neurological: Negative for dizziness and light-headedness.  Hematological: Negative for adenopathy. Does not bruise/bleed easily.  Psychiatric/Behavioral: Negative for dysphoric mood, sleep disturbance (sleeping on 1 pillow; wears CPAP at bedtime ) and suicidal ideas. The patient is not nervous/anxious.    Vitals:   11/24/18 0904  BP: 122/78  Pulse: 77  Resp: 18  SpO2: 92%  Weight: (!) 454 lb 2 oz (206 kg)  Height: 5\' 1"  (1.549 m)   Wt Readings from Last 3 Encounters:  11/24/18 (!) 454 lb 2 oz (206 kg)  06/23/18 (!) 454 lb (205.9 kg)  05/25/18 (!) 449 lb 2 oz (203.7 kg)   Lab Results  Component Value Date   CREATININE 0.98 07/06/2018   CREATININE 0.98 08/14/2017   CREATININE 1.17 (H) 07/22/2017    Physical Exam  Constitutional: She is oriented to person, place, and time. She appears well-developed and well-nourished.  HENT:  Head: Normocephalic and atraumatic.  Neck: Normal range of motion. Neck supple. No JVD present.  Cardiovascular: Normal rate and regular rhythm.  Pulmonary/Chest: Effort normal. She has no wheezes. She has no rales.  Abdominal: Soft. She exhibits no distension. There is no tenderness.  Musculoskeletal: She exhibits edema (chronic lymphedema ). She exhibits no tenderness.  Neurological: She is alert and oriented to person, place, and time.  Skin: Skin is warm and dry.  Psychiatric: She has a normal mood and affect. Her behavior is normal. Thought content normal.  Nursing note and vitals reviewed.  Assessment & Plan:  1: Chronic heart failure with preserved ejection fraction- - NYHA class III - difficult to assess fluid status due to patient's weight - weighing daily when her aide is there. Reminded to call for an overnight weight gain of >2 pounds or a weekly weight gain of >5 pounds - weight up 5 pounds since she was last here 6 months ago - saw cardiologist  (End) 06/23/18 - admits to not getting any exercise due to chronic bilateral knee pain & now left hip pain; does use a walker when she's up walking around - not adding salt to her food - drinks ~ 64 ounces of water daily - PharmD reconciled medications with the patient - has gotten her flu vaccine for this season  2: HTN- - BP looks good today - saw PCP Vinson Moselle) 11/05/18 - BMP 11/05/18 reviewed and shows sodium 139, potassium 4.5, Cr 0.69 and GFR >90  3: Obstructive sleep apnea- - wears oxygen at 2L during the day as needed - wearing CPAP at bedtime - saw pulmonologist Meredeth Ide) 08/12/18  4: Lymphedema- - Stage 3 lymphedema - unable to wear compression socks due to body habitus - hasn't been able to wear her compression boots due to waiting on a lift recliner to arrive  Medication bottles were reviewed.  Will see cardiology in  3 months so will have patient return in 7 months or sooner for any questions/problems before then.

## 2018-11-23 DIAGNOSIS — Z9981 Dependence on supplemental oxygen: Secondary | ICD-10-CM | POA: Diagnosis not present

## 2018-11-23 DIAGNOSIS — F329 Major depressive disorder, single episode, unspecified: Secondary | ICD-10-CM | POA: Diagnosis not present

## 2018-11-23 DIAGNOSIS — I10 Essential (primary) hypertension: Secondary | ICD-10-CM | POA: Diagnosis not present

## 2018-11-23 DIAGNOSIS — S80921A Unspecified superficial injury of right lower leg, initial encounter: Secondary | ICD-10-CM | POA: Diagnosis not present

## 2018-11-23 DIAGNOSIS — I5031 Acute diastolic (congestive) heart failure: Secondary | ICD-10-CM | POA: Diagnosis not present

## 2018-11-23 DIAGNOSIS — G4730 Sleep apnea, unspecified: Secondary | ICD-10-CM | POA: Diagnosis not present

## 2018-11-23 DIAGNOSIS — I890 Lymphedema, not elsewhere classified: Secondary | ICD-10-CM | POA: Diagnosis not present

## 2018-11-23 DIAGNOSIS — G8929 Other chronic pain: Secondary | ICD-10-CM | POA: Diagnosis not present

## 2018-11-23 DIAGNOSIS — N390 Urinary tract infection, site not specified: Secondary | ICD-10-CM | POA: Diagnosis not present

## 2018-11-23 DIAGNOSIS — R32 Unspecified urinary incontinence: Secondary | ICD-10-CM | POA: Diagnosis not present

## 2018-11-23 DIAGNOSIS — K219 Gastro-esophageal reflux disease without esophagitis: Secondary | ICD-10-CM | POA: Diagnosis not present

## 2018-11-24 ENCOUNTER — Ambulatory Visit: Payer: Medicaid Other | Attending: Family | Admitting: Family

## 2018-11-24 ENCOUNTER — Encounter: Payer: Self-pay | Admitting: Family

## 2018-11-24 VITALS — BP 122/78 | HR 77 | Resp 18 | Ht 61.0 in | Wt >= 6400 oz

## 2018-11-24 DIAGNOSIS — I509 Heart failure, unspecified: Secondary | ICD-10-CM | POA: Insufficient documentation

## 2018-11-24 DIAGNOSIS — I110 Hypertensive heart disease with heart failure: Secondary | ICD-10-CM | POA: Diagnosis not present

## 2018-11-24 DIAGNOSIS — Z8249 Family history of ischemic heart disease and other diseases of the circulatory system: Secondary | ICD-10-CM | POA: Insufficient documentation

## 2018-11-24 DIAGNOSIS — G4733 Obstructive sleep apnea (adult) (pediatric): Secondary | ICD-10-CM | POA: Diagnosis not present

## 2018-11-24 DIAGNOSIS — I890 Lymphedema, not elsewhere classified: Secondary | ICD-10-CM | POA: Diagnosis not present

## 2018-11-24 DIAGNOSIS — K219 Gastro-esophageal reflux disease without esophagitis: Secondary | ICD-10-CM | POA: Insufficient documentation

## 2018-11-24 DIAGNOSIS — Z79899 Other long term (current) drug therapy: Secondary | ICD-10-CM | POA: Diagnosis not present

## 2018-11-24 DIAGNOSIS — Z7982 Long term (current) use of aspirin: Secondary | ICD-10-CM | POA: Insufficient documentation

## 2018-11-24 DIAGNOSIS — Z87891 Personal history of nicotine dependence: Secondary | ICD-10-CM | POA: Diagnosis not present

## 2018-11-24 DIAGNOSIS — I5032 Chronic diastolic (congestive) heart failure: Secondary | ICD-10-CM

## 2018-11-24 DIAGNOSIS — F329 Major depressive disorder, single episode, unspecified: Secondary | ICD-10-CM | POA: Insufficient documentation

## 2018-11-24 DIAGNOSIS — I10 Essential (primary) hypertension: Secondary | ICD-10-CM

## 2018-11-24 DIAGNOSIS — G8929 Other chronic pain: Secondary | ICD-10-CM | POA: Diagnosis not present

## 2018-11-24 NOTE — Patient Instructions (Signed)
Continue weighing daily and call for an overnight weight gain of > 2 pounds or a weekly weight gain of >5 pounds. 

## 2018-11-25 DIAGNOSIS — G8929 Other chronic pain: Secondary | ICD-10-CM | POA: Diagnosis not present

## 2018-11-26 ENCOUNTER — Ambulatory Visit: Payer: Medicaid Other | Admitting: Internal Medicine

## 2018-11-26 DIAGNOSIS — G8929 Other chronic pain: Secondary | ICD-10-CM | POA: Diagnosis not present

## 2018-11-27 DIAGNOSIS — G8929 Other chronic pain: Secondary | ICD-10-CM | POA: Diagnosis not present

## 2018-11-28 DIAGNOSIS — G8929 Other chronic pain: Secondary | ICD-10-CM | POA: Diagnosis not present

## 2018-11-29 DIAGNOSIS — G8929 Other chronic pain: Secondary | ICD-10-CM | POA: Diagnosis not present

## 2018-11-30 DIAGNOSIS — G8929 Other chronic pain: Secondary | ICD-10-CM | POA: Diagnosis not present

## 2018-12-01 DIAGNOSIS — G8929 Other chronic pain: Secondary | ICD-10-CM | POA: Diagnosis not present

## 2018-12-02 DIAGNOSIS — G8929 Other chronic pain: Secondary | ICD-10-CM | POA: Diagnosis not present

## 2018-12-03 DIAGNOSIS — G8929 Other chronic pain: Secondary | ICD-10-CM | POA: Diagnosis not present

## 2018-12-04 DIAGNOSIS — G8929 Other chronic pain: Secondary | ICD-10-CM | POA: Diagnosis not present

## 2018-12-05 DIAGNOSIS — G8929 Other chronic pain: Secondary | ICD-10-CM | POA: Diagnosis not present

## 2018-12-06 DIAGNOSIS — G8929 Other chronic pain: Secondary | ICD-10-CM | POA: Diagnosis not present

## 2018-12-07 ENCOUNTER — Other Ambulatory Visit: Payer: Self-pay | Admitting: Family

## 2018-12-07 DIAGNOSIS — G8929 Other chronic pain: Secondary | ICD-10-CM | POA: Diagnosis not present

## 2018-12-07 MED ORDER — METOLAZONE 2.5 MG PO TABS: 2.5000 mg | ORAL_TABLET | ORAL | 5 refills | Status: DC

## 2018-12-08 DIAGNOSIS — G8929 Other chronic pain: Secondary | ICD-10-CM | POA: Diagnosis not present

## 2018-12-09 DIAGNOSIS — G8929 Other chronic pain: Secondary | ICD-10-CM | POA: Diagnosis not present

## 2018-12-10 DIAGNOSIS — G8929 Other chronic pain: Secondary | ICD-10-CM | POA: Diagnosis not present

## 2018-12-11 DIAGNOSIS — G8929 Other chronic pain: Secondary | ICD-10-CM | POA: Diagnosis not present

## 2018-12-12 DIAGNOSIS — G8929 Other chronic pain: Secondary | ICD-10-CM | POA: Diagnosis not present

## 2018-12-13 DIAGNOSIS — G8929 Other chronic pain: Secondary | ICD-10-CM | POA: Diagnosis not present

## 2018-12-14 DIAGNOSIS — G8929 Other chronic pain: Secondary | ICD-10-CM | POA: Diagnosis not present

## 2018-12-16 DIAGNOSIS — G8929 Other chronic pain: Secondary | ICD-10-CM | POA: Diagnosis not present

## 2018-12-17 DIAGNOSIS — G8929 Other chronic pain: Secondary | ICD-10-CM | POA: Diagnosis not present

## 2018-12-18 DIAGNOSIS — G8929 Other chronic pain: Secondary | ICD-10-CM | POA: Diagnosis not present

## 2018-12-19 DIAGNOSIS — G8929 Other chronic pain: Secondary | ICD-10-CM | POA: Diagnosis not present

## 2018-12-20 DIAGNOSIS — G8929 Other chronic pain: Secondary | ICD-10-CM | POA: Diagnosis not present

## 2018-12-21 DIAGNOSIS — N390 Urinary tract infection, site not specified: Secondary | ICD-10-CM | POA: Diagnosis not present

## 2018-12-21 DIAGNOSIS — Z9981 Dependence on supplemental oxygen: Secondary | ICD-10-CM | POA: Diagnosis not present

## 2018-12-21 DIAGNOSIS — K219 Gastro-esophageal reflux disease without esophagitis: Secondary | ICD-10-CM | POA: Diagnosis not present

## 2018-12-21 DIAGNOSIS — R32 Unspecified urinary incontinence: Secondary | ICD-10-CM | POA: Diagnosis not present

## 2018-12-21 DIAGNOSIS — G4730 Sleep apnea, unspecified: Secondary | ICD-10-CM | POA: Diagnosis not present

## 2018-12-21 DIAGNOSIS — I890 Lymphedema, not elsewhere classified: Secondary | ICD-10-CM | POA: Diagnosis not present

## 2018-12-21 DIAGNOSIS — I10 Essential (primary) hypertension: Secondary | ICD-10-CM | POA: Diagnosis not present

## 2018-12-21 DIAGNOSIS — F329 Major depressive disorder, single episode, unspecified: Secondary | ICD-10-CM | POA: Diagnosis not present

## 2018-12-21 DIAGNOSIS — G8929 Other chronic pain: Secondary | ICD-10-CM | POA: Diagnosis not present

## 2018-12-21 DIAGNOSIS — I5031 Acute diastolic (congestive) heart failure: Secondary | ICD-10-CM | POA: Diagnosis not present

## 2018-12-21 DIAGNOSIS — S80921A Unspecified superficial injury of right lower leg, initial encounter: Secondary | ICD-10-CM | POA: Diagnosis not present

## 2018-12-22 DIAGNOSIS — R0602 Shortness of breath: Secondary | ICD-10-CM | POA: Diagnosis not present

## 2018-12-22 DIAGNOSIS — M6281 Muscle weakness (generalized): Secondary | ICD-10-CM | POA: Diagnosis not present

## 2018-12-22 DIAGNOSIS — G8929 Other chronic pain: Secondary | ICD-10-CM | POA: Diagnosis not present

## 2018-12-22 DIAGNOSIS — I5020 Unspecified systolic (congestive) heart failure: Secondary | ICD-10-CM | POA: Diagnosis not present

## 2018-12-23 DIAGNOSIS — G8929 Other chronic pain: Secondary | ICD-10-CM | POA: Diagnosis not present

## 2018-12-24 DIAGNOSIS — G8929 Other chronic pain: Secondary | ICD-10-CM | POA: Diagnosis not present

## 2018-12-25 DIAGNOSIS — G8929 Other chronic pain: Secondary | ICD-10-CM | POA: Diagnosis not present

## 2018-12-26 DIAGNOSIS — G8929 Other chronic pain: Secondary | ICD-10-CM | POA: Diagnosis not present

## 2018-12-27 DIAGNOSIS — G8929 Other chronic pain: Secondary | ICD-10-CM | POA: Diagnosis not present

## 2018-12-28 DIAGNOSIS — G8929 Other chronic pain: Secondary | ICD-10-CM | POA: Diagnosis not present

## 2018-12-29 DIAGNOSIS — G8929 Other chronic pain: Secondary | ICD-10-CM | POA: Diagnosis not present

## 2018-12-30 DIAGNOSIS — G8929 Other chronic pain: Secondary | ICD-10-CM | POA: Diagnosis not present

## 2018-12-31 DIAGNOSIS — G8929 Other chronic pain: Secondary | ICD-10-CM | POA: Diagnosis not present

## 2019-01-02 DIAGNOSIS — G8929 Other chronic pain: Secondary | ICD-10-CM | POA: Diagnosis not present

## 2019-01-03 DIAGNOSIS — G8929 Other chronic pain: Secondary | ICD-10-CM | POA: Diagnosis not present

## 2019-01-04 DIAGNOSIS — G8929 Other chronic pain: Secondary | ICD-10-CM | POA: Diagnosis not present

## 2019-01-05 DIAGNOSIS — G8929 Other chronic pain: Secondary | ICD-10-CM | POA: Diagnosis not present

## 2019-01-06 DIAGNOSIS — G8929 Other chronic pain: Secondary | ICD-10-CM | POA: Diagnosis not present

## 2019-01-07 DIAGNOSIS — G8929 Other chronic pain: Secondary | ICD-10-CM | POA: Diagnosis not present

## 2019-01-08 ENCOUNTER — Other Ambulatory Visit: Payer: Self-pay | Admitting: Internal Medicine

## 2019-01-08 DIAGNOSIS — G8929 Other chronic pain: Secondary | ICD-10-CM | POA: Diagnosis not present

## 2019-01-10 DIAGNOSIS — G8929 Other chronic pain: Secondary | ICD-10-CM | POA: Diagnosis not present

## 2019-01-10 DIAGNOSIS — M6281 Muscle weakness (generalized): Secondary | ICD-10-CM | POA: Diagnosis not present

## 2019-01-10 DIAGNOSIS — R32 Unspecified urinary incontinence: Secondary | ICD-10-CM | POA: Diagnosis not present

## 2019-01-10 DIAGNOSIS — R0602 Shortness of breath: Secondary | ICD-10-CM | POA: Diagnosis not present

## 2019-01-10 DIAGNOSIS — I5020 Unspecified systolic (congestive) heart failure: Secondary | ICD-10-CM | POA: Diagnosis not present

## 2019-01-11 DIAGNOSIS — G8929 Other chronic pain: Secondary | ICD-10-CM | POA: Diagnosis not present

## 2019-01-12 DIAGNOSIS — G8929 Other chronic pain: Secondary | ICD-10-CM | POA: Diagnosis not present

## 2019-01-13 DIAGNOSIS — G8929 Other chronic pain: Secondary | ICD-10-CM | POA: Diagnosis not present

## 2019-01-14 DIAGNOSIS — G8929 Other chronic pain: Secondary | ICD-10-CM | POA: Diagnosis not present

## 2019-01-15 DIAGNOSIS — G8929 Other chronic pain: Secondary | ICD-10-CM | POA: Diagnosis not present

## 2019-01-17 DIAGNOSIS — G8929 Other chronic pain: Secondary | ICD-10-CM | POA: Diagnosis not present

## 2019-01-18 DIAGNOSIS — G8929 Other chronic pain: Secondary | ICD-10-CM | POA: Diagnosis not present

## 2019-01-20 DIAGNOSIS — G8929 Other chronic pain: Secondary | ICD-10-CM | POA: Diagnosis not present

## 2019-01-21 DIAGNOSIS — G8929 Other chronic pain: Secondary | ICD-10-CM | POA: Diagnosis not present

## 2019-01-22 DIAGNOSIS — M6281 Muscle weakness (generalized): Secondary | ICD-10-CM | POA: Diagnosis not present

## 2019-01-22 DIAGNOSIS — R0602 Shortness of breath: Secondary | ICD-10-CM | POA: Diagnosis not present

## 2019-01-22 DIAGNOSIS — G8929 Other chronic pain: Secondary | ICD-10-CM | POA: Diagnosis not present

## 2019-01-22 DIAGNOSIS — I5020 Unspecified systolic (congestive) heart failure: Secondary | ICD-10-CM | POA: Diagnosis not present

## 2019-01-23 DIAGNOSIS — G8929 Other chronic pain: Secondary | ICD-10-CM | POA: Diagnosis not present

## 2019-01-24 DIAGNOSIS — G8929 Other chronic pain: Secondary | ICD-10-CM | POA: Diagnosis not present

## 2019-01-25 DIAGNOSIS — G8929 Other chronic pain: Secondary | ICD-10-CM | POA: Diagnosis not present

## 2019-01-26 DIAGNOSIS — G8929 Other chronic pain: Secondary | ICD-10-CM | POA: Diagnosis not present

## 2019-01-27 DIAGNOSIS — G8929 Other chronic pain: Secondary | ICD-10-CM | POA: Diagnosis not present

## 2019-01-28 DIAGNOSIS — G8929 Other chronic pain: Secondary | ICD-10-CM | POA: Diagnosis not present

## 2019-01-29 DIAGNOSIS — G8929 Other chronic pain: Secondary | ICD-10-CM | POA: Diagnosis not present

## 2019-01-31 DIAGNOSIS — G8929 Other chronic pain: Secondary | ICD-10-CM | POA: Diagnosis not present

## 2019-02-01 DIAGNOSIS — G8929 Other chronic pain: Secondary | ICD-10-CM | POA: Diagnosis not present

## 2019-02-02 DIAGNOSIS — G8929 Other chronic pain: Secondary | ICD-10-CM | POA: Diagnosis not present

## 2019-02-03 DIAGNOSIS — G8929 Other chronic pain: Secondary | ICD-10-CM | POA: Diagnosis not present

## 2019-02-04 DIAGNOSIS — G8929 Other chronic pain: Secondary | ICD-10-CM | POA: Diagnosis not present

## 2019-02-05 DIAGNOSIS — G8929 Other chronic pain: Secondary | ICD-10-CM | POA: Diagnosis not present

## 2019-02-06 DIAGNOSIS — G8929 Other chronic pain: Secondary | ICD-10-CM | POA: Diagnosis not present

## 2019-02-07 DIAGNOSIS — G8929 Other chronic pain: Secondary | ICD-10-CM | POA: Diagnosis not present

## 2019-02-08 DIAGNOSIS — G8929 Other chronic pain: Secondary | ICD-10-CM | POA: Diagnosis not present

## 2019-02-09 DIAGNOSIS — R32 Unspecified urinary incontinence: Secondary | ICD-10-CM | POA: Diagnosis not present

## 2019-02-09 DIAGNOSIS — I5020 Unspecified systolic (congestive) heart failure: Secondary | ICD-10-CM | POA: Diagnosis not present

## 2019-02-09 DIAGNOSIS — G8929 Other chronic pain: Secondary | ICD-10-CM | POA: Diagnosis not present

## 2019-02-09 DIAGNOSIS — R0602 Shortness of breath: Secondary | ICD-10-CM | POA: Diagnosis not present

## 2019-02-09 DIAGNOSIS — M6281 Muscle weakness (generalized): Secondary | ICD-10-CM | POA: Diagnosis not present

## 2019-02-10 DIAGNOSIS — G8929 Other chronic pain: Secondary | ICD-10-CM | POA: Diagnosis not present

## 2019-02-11 DIAGNOSIS — G8929 Other chronic pain: Secondary | ICD-10-CM | POA: Diagnosis not present

## 2019-02-12 DIAGNOSIS — G8929 Other chronic pain: Secondary | ICD-10-CM | POA: Diagnosis not present

## 2019-02-13 DIAGNOSIS — G8929 Other chronic pain: Secondary | ICD-10-CM | POA: Diagnosis not present

## 2019-02-14 DIAGNOSIS — G8929 Other chronic pain: Secondary | ICD-10-CM | POA: Diagnosis not present

## 2019-02-15 DIAGNOSIS — G8929 Other chronic pain: Secondary | ICD-10-CM | POA: Diagnosis not present

## 2019-02-15 DIAGNOSIS — I5020 Unspecified systolic (congestive) heart failure: Secondary | ICD-10-CM | POA: Diagnosis not present

## 2019-02-15 DIAGNOSIS — R0602 Shortness of breath: Secondary | ICD-10-CM | POA: Diagnosis not present

## 2019-02-15 DIAGNOSIS — M6281 Muscle weakness (generalized): Secondary | ICD-10-CM | POA: Diagnosis not present

## 2019-02-15 DIAGNOSIS — R32 Unspecified urinary incontinence: Secondary | ICD-10-CM | POA: Diagnosis not present

## 2019-02-16 DIAGNOSIS — G8929 Other chronic pain: Secondary | ICD-10-CM | POA: Diagnosis not present

## 2019-02-17 DIAGNOSIS — G8929 Other chronic pain: Secondary | ICD-10-CM | POA: Diagnosis not present

## 2019-02-18 DIAGNOSIS — G8929 Other chronic pain: Secondary | ICD-10-CM | POA: Diagnosis not present

## 2019-02-19 DIAGNOSIS — G8929 Other chronic pain: Secondary | ICD-10-CM | POA: Diagnosis not present

## 2019-02-20 DIAGNOSIS — M6281 Muscle weakness (generalized): Secondary | ICD-10-CM | POA: Diagnosis not present

## 2019-02-20 DIAGNOSIS — I5020 Unspecified systolic (congestive) heart failure: Secondary | ICD-10-CM | POA: Diagnosis not present

## 2019-02-20 DIAGNOSIS — R062 Wheezing: Secondary | ICD-10-CM | POA: Diagnosis not present

## 2019-02-20 DIAGNOSIS — G8929 Other chronic pain: Secondary | ICD-10-CM | POA: Diagnosis not present

## 2019-02-21 DIAGNOSIS — G8929 Other chronic pain: Secondary | ICD-10-CM | POA: Diagnosis not present

## 2019-02-22 DIAGNOSIS — G8929 Other chronic pain: Secondary | ICD-10-CM | POA: Diagnosis not present

## 2019-02-23 DIAGNOSIS — G8929 Other chronic pain: Secondary | ICD-10-CM | POA: Diagnosis not present

## 2019-02-24 DIAGNOSIS — G8929 Other chronic pain: Secondary | ICD-10-CM | POA: Diagnosis not present

## 2019-02-25 DIAGNOSIS — G8929 Other chronic pain: Secondary | ICD-10-CM | POA: Diagnosis not present

## 2019-02-27 DIAGNOSIS — G8929 Other chronic pain: Secondary | ICD-10-CM | POA: Diagnosis not present

## 2019-02-28 DIAGNOSIS — G8929 Other chronic pain: Secondary | ICD-10-CM | POA: Diagnosis not present

## 2019-03-01 DIAGNOSIS — G8929 Other chronic pain: Secondary | ICD-10-CM | POA: Diagnosis not present

## 2019-03-02 ENCOUNTER — Ambulatory Visit: Payer: Medicaid Other | Admitting: Internal Medicine

## 2019-03-02 DIAGNOSIS — G8929 Other chronic pain: Secondary | ICD-10-CM | POA: Diagnosis not present

## 2019-03-03 DIAGNOSIS — G8929 Other chronic pain: Secondary | ICD-10-CM | POA: Diagnosis not present

## 2019-03-04 DIAGNOSIS — G8929 Other chronic pain: Secondary | ICD-10-CM | POA: Diagnosis not present

## 2019-03-05 DIAGNOSIS — G8929 Other chronic pain: Secondary | ICD-10-CM | POA: Diagnosis not present

## 2019-03-06 DIAGNOSIS — G8929 Other chronic pain: Secondary | ICD-10-CM | POA: Diagnosis not present

## 2019-03-07 DIAGNOSIS — G8929 Other chronic pain: Secondary | ICD-10-CM | POA: Diagnosis not present

## 2019-03-08 DIAGNOSIS — G8929 Other chronic pain: Secondary | ICD-10-CM | POA: Diagnosis not present

## 2019-03-09 DIAGNOSIS — G8929 Other chronic pain: Secondary | ICD-10-CM | POA: Diagnosis not present

## 2019-03-10 DIAGNOSIS — G8929 Other chronic pain: Secondary | ICD-10-CM | POA: Diagnosis not present

## 2019-03-11 DIAGNOSIS — G8929 Other chronic pain: Secondary | ICD-10-CM | POA: Diagnosis not present

## 2019-03-12 DIAGNOSIS — G8929 Other chronic pain: Secondary | ICD-10-CM | POA: Diagnosis not present

## 2019-03-13 DIAGNOSIS — G8929 Other chronic pain: Secondary | ICD-10-CM | POA: Diagnosis not present

## 2019-03-14 DIAGNOSIS — G8929 Other chronic pain: Secondary | ICD-10-CM | POA: Diagnosis not present

## 2019-03-15 DIAGNOSIS — G8929 Other chronic pain: Secondary | ICD-10-CM | POA: Diagnosis not present

## 2019-03-16 DIAGNOSIS — G8929 Other chronic pain: Secondary | ICD-10-CM | POA: Diagnosis not present

## 2019-03-16 DIAGNOSIS — M6281 Muscle weakness (generalized): Secondary | ICD-10-CM | POA: Diagnosis not present

## 2019-03-16 DIAGNOSIS — R062 Wheezing: Secondary | ICD-10-CM | POA: Diagnosis not present

## 2019-03-16 DIAGNOSIS — R32 Unspecified urinary incontinence: Secondary | ICD-10-CM | POA: Diagnosis not present

## 2019-03-16 DIAGNOSIS — I5020 Unspecified systolic (congestive) heart failure: Secondary | ICD-10-CM | POA: Diagnosis not present

## 2019-03-17 DIAGNOSIS — G8929 Other chronic pain: Secondary | ICD-10-CM | POA: Diagnosis not present

## 2019-03-18 DIAGNOSIS — G8929 Other chronic pain: Secondary | ICD-10-CM | POA: Diagnosis not present

## 2019-03-19 DIAGNOSIS — G8929 Other chronic pain: Secondary | ICD-10-CM | POA: Diagnosis not present

## 2019-03-20 DIAGNOSIS — G8929 Other chronic pain: Secondary | ICD-10-CM | POA: Diagnosis not present

## 2019-03-21 DIAGNOSIS — G8929 Other chronic pain: Secondary | ICD-10-CM | POA: Diagnosis not present

## 2019-03-22 DIAGNOSIS — G8929 Other chronic pain: Secondary | ICD-10-CM | POA: Diagnosis not present

## 2019-03-23 ENCOUNTER — Telehealth: Payer: Self-pay

## 2019-03-23 DIAGNOSIS — G8929 Other chronic pain: Secondary | ICD-10-CM | POA: Diagnosis not present

## 2019-03-23 DIAGNOSIS — M6281 Muscle weakness (generalized): Secondary | ICD-10-CM | POA: Diagnosis not present

## 2019-03-23 DIAGNOSIS — R062 Wheezing: Secondary | ICD-10-CM | POA: Diagnosis not present

## 2019-03-23 DIAGNOSIS — I5020 Unspecified systolic (congestive) heart failure: Secondary | ICD-10-CM | POA: Diagnosis not present

## 2019-03-23 NOTE — Telephone Encounter (Signed)
Call to patient to f/u on sx she reported to CMA during scheduling call.   Pt reports for the past 3 weeks she has been having random intermittent substernal pain that lasts for a "few hours". She has no pain at this time. It has woken her from sleep on several occasions. No diaphoresis, SOB or any other sx. Pain is non reproducible with activity. Last incident was 2 days ago after eating and was 9/10. She has hx of GERD and has been out of prescribed medication d/t unavailable from pharmacy. She is taking tums which she says helps.   She does not have access to BP device and has no vitals signs to share.   Upcoming tele visit with Eula Listen, PA 4/13 @ 10:30.  Call routed to provider to advise.

## 2019-03-23 NOTE — Telephone Encounter (Signed)
Spoke with patient to change 04/04/2019 face to face visit to an e-visit. Patient stated she would not be have been able to come to that appointment anyway because her sister has to work and she did not have any other transportation. Patient states she has been having intermittent midsternal chest pain over the last several weeks. Patient states symptoms sometimes occur after eating. Patient has a hx of GERD but is not currently taking any medication for this because the medication she was taking was "recalled". Please advise.

## 2019-03-23 NOTE — Telephone Encounter (Signed)
Sounds like she is experiencing worsening reflux given she has been without Zantac. She can start Pepcid 40 mg daily.

## 2019-03-23 NOTE — Telephone Encounter (Signed)
Spoke with the pt and made her aware of Ryan's recommendation below. Adv th pt that Pepcid can be purchased OTC.  Pt verbalized understanding and voiced appreciation for the call.

## 2019-03-23 NOTE — Telephone Encounter (Signed)
Virtual Visit Pre-Appointment Phone Call  Steps For Call:  1. Confirm consent - "In the setting of the current Covid19 crisis, you are scheduled for a phone visit with your provider on 04/04/2019 at 9:30AM.  Just as we do with many in-office visits, in order for you to participate in this visit, we must obtain consent.  If you'd like, I can send this to your mychart (if signed up) or email for you to review.  Otherwise, I can obtain your verbal consent now.  All virtual visits are billed to your insurance company just like a normal visit would be.  By agreeing to a virtual visit, we'd like you to understand that the technology does not allow for your provider to perform an examination, and thus may limit your provider's ability to fully assess your condition.  Finally, though the technology is pretty good, we cannot assure that it will always work on either your or our end, and in the setting of a video visit, we may have to convert it to a phone-only visit.  In either situation, we cannot ensure that we have a secure connection.  Are you willing to proceed?"  2. Give patient instructions for WebEx download to smartphone as below if video visit  3. Advise patient to be prepared with any vital sign or heart rhythm information, their current medicines, and a piece of paper and pen handy for any instructions they may receive the day of their visit  4. Inform patient they will receive a phone call 15 minutes prior to their appointment time (may be from unknown caller ID) so they should be prepared to answer  5. Confirm that appointment type is correct in Epic appointment notes (video vs telephone)    TELEPHONE CALL NOTE  [name] has been deemed a candidate for a follow-up tele-health visit to limit community exposure during the Covid-19 pandemic. I spoke with the patient via phone to ensure availability of phone/video source, confirm preferred email & phone number, and discuss instructions  and expectations.  I reminded [name] to be prepared with any vital sign and/or heart rhythm information that could potentially be obtained via home monitoring, at the time of her visit. I reminded [name] to expect a phone call at the time of her visit if her visit.  Did the patient verbally acknowledge consent to treatment? YES  [name] 03/23/2019 9:11 AM   DOWNLOADING THE WEBEX SOFTWARE TO SMARTPHONE  - If Apple, go to [employer] and type in WebEx in the search bar. Download [employer], the blue/green circle. The app is free but as with any other app downloads, their phone may require them to verify saved payment information or Apple password. The patient does NOT have to create an account.  - If Android, ask patient to go to [employer] and type in WebEx in the search bar. Download [employer], the blue/green circle. The app is free but as with any other app downloads, their phone may require them to verify saved payment information or Android password. The patient does NOT have to create an account.   CONSENT FOR TELE-HEALTH VISIT - PLEASE REVIEW  I hereby voluntarily request, consent and authorize [facility] and its employed or contracted physicians, physician assistants, nurse practitioners or other licensed health care professionals (the Practitioner), to provide me with telemedicine health care services (the "Services") as deemed necessary by the treating Practitioner. I acknowledge  and consent to receive the Services by the Practitioner via telemedicine. I understand that the telemedicine visit will involve communicating with the Practitioner through live audiovisual communication technology and the disclosure of certain medical information by electronic transmission. I acknowledge that I have been given the opportunity to request an in-person assessment or other available alternative prior to the telemedicine visit and am  voluntarily participating in the telemedicine visit.  I understand that I have the right to withhold or withdraw my consent to the use of telemedicine in the course of my care at any time, without affecting my right to future care or treatment, and that the Practitioner or I may terminate the telemedicine visit at any time. I understand that I have the right to inspect all information obtained and/or recorded in the course of the telemedicine visit and may receive copies of available information for a reasonable fee.  I understand that some of the potential risks of receiving the Services via telemedicine include:  [name] Delay or interruption in medical evaluation due to technological equipment failure or disruption; . Information transmitted may not be sufficient (e.g. poor resolution of images) to allow for appropriate medical decision making by the Practitioner; and/or  . In rare instances, security protocols could fail, causing a breach of personal health information.  Furthermore, I acknowledge that it is my responsibility to provide information about my medical history, conditions and care that is complete and accurate to the best of my ability. I acknowledge that Practitioner's advice, recommendations, and/or decision may be based on factors not within their control, such as incomplete or inaccurate data provided by me or distortions of diagnostic images or specimens that may result from electronic transmissions. I understand that the practice of medicine is not an exact science and that Practitioner makes no warranties or guarantees regarding treatment outcomes. I acknowledge that I will receive a copy of this consent concurrently upon execution via email to the email address I last provided but may also request a printed copy by calling the office of [place].    I understand that my insurance will be billed for this visit.   I have read or had this consent read to me. . I understand the  contents of this consent, which adequately explains the benefits and risks of the Services being provided via telemedicine.  . I have been provided ample opportunity to ask questions regarding this consent and the Services and have had my questions answered to my satisfaction. . I give my informed consent for the services to be provided through the use of telemedicine in my medical care  By participating in this telemedicine visit I agree to the above.

## 2019-03-24 DIAGNOSIS — G8929 Other chronic pain: Secondary | ICD-10-CM | POA: Diagnosis not present

## 2019-03-25 DIAGNOSIS — G8929 Other chronic pain: Secondary | ICD-10-CM | POA: Diagnosis not present

## 2019-03-26 DIAGNOSIS — G8929 Other chronic pain: Secondary | ICD-10-CM | POA: Diagnosis not present

## 2019-03-27 DIAGNOSIS — G8929 Other chronic pain: Secondary | ICD-10-CM | POA: Diagnosis not present

## 2019-03-28 DIAGNOSIS — G8929 Other chronic pain: Secondary | ICD-10-CM | POA: Diagnosis not present

## 2019-03-29 DIAGNOSIS — G8929 Other chronic pain: Secondary | ICD-10-CM | POA: Diagnosis not present

## 2019-03-30 DIAGNOSIS — G8929 Other chronic pain: Secondary | ICD-10-CM | POA: Diagnosis not present

## 2019-03-31 DIAGNOSIS — G8929 Other chronic pain: Secondary | ICD-10-CM | POA: Diagnosis not present

## 2019-04-01 DIAGNOSIS — G8929 Other chronic pain: Secondary | ICD-10-CM | POA: Diagnosis not present

## 2019-04-03 DIAGNOSIS — G8929 Other chronic pain: Secondary | ICD-10-CM | POA: Diagnosis not present

## 2019-04-03 NOTE — Progress Notes (Signed)
Virtual Visit via Telephone Note   This visit type was conducted due to national recommendations for restrictions regarding the COVID-19 Pandemic (e.g. social distancing) in an effort to limit this patient's exposure and mitigate transmission in our community.  Due to her co-morbid illnesses, this patient is at least at moderate risk for complications without adequate follow up.  This format is felt to be most appropriate for this patient at this time.  The patient did not have access to video technology/had technical difficulties with video requiring transitioning to audio format only (telephone).  All issues noted in this document were discussed and addressed.  No physical exam could be performed with this format.  Please refer to the patient's chart for her  consent to telehealth for [facility].   Evaluation Performed:  Follow-up visit  Date:  04/04/2019   ID:  [name]. Bailey, [name] [birth year], MRN [id number]  Patient Location: Home  Provider Location: Home  PCP:  [facility]  Cardiologist:  Yvonne Kendall, MD  Electrophysiologist:  None   Chief Complaint:  Telehealth follow up  History of Present Illness:    [name] Huerta. Neller is a 58 y.o. female who presents via audio/video conferencing for a telehealth visit today.  She has a history of HFpEF, lymphedema, morbid obesity, OSA with likely OHS on CPAP, HTN, depression and GERD.   She was previously followed by Dr. Alvino Chapel, though more recently has established with Dr. Okey Dupre and the [facility]. Prior echo from 2017 showed an EF of 60-65% with normal RV size and systolic function. Challenging image quality secondary to body habitus and patient in a wheelchair. Most recent echo from 02/2018 showed technically challenging images secondary to body habitus with normal LV size and moderate LVH with an EF of 55-60%. She was most recently seen by our office in 06/2018 with noted dietary indiscretion, which she attributed to the  recently loss of her ex-husband.  Her lisinopril was changed to losartan given an intermittent cough and possible angioedema. Since then, she has been followed by the [facility], most recently being seen in 11/2018 without changes made.   Labs: 10/2018 - SCr 0.69, K+ 4.5, HGB 12.8, LDL 85  Patient is doing well from a cardiac perspective.  No chest pain, shortness of breath or dizziness, presyncope, or syncope.  Her weight is down 14 pounds when compared to her last office visit with [name].  She remains on Lasix 80 mg daily and metolazone 2.5 mg twice weekly.  She continues to work on her diet and is trying to only eat fried foods once per week.  She is trying to increase her vegetable intake.  She has cut out adding salt to her food and is now using Mrs. Dash, paprika, and onion powder.  She is drinking less than 2 L of fluid per day.  She remains on her baseline 2 L of supplemental oxygen.  She is compliant with her CPAP though does need a new mask.  Since transitioning from lisinopril to losartan, cough has resolved.  She does have a BP cuff at home though needs batteries for it.  She does not have any concerns at this time.  The patient does not have symptoms concerning for COVID-19 infection (fever, chills, cough, or new shortness of breath).    Past Medical History:  Diagnosis Date  . Asthma 01/25/2018  . CHF (congestive heart failure) (HCC)   . Depression   . GERD (gastroesophageal reflux disease)   .  Hypertension   . Lymphedema   . Morbid obesity (HCC)   . Sleep apnea    No past surgical history on file.   Current Meds  Medication Sig  . albuterol (PROVENTIL HFA;VENTOLIN HFA) 108 (90 Base) MCG/ACT inhaler Inhale 2 puffs into the lungs every 6 (six) hours as needed for wheezing or shortness of breath.  Marland Kitchen aspirin EC 81 MG tablet Take 1 tablet (81 mg total) by mouth daily.  Marland Kitchen atenolol (TENORMIN) 50 MG tablet Take 1 tablet (50 mg total) by mouth 2 (two) times daily.  .  Cholecalciferol 250 MCG (10000 UT) CAPS Take 10,000 Units by mouth once a week.  . Cyanocobalamin 1000 MCG CAPS Take by mouth daily.  . diclofenac sodium (VOLTAREN) 1 % GEL Apply 2 g topically 2 (two) times daily as needed.  . Dimethicone-Zinc Oxide (SOOTHE & COOL INZO BARRIER) 5-5 % CREA Apply 1 application topically as needed.  . furosemide (LASIX) 80 MG tablet Take 1 tablet (80 mg total) by mouth daily.  Marland Kitchen losartan (COZAAR) 100 MG tablet TAKE 1 TABLET BY MOUTH EVERY DAY  . metolazone (ZAROXOLYN) 2.5 MG tablet Take 1 tablet (2.5 mg total) by mouth 2 (two) times a week.  . Zinc Oxide (DR SMITHS DIAPER RASH EX) Apply topically.     Allergies:   Patient has no known allergies.   Social History   Tobacco Use  . Smoking status: Former Smoker    Years: 10.00    Types: Cigarettes  . Smokeless tobacco: Never Used  . Tobacco comment: quit 6 years ago  Substance Use Topics  . Alcohol use: Yes    Alcohol/week: 1.0 standard drinks    Types: 1 Standard drinks or equivalent per week    Comment: occasional alcohol use on weekend  . Drug use: No     Family Hx: The patient's family history includes CVA in her father; Hypertension in her father and mother.  ROS:   Please see the history of present illness.     All other systems reviewed and are negative.   Prior CV studies:   The following studies were reviewed today:  2D Echo 02/2018: - Procedure narrative: Transthoracic echocardiography. Image   quality was suboptimal. The study was technically difficult, as a   result of body habitus. Only parasternal images. - Left ventricle: The cavity size was normal. There was moderate   concentric hypertrophy. Systolic function was normal. The   estimated ejection fraction was in the range of 55% to 60%. The   study is not technically sufficient to allow evaluation of LV   diastolic function. - Pulmonary arteries: Systolic pressure could not be accurately   estimated.  Labs/Other Tests and  Data Reviewed:    EKG:  No ECG reviewed.  Recent Labs: 07/06/2018: BUN 27; Creatinine, Ser 0.98; Potassium 4.1; Sodium 141   Recent Lipid Panel Lab Results  Component Value Date/Time   CHOL 151 07/22/2017 08:56 AM   TRIG 106 07/22/2017 08:56 AM   HDL 52 07/22/2017 08:56 AM   CHOLHDL 2.9 07/22/2017 08:56 AM   LDLCALC 78 07/22/2017 08:56 AM    Wt Readings from Last 3 Encounters:  04/04/19 (!) 440 lb (199.6 kg)  11/24/18 (!) 454 lb 2 oz (206 kg)  06/23/18 (!) 454 lb (205.9 kg)     Objective:    Vital Signs:  Ht  (1.575 m)   Wt (!) 440 lb (199.6 kg)   BMI 80.48 kg/m    Well nourished,  well developed female in no acute distress.   ASSESSMENT & PLAN:    1. HFpEF: Volume assessment is difficult to obtain over the phone though her weight is down 14 pounds when compared to last office visit.  She attributes this to improved dietary choices and limiting her sodium intake.  She will remain on Lasix 80 mg daily and metolazone 2.5 mg twice a week.  CHF education provided.  2. Hypertension: She does not have any recent BP readings as her BP cuff needs new batteries.  She will obtain these and call us with her readings.  Cough has resolved with transition from lisinopril to losartan.  Follow-up BMP in 10/2018 showed a stable renal function and potassium.  She will otherwise remain on atenolol.  3. Atypical chest pain: No further issues.  No further work-up at this time.  4. Morbid obesity: Weight loss is advised.  COVID-19 Education: The signs and symptoms of COVID-19 were discussed with the patient and how to seek care for testing (follow up with PCP or arrange E-visit).  The importance of social distancing was discussed today.  Time:   Today, I have spent 11 minutes with the patient with telehealth technology discussing the above problems.     Medication Adjustments/Labs and Tests Ordered: Current medicines are reviewed at length with the patient today.  Concerns regarding  medicines are outlined above.  Tests Ordered: No orders of the defined types were placed in this encounter.  Medication Changes: No orders of the defined types were placed in this encounter.   Disposition:  Follow up in 6 month(s)  Signed, Eula Listenyan Jidenna Figgs, PA-C  04/04/2019 9:29 AM    [facility]

## 2019-04-04 ENCOUNTER — Other Ambulatory Visit: Payer: Self-pay

## 2019-04-04 ENCOUNTER — Encounter: Payer: Self-pay | Admitting: Physician Assistant

## 2019-04-04 ENCOUNTER — Telehealth (INDEPENDENT_AMBULATORY_CARE_PROVIDER_SITE_OTHER): Payer: Medicaid Other | Admitting: Physician Assistant

## 2019-04-04 VITALS — Ht 62.0 in | Wt >= 6400 oz

## 2019-04-04 DIAGNOSIS — R0789 Other chest pain: Secondary | ICD-10-CM

## 2019-04-04 DIAGNOSIS — I5032 Chronic diastolic (congestive) heart failure: Secondary | ICD-10-CM | POA: Diagnosis not present

## 2019-04-04 DIAGNOSIS — I10 Essential (primary) hypertension: Secondary | ICD-10-CM

## 2019-04-04 DIAGNOSIS — G8929 Other chronic pain: Secondary | ICD-10-CM | POA: Diagnosis not present

## 2019-04-04 NOTE — Patient Instructions (Signed)
It was a pleasure to speak with you on the phone today! Thank you for allowing [name] to continue taking care of your [facility] needs during this time.   Feel free to call as needed for questions and concerns related to your cardiac needs.    Medication Instructions:  Your physician recommends that you continue on your current medications as directed. Please refer to the Current Medication list given to you today.  If you need a refill on your cardiac medications before your next appointment, please call your pharmacy.   Lab work: None ordered  If you have labs (blood work) drawn today and your tests are completely normal, you will receive your results only by: Marland Kitchen MyChart Message (if you have MyChart) OR . A paper copy in the mail If you have any lab test that is abnormal or we need to change your treatment, we will call you to review the results.  Testing/Procedures: None ordered   Follow-Up: At [facility], you and your health needs are our priority.  As part of our continuing mission to provide you with exceptional heart care, we have created designated Provider Care Teams.  These Care Teams include your primary Cardiologist (physician) and Advanced Practice Providers (APPs -  Physician Assistants and Nurse Practitioners) who all work together to provide you with the care you need, when you need it. You will need a follow up appointment in 6 months.  Please call our office 2 months in advance to schedule this appointment.  You may see Yvonne Kendall, MD or one of the following Advanced Practice Providers on your designated Care Team:   Eula Listen, [place].

## 2019-04-05 DIAGNOSIS — G8929 Other chronic pain: Secondary | ICD-10-CM | POA: Diagnosis not present

## 2019-04-06 DIAGNOSIS — G8929 Other chronic pain: Secondary | ICD-10-CM | POA: Diagnosis not present

## 2019-04-07 DIAGNOSIS — G8929 Other chronic pain: Secondary | ICD-10-CM | POA: Diagnosis not present

## 2019-04-08 DIAGNOSIS — G8929 Other chronic pain: Secondary | ICD-10-CM | POA: Diagnosis not present

## 2019-04-09 DIAGNOSIS — G8929 Other chronic pain: Secondary | ICD-10-CM | POA: Diagnosis not present

## 2019-04-10 DIAGNOSIS — G8929 Other chronic pain: Secondary | ICD-10-CM | POA: Diagnosis not present

## 2019-04-11 DIAGNOSIS — G8929 Other chronic pain: Secondary | ICD-10-CM | POA: Diagnosis not present

## 2019-04-12 DIAGNOSIS — G8929 Other chronic pain: Secondary | ICD-10-CM | POA: Diagnosis not present

## 2019-04-13 DIAGNOSIS — G8929 Other chronic pain: Secondary | ICD-10-CM | POA: Diagnosis not present

## 2019-04-13 DIAGNOSIS — R062 Wheezing: Secondary | ICD-10-CM | POA: Diagnosis not present

## 2019-04-13 DIAGNOSIS — I5020 Unspecified systolic (congestive) heart failure: Secondary | ICD-10-CM | POA: Diagnosis not present

## 2019-04-13 DIAGNOSIS — M6281 Muscle weakness (generalized): Secondary | ICD-10-CM | POA: Diagnosis not present

## 2019-04-13 DIAGNOSIS — R32 Unspecified urinary incontinence: Secondary | ICD-10-CM | POA: Diagnosis not present

## 2019-04-14 DIAGNOSIS — G8929 Other chronic pain: Secondary | ICD-10-CM | POA: Diagnosis not present

## 2019-04-15 DIAGNOSIS — G8929 Other chronic pain: Secondary | ICD-10-CM | POA: Diagnosis not present

## 2019-04-16 DIAGNOSIS — G8929 Other chronic pain: Secondary | ICD-10-CM | POA: Diagnosis not present

## 2019-04-17 DIAGNOSIS — G8929 Other chronic pain: Secondary | ICD-10-CM | POA: Diagnosis not present

## 2019-04-18 DIAGNOSIS — G8929 Other chronic pain: Secondary | ICD-10-CM | POA: Diagnosis not present

## 2019-04-19 DIAGNOSIS — G8929 Other chronic pain: Secondary | ICD-10-CM | POA: Diagnosis not present

## 2019-04-20 DIAGNOSIS — G8929 Other chronic pain: Secondary | ICD-10-CM | POA: Diagnosis not present

## 2019-04-21 DIAGNOSIS — G8929 Other chronic pain: Secondary | ICD-10-CM | POA: Diagnosis not present

## 2019-04-22 DIAGNOSIS — G8929 Other chronic pain: Secondary | ICD-10-CM | POA: Diagnosis not present

## 2019-04-22 DIAGNOSIS — M6281 Muscle weakness (generalized): Secondary | ICD-10-CM | POA: Diagnosis not present

## 2019-04-22 DIAGNOSIS — I5020 Unspecified systolic (congestive) heart failure: Secondary | ICD-10-CM | POA: Diagnosis not present

## 2019-04-22 DIAGNOSIS — R062 Wheezing: Secondary | ICD-10-CM | POA: Diagnosis not present

## 2019-04-23 DIAGNOSIS — G8929 Other chronic pain: Secondary | ICD-10-CM | POA: Diagnosis not present

## 2019-04-24 DIAGNOSIS — G8929 Other chronic pain: Secondary | ICD-10-CM | POA: Diagnosis not present

## 2019-04-25 DIAGNOSIS — G8929 Other chronic pain: Secondary | ICD-10-CM | POA: Diagnosis not present

## 2019-04-26 DIAGNOSIS — G8929 Other chronic pain: Secondary | ICD-10-CM | POA: Diagnosis not present

## 2019-04-27 DIAGNOSIS — G8929 Other chronic pain: Secondary | ICD-10-CM | POA: Diagnosis not present

## 2019-04-28 DIAGNOSIS — G8929 Other chronic pain: Secondary | ICD-10-CM | POA: Diagnosis not present

## 2019-04-29 DIAGNOSIS — G8929 Other chronic pain: Secondary | ICD-10-CM | POA: Diagnosis not present

## 2019-04-30 DIAGNOSIS — G8929 Other chronic pain: Secondary | ICD-10-CM | POA: Diagnosis not present

## 2019-05-01 DIAGNOSIS — G8929 Other chronic pain: Secondary | ICD-10-CM | POA: Diagnosis not present

## 2019-05-02 DIAGNOSIS — G8929 Other chronic pain: Secondary | ICD-10-CM | POA: Diagnosis not present

## 2019-05-03 DIAGNOSIS — G8929 Other chronic pain: Secondary | ICD-10-CM | POA: Diagnosis not present

## 2019-05-04 DIAGNOSIS — G8929 Other chronic pain: Secondary | ICD-10-CM | POA: Diagnosis not present

## 2019-05-05 DIAGNOSIS — G8929 Other chronic pain: Secondary | ICD-10-CM | POA: Diagnosis not present

## 2019-05-06 DIAGNOSIS — G8929 Other chronic pain: Secondary | ICD-10-CM | POA: Diagnosis not present

## 2019-05-08 DIAGNOSIS — G8929 Other chronic pain: Secondary | ICD-10-CM | POA: Diagnosis not present

## 2019-05-09 DIAGNOSIS — G8929 Other chronic pain: Secondary | ICD-10-CM | POA: Diagnosis not present

## 2019-05-10 DIAGNOSIS — G8929 Other chronic pain: Secondary | ICD-10-CM | POA: Diagnosis not present

## 2019-05-11 DIAGNOSIS — G8929 Other chronic pain: Secondary | ICD-10-CM | POA: Diagnosis not present

## 2019-05-12 DIAGNOSIS — G8929 Other chronic pain: Secondary | ICD-10-CM | POA: Diagnosis not present

## 2019-05-13 DIAGNOSIS — G8929 Other chronic pain: Secondary | ICD-10-CM | POA: Diagnosis not present

## 2019-05-14 DIAGNOSIS — G8929 Other chronic pain: Secondary | ICD-10-CM | POA: Diagnosis not present

## 2019-05-16 DIAGNOSIS — G8929 Other chronic pain: Secondary | ICD-10-CM | POA: Diagnosis not present

## 2019-05-17 DIAGNOSIS — G8929 Other chronic pain: Secondary | ICD-10-CM | POA: Diagnosis not present

## 2019-05-18 DIAGNOSIS — G8929 Other chronic pain: Secondary | ICD-10-CM | POA: Diagnosis not present

## 2019-05-19 DIAGNOSIS — G8929 Other chronic pain: Secondary | ICD-10-CM | POA: Diagnosis not present

## 2019-05-20 DIAGNOSIS — G8929 Other chronic pain: Secondary | ICD-10-CM | POA: Diagnosis not present

## 2019-05-21 DIAGNOSIS — G8929 Other chronic pain: Secondary | ICD-10-CM | POA: Diagnosis not present

## 2019-05-22 DIAGNOSIS — G8929 Other chronic pain: Secondary | ICD-10-CM | POA: Diagnosis not present

## 2019-05-23 DIAGNOSIS — I5020 Unspecified systolic (congestive) heart failure: Secondary | ICD-10-CM | POA: Diagnosis not present

## 2019-05-23 DIAGNOSIS — M6281 Muscle weakness (generalized): Secondary | ICD-10-CM | POA: Diagnosis not present

## 2019-05-23 DIAGNOSIS — G8929 Other chronic pain: Secondary | ICD-10-CM | POA: Diagnosis not present

## 2019-05-23 DIAGNOSIS — R062 Wheezing: Secondary | ICD-10-CM | POA: Diagnosis not present

## 2019-05-24 DIAGNOSIS — G8929 Other chronic pain: Secondary | ICD-10-CM | POA: Diagnosis not present

## 2019-05-25 DIAGNOSIS — G8929 Other chronic pain: Secondary | ICD-10-CM | POA: Diagnosis not present

## 2019-05-26 DIAGNOSIS — G8929 Other chronic pain: Secondary | ICD-10-CM | POA: Diagnosis not present

## 2019-05-27 DIAGNOSIS — G8929 Other chronic pain: Secondary | ICD-10-CM | POA: Diagnosis not present

## 2019-05-27 DIAGNOSIS — I5020 Unspecified systolic (congestive) heart failure: Secondary | ICD-10-CM | POA: Diagnosis not present

## 2019-05-27 DIAGNOSIS — R062 Wheezing: Secondary | ICD-10-CM | POA: Diagnosis not present

## 2019-05-27 DIAGNOSIS — M6281 Muscle weakness (generalized): Secondary | ICD-10-CM | POA: Diagnosis not present

## 2019-05-27 DIAGNOSIS — R32 Unspecified urinary incontinence: Secondary | ICD-10-CM | POA: Diagnosis not present

## 2019-05-28 DIAGNOSIS — G8929 Other chronic pain: Secondary | ICD-10-CM | POA: Diagnosis not present

## 2019-05-30 DIAGNOSIS — G8929 Other chronic pain: Secondary | ICD-10-CM | POA: Diagnosis not present

## 2019-05-31 DIAGNOSIS — G8929 Other chronic pain: Secondary | ICD-10-CM | POA: Diagnosis not present

## 2019-06-01 DIAGNOSIS — G8929 Other chronic pain: Secondary | ICD-10-CM | POA: Diagnosis not present

## 2019-06-02 DIAGNOSIS — G8929 Other chronic pain: Secondary | ICD-10-CM | POA: Diagnosis not present

## 2019-06-03 DIAGNOSIS — G8929 Other chronic pain: Secondary | ICD-10-CM | POA: Diagnosis not present

## 2019-06-04 DIAGNOSIS — G8929 Other chronic pain: Secondary | ICD-10-CM | POA: Diagnosis not present

## 2019-06-05 DIAGNOSIS — G8929 Other chronic pain: Secondary | ICD-10-CM | POA: Diagnosis not present

## 2019-06-06 DIAGNOSIS — G8929 Other chronic pain: Secondary | ICD-10-CM | POA: Diagnosis not present

## 2019-06-07 DIAGNOSIS — G8929 Other chronic pain: Secondary | ICD-10-CM | POA: Diagnosis not present

## 2019-06-08 DIAGNOSIS — G8929 Other chronic pain: Secondary | ICD-10-CM | POA: Diagnosis not present

## 2019-06-09 DIAGNOSIS — G8929 Other chronic pain: Secondary | ICD-10-CM | POA: Diagnosis not present

## 2019-06-10 DIAGNOSIS — G8929 Other chronic pain: Secondary | ICD-10-CM | POA: Diagnosis not present

## 2019-06-11 DIAGNOSIS — G8929 Other chronic pain: Secondary | ICD-10-CM | POA: Diagnosis not present

## 2019-06-12 DIAGNOSIS — G8929 Other chronic pain: Secondary | ICD-10-CM | POA: Diagnosis not present

## 2019-06-13 DIAGNOSIS — G8929 Other chronic pain: Secondary | ICD-10-CM | POA: Diagnosis not present

## 2019-06-14 DIAGNOSIS — G8929 Other chronic pain: Secondary | ICD-10-CM | POA: Diagnosis not present

## 2019-06-16 ENCOUNTER — Other Ambulatory Visit: Payer: Self-pay | Admitting: Family

## 2019-06-16 DIAGNOSIS — G8929 Other chronic pain: Secondary | ICD-10-CM | POA: Diagnosis not present

## 2019-06-17 DIAGNOSIS — G8929 Other chronic pain: Secondary | ICD-10-CM | POA: Diagnosis not present

## 2019-06-18 DIAGNOSIS — G8929 Other chronic pain: Secondary | ICD-10-CM | POA: Diagnosis not present

## 2019-06-19 DIAGNOSIS — G8929 Other chronic pain: Secondary | ICD-10-CM | POA: Diagnosis not present

## 2019-06-20 DIAGNOSIS — G8929 Other chronic pain: Secondary | ICD-10-CM | POA: Diagnosis not present

## 2019-06-21 DIAGNOSIS — G8929 Other chronic pain: Secondary | ICD-10-CM | POA: Diagnosis not present

## 2019-06-22 DIAGNOSIS — R062 Wheezing: Secondary | ICD-10-CM | POA: Diagnosis not present

## 2019-06-22 DIAGNOSIS — G8929 Other chronic pain: Secondary | ICD-10-CM | POA: Diagnosis not present

## 2019-06-22 DIAGNOSIS — I5020 Unspecified systolic (congestive) heart failure: Secondary | ICD-10-CM | POA: Diagnosis not present

## 2019-06-22 DIAGNOSIS — M6281 Muscle weakness (generalized): Secondary | ICD-10-CM | POA: Diagnosis not present

## 2019-06-23 DIAGNOSIS — G8929 Other chronic pain: Secondary | ICD-10-CM | POA: Diagnosis not present

## 2019-06-24 DIAGNOSIS — G8929 Other chronic pain: Secondary | ICD-10-CM | POA: Diagnosis not present

## 2019-06-26 DIAGNOSIS — G8929 Other chronic pain: Secondary | ICD-10-CM | POA: Diagnosis not present

## 2019-06-27 DIAGNOSIS — G8929 Other chronic pain: Secondary | ICD-10-CM | POA: Diagnosis not present

## 2019-06-27 DIAGNOSIS — R32 Unspecified urinary incontinence: Secondary | ICD-10-CM | POA: Diagnosis not present

## 2019-06-27 DIAGNOSIS — R062 Wheezing: Secondary | ICD-10-CM | POA: Diagnosis not present

## 2019-06-27 DIAGNOSIS — M6281 Muscle weakness (generalized): Secondary | ICD-10-CM | POA: Diagnosis not present

## 2019-06-27 DIAGNOSIS — I5020 Unspecified systolic (congestive) heart failure: Secondary | ICD-10-CM | POA: Diagnosis not present

## 2019-06-28 DIAGNOSIS — G8929 Other chronic pain: Secondary | ICD-10-CM | POA: Diagnosis not present

## 2019-06-29 DIAGNOSIS — G8929 Other chronic pain: Secondary | ICD-10-CM | POA: Diagnosis not present

## 2019-06-30 DIAGNOSIS — G8929 Other chronic pain: Secondary | ICD-10-CM | POA: Diagnosis not present

## 2019-07-01 DIAGNOSIS — G8929 Other chronic pain: Secondary | ICD-10-CM | POA: Diagnosis not present

## 2019-07-02 DIAGNOSIS — G8929 Other chronic pain: Secondary | ICD-10-CM | POA: Diagnosis not present

## 2019-07-04 DIAGNOSIS — G8929 Other chronic pain: Secondary | ICD-10-CM | POA: Diagnosis not present

## 2019-07-05 DIAGNOSIS — G8929 Other chronic pain: Secondary | ICD-10-CM | POA: Diagnosis not present

## 2019-07-08 DIAGNOSIS — G8929 Other chronic pain: Secondary | ICD-10-CM | POA: Diagnosis not present

## 2019-07-08 NOTE — Progress Notes (Deleted)
Patient ID: [name]. [place], female    DOB: 08-20-1961, 58 y.o.   MRN: [id number]  HPI  [name] is a 58 y/o female with a history of obstructive sleep apnea, morbid obesity, lymphedema, HTN, GERD, depression and chronic heart failure.  Echo report from 03/11/18 reviewed and showed an EF of 55-60%. Echo report done 03/11/18 reviewed and showed an EF of 55-60%. Echo done 05/07/16 showed an EF of 60-65%. No valvular regurgitation. Technically difficult study however due to body habitus and patient was in a wheelchair.   Has not been admitted or been in the ED in the last 6 months.   She presents today for a follow-up visit with a chief complaint of   Past Medical History:  Diagnosis Date  . Asthma 01/25/2018  . CHF (congestive heart failure) ([place])   . Depression   . GERD (gastroesophageal reflux disease)   . Hypertension   . Lymphedema   . Morbid obesity ([place])   . Sleep apnea    No past surgical history on file.  Family History  Problem Relation Age of Onset  . CVA Father   . Hypertension Father   . Hypertension Mother    Social History   Tobacco Use  . Smoking status: Former Smoker    Years: 10.00    Types: Cigarettes  . Smokeless tobacco: Never Used  . Tobacco comment: quit 6 years ago  Substance Use Topics  . Alcohol use: Yes    Alcohol/week: 1.0 standard drinks    Types: 1 Standard drinks or equivalent per week    Comment: occasional alcohol use on weekend   No Known Allergies    Review of Systems  Constitutional: Positive for fatigue. Negative for appetite change.  HENT: Negative for congestion, postnasal drip and sore throat.   Eyes: Negative.   Respiratory: Positive for shortness of breath. Negative for cough, chest tightness and wheezing.   Cardiovascular: Positive for chest pain (last week) and leg swelling (minimal). Negative for palpitations.  Gastrointestinal: Negative for abdominal distention and abdominal pain.  Endocrine: Negative.   Genitourinary:  Negative.   Musculoskeletal: Positive for arthralgias (left hip) and back pain.  Skin: Negative.   Allergic/Immunologic: Negative.   Neurological: Negative for dizziness and light-headedness.  Hematological: Negative for adenopathy. Does not bruise/bleed easily.  Psychiatric/Behavioral: Negative for dysphoric mood, sleep disturbance (sleeping on 1 pillow; wears CPAP at bedtime ) and suicidal ideas. The patient is not nervous/anxious.      Physical Exam  Constitutional: She is oriented to person, place, and time. She appears well-developed and well-nourished.  HENT:  Head: Normocephalic and atraumatic.  Neck: Normal range of motion. Neck supple. No JVD present.  Cardiovascular: Normal rate and regular rhythm.  Pulmonary/Chest: Effort normal. She has no wheezes. She has no rales.  Abdominal: Soft. She exhibits no distension. There is no abdominal tenderness.  Musculoskeletal:        General: Edema (chronic lymphedema ) present. No tenderness.  Neurological: She is alert and oriented to person, place, and time.  Skin: Skin is warm and dry.  Psychiatric: She has a normal mood and affect. Her behavior is normal. Thought content normal.  Nursing note and vitals reviewed.  Assessment & Plan:  1: Chronic heart failure with preserved ejection fraction- - NYHA class III - difficult to assess fluid status due to patient's weight - weighing daily when her aide is there. Reminded to call for an overnight weight gain of >2 pounds or a weekly weight  gain of >5 pounds - weight 454.2 since she was last here 7 months ago - had telemedicine visit with cardiologist [name]([place]) 04/04/2019  - not adding salt to her food - drinks ~ 64 ounces of water daily   2: HTN- - BP  - saw PCP [name]([place]) 11/05/18 - BMP 11/05/18 reviewed and shows sodium 139, potassium 4.5, Cr 0.69 and GFR >90  3: Obstructive sleep apnea- - wears oxygen at 2L during the day as needed - wearing CPAP at bedtime - saw pulmonologist  [name]([place]) 08/12/18  4: Lymphedema- - Stage 3 lymphedema - unable to wear compression socks due to body habitus - hasn't been able to wear her compression boots due to waiting on a lift recliner to arrive  Medication bottles were reviewed.

## 2019-07-09 DIAGNOSIS — G8929 Other chronic pain: Secondary | ICD-10-CM | POA: Diagnosis not present

## 2019-07-10 DIAGNOSIS — G8929 Other chronic pain: Secondary | ICD-10-CM | POA: Diagnosis not present

## 2019-07-11 DIAGNOSIS — G8929 Other chronic pain: Secondary | ICD-10-CM | POA: Diagnosis not present

## 2019-07-12 ENCOUNTER — Ambulatory Visit: Payer: Medicaid Other | Admitting: Family

## 2019-07-12 DIAGNOSIS — G8929 Other chronic pain: Secondary | ICD-10-CM | POA: Diagnosis not present

## 2019-07-13 DIAGNOSIS — G8929 Other chronic pain: Secondary | ICD-10-CM | POA: Diagnosis not present

## 2019-07-14 DIAGNOSIS — G8929 Other chronic pain: Secondary | ICD-10-CM | POA: Diagnosis not present

## 2019-07-15 DIAGNOSIS — G8929 Other chronic pain: Secondary | ICD-10-CM | POA: Diagnosis not present

## 2019-07-16 DIAGNOSIS — G8929 Other chronic pain: Secondary | ICD-10-CM | POA: Diagnosis not present

## 2019-07-17 DIAGNOSIS — G8929 Other chronic pain: Secondary | ICD-10-CM | POA: Diagnosis not present

## 2019-07-18 DIAGNOSIS — G8929 Other chronic pain: Secondary | ICD-10-CM | POA: Diagnosis not present

## 2019-07-19 ENCOUNTER — Other Ambulatory Visit: Payer: Self-pay | Admitting: *Deleted

## 2019-07-19 DIAGNOSIS — G8929 Other chronic pain: Secondary | ICD-10-CM | POA: Diagnosis not present

## 2019-07-19 MED ORDER — LOSARTAN POTASSIUM 100 MG PO TABS: 100.0000 mg | ORAL_TABLET | Freq: Every day | ORAL | 3 refills | Status: DC

## 2019-07-20 DIAGNOSIS — G8929 Other chronic pain: Secondary | ICD-10-CM | POA: Diagnosis not present

## 2019-07-21 DIAGNOSIS — G8929 Other chronic pain: Secondary | ICD-10-CM | POA: Diagnosis not present

## 2019-07-22 DIAGNOSIS — G8929 Other chronic pain: Secondary | ICD-10-CM | POA: Diagnosis not present

## 2019-07-23 DIAGNOSIS — M6281 Muscle weakness (generalized): Secondary | ICD-10-CM | POA: Diagnosis not present

## 2019-07-23 DIAGNOSIS — G8929 Other chronic pain: Secondary | ICD-10-CM | POA: Diagnosis not present

## 2019-07-23 DIAGNOSIS — R062 Wheezing: Secondary | ICD-10-CM | POA: Diagnosis not present

## 2019-07-23 DIAGNOSIS — I5020 Unspecified systolic (congestive) heart failure: Secondary | ICD-10-CM | POA: Diagnosis not present

## 2019-07-24 DIAGNOSIS — G8929 Other chronic pain: Secondary | ICD-10-CM | POA: Diagnosis not present

## 2019-07-25 DIAGNOSIS — G8929 Other chronic pain: Secondary | ICD-10-CM | POA: Diagnosis not present

## 2019-07-26 DIAGNOSIS — G8929 Other chronic pain: Secondary | ICD-10-CM | POA: Diagnosis not present

## 2019-07-27 DIAGNOSIS — I5020 Unspecified systolic (congestive) heart failure: Secondary | ICD-10-CM | POA: Diagnosis not present

## 2019-07-27 DIAGNOSIS — G8929 Other chronic pain: Secondary | ICD-10-CM | POA: Diagnosis not present

## 2019-07-28 DIAGNOSIS — G8929 Other chronic pain: Secondary | ICD-10-CM | POA: Diagnosis not present

## 2019-07-29 DIAGNOSIS — G8929 Other chronic pain: Secondary | ICD-10-CM | POA: Diagnosis not present

## 2019-07-30 DIAGNOSIS — G8929 Other chronic pain: Secondary | ICD-10-CM | POA: Diagnosis not present

## 2019-07-31 DIAGNOSIS — G8929 Other chronic pain: Secondary | ICD-10-CM | POA: Diagnosis not present

## 2019-08-01 DIAGNOSIS — G8929 Other chronic pain: Secondary | ICD-10-CM | POA: Diagnosis not present

## 2019-08-02 DIAGNOSIS — G8929 Other chronic pain: Secondary | ICD-10-CM | POA: Diagnosis not present

## 2019-08-03 DIAGNOSIS — G8929 Other chronic pain: Secondary | ICD-10-CM | POA: Diagnosis not present

## 2019-08-04 DIAGNOSIS — G8929 Other chronic pain: Secondary | ICD-10-CM | POA: Diagnosis not present

## 2019-08-05 DIAGNOSIS — G8929 Other chronic pain: Secondary | ICD-10-CM | POA: Diagnosis not present

## 2019-08-06 DIAGNOSIS — G8929 Other chronic pain: Secondary | ICD-10-CM | POA: Diagnosis not present

## 2019-08-07 DIAGNOSIS — G8929 Other chronic pain: Secondary | ICD-10-CM | POA: Diagnosis not present

## 2019-08-08 DIAGNOSIS — G8929 Other chronic pain: Secondary | ICD-10-CM | POA: Diagnosis not present

## 2019-08-09 DIAGNOSIS — G8929 Other chronic pain: Secondary | ICD-10-CM | POA: Diagnosis not present

## 2019-08-10 DIAGNOSIS — G8929 Other chronic pain: Secondary | ICD-10-CM | POA: Diagnosis not present

## 2019-08-11 DIAGNOSIS — G8929 Other chronic pain: Secondary | ICD-10-CM | POA: Diagnosis not present

## 2019-08-12 DIAGNOSIS — G8929 Other chronic pain: Secondary | ICD-10-CM | POA: Diagnosis not present

## 2019-08-14 DIAGNOSIS — G8929 Other chronic pain: Secondary | ICD-10-CM | POA: Diagnosis not present

## 2019-08-15 DIAGNOSIS — G8929 Other chronic pain: Secondary | ICD-10-CM | POA: Diagnosis not present

## 2019-08-16 DIAGNOSIS — G8929 Other chronic pain: Secondary | ICD-10-CM | POA: Diagnosis not present

## 2019-08-17 DIAGNOSIS — G8929 Other chronic pain: Secondary | ICD-10-CM | POA: Diagnosis not present

## 2019-08-18 DIAGNOSIS — G8929 Other chronic pain: Secondary | ICD-10-CM | POA: Diagnosis not present

## 2019-08-19 DIAGNOSIS — G8929 Other chronic pain: Secondary | ICD-10-CM | POA: Diagnosis not present

## 2019-08-20 DIAGNOSIS — G8929 Other chronic pain: Secondary | ICD-10-CM | POA: Diagnosis not present

## 2019-08-21 DIAGNOSIS — G8929 Other chronic pain: Secondary | ICD-10-CM | POA: Diagnosis not present

## 2019-08-22 DIAGNOSIS — G8929 Other chronic pain: Secondary | ICD-10-CM | POA: Diagnosis not present

## 2019-08-23 DIAGNOSIS — M6281 Muscle weakness (generalized): Secondary | ICD-10-CM | POA: Diagnosis not present

## 2019-08-23 DIAGNOSIS — I5020 Unspecified systolic (congestive) heart failure: Secondary | ICD-10-CM | POA: Diagnosis not present

## 2019-08-23 DIAGNOSIS — R062 Wheezing: Secondary | ICD-10-CM | POA: Diagnosis not present

## 2019-08-23 DIAGNOSIS — G8929 Other chronic pain: Secondary | ICD-10-CM | POA: Diagnosis not present

## 2019-08-24 DIAGNOSIS — G8929 Other chronic pain: Secondary | ICD-10-CM | POA: Diagnosis not present

## 2019-08-25 DIAGNOSIS — G8929 Other chronic pain: Secondary | ICD-10-CM | POA: Diagnosis not present

## 2019-08-26 DIAGNOSIS — G8929 Other chronic pain: Secondary | ICD-10-CM | POA: Diagnosis not present

## 2019-08-28 DIAGNOSIS — G8929 Other chronic pain: Secondary | ICD-10-CM | POA: Diagnosis not present

## 2019-08-29 DIAGNOSIS — G8929 Other chronic pain: Secondary | ICD-10-CM | POA: Diagnosis not present

## 2019-08-30 DIAGNOSIS — G8929 Other chronic pain: Secondary | ICD-10-CM | POA: Diagnosis not present

## 2019-08-31 DIAGNOSIS — G8929 Other chronic pain: Secondary | ICD-10-CM | POA: Diagnosis not present

## 2019-09-01 DIAGNOSIS — G8929 Other chronic pain: Secondary | ICD-10-CM | POA: Diagnosis not present

## 2019-09-02 DIAGNOSIS — R32 Unspecified urinary incontinence: Secondary | ICD-10-CM | POA: Diagnosis not present

## 2019-09-02 DIAGNOSIS — G8929 Other chronic pain: Secondary | ICD-10-CM | POA: Diagnosis not present

## 2019-09-02 DIAGNOSIS — I5020 Unspecified systolic (congestive) heart failure: Secondary | ICD-10-CM | POA: Diagnosis not present

## 2019-09-02 DIAGNOSIS — R062 Wheezing: Secondary | ICD-10-CM | POA: Diagnosis not present

## 2019-09-02 DIAGNOSIS — M6281 Muscle weakness (generalized): Secondary | ICD-10-CM | POA: Diagnosis not present

## 2019-09-03 DIAGNOSIS — G8929 Other chronic pain: Secondary | ICD-10-CM | POA: Diagnosis not present

## 2019-09-04 DIAGNOSIS — G8929 Other chronic pain: Secondary | ICD-10-CM | POA: Diagnosis not present

## 2019-09-05 DIAGNOSIS — G8929 Other chronic pain: Secondary | ICD-10-CM | POA: Diagnosis not present

## 2019-09-06 DIAGNOSIS — G8929 Other chronic pain: Secondary | ICD-10-CM | POA: Diagnosis not present

## 2019-09-07 DIAGNOSIS — G8929 Other chronic pain: Secondary | ICD-10-CM | POA: Diagnosis not present

## 2019-09-08 DIAGNOSIS — G8929 Other chronic pain: Secondary | ICD-10-CM | POA: Diagnosis not present

## 2019-09-10 DIAGNOSIS — G8929 Other chronic pain: Secondary | ICD-10-CM | POA: Diagnosis not present

## 2019-09-11 DIAGNOSIS — G8929 Other chronic pain: Secondary | ICD-10-CM | POA: Diagnosis not present

## 2019-09-12 DIAGNOSIS — G8929 Other chronic pain: Secondary | ICD-10-CM | POA: Diagnosis not present

## 2019-09-13 DIAGNOSIS — M1990 Unspecified osteoarthritis, unspecified site: Secondary | ICD-10-CM | POA: Diagnosis not present

## 2019-09-13 DIAGNOSIS — E538 Deficiency of other specified B group vitamins: Secondary | ICD-10-CM | POA: Diagnosis not present

## 2019-09-13 DIAGNOSIS — Z23 Encounter for immunization: Secondary | ICD-10-CM | POA: Diagnosis not present

## 2019-09-13 DIAGNOSIS — N390 Urinary tract infection, site not specified: Secondary | ICD-10-CM | POA: Diagnosis not present

## 2019-09-13 DIAGNOSIS — R319 Hematuria, unspecified: Secondary | ICD-10-CM | POA: Diagnosis not present

## 2019-09-13 DIAGNOSIS — N920 Excessive and frequent menstruation with regular cycle: Secondary | ICD-10-CM | POA: Diagnosis not present

## 2019-09-13 DIAGNOSIS — E559 Vitamin D deficiency, unspecified: Secondary | ICD-10-CM | POA: Diagnosis not present

## 2019-09-13 DIAGNOSIS — D7589 Other specified diseases of blood and blood-forming organs: Secondary | ICD-10-CM | POA: Diagnosis not present

## 2019-09-13 DIAGNOSIS — B964 Proteus (mirabilis) (morganii) as the cause of diseases classified elsewhere: Secondary | ICD-10-CM | POA: Diagnosis not present

## 2019-09-13 DIAGNOSIS — G8929 Other chronic pain: Secondary | ICD-10-CM | POA: Diagnosis not present

## 2019-09-13 DIAGNOSIS — I10 Essential (primary) hypertension: Secondary | ICD-10-CM | POA: Diagnosis not present

## 2019-09-14 DIAGNOSIS — G8929 Other chronic pain: Secondary | ICD-10-CM | POA: Diagnosis not present

## 2019-09-15 DIAGNOSIS — G8929 Other chronic pain: Secondary | ICD-10-CM | POA: Diagnosis not present

## 2019-09-16 DIAGNOSIS — G8929 Other chronic pain: Secondary | ICD-10-CM | POA: Diagnosis not present

## 2019-09-17 DIAGNOSIS — G8929 Other chronic pain: Secondary | ICD-10-CM | POA: Diagnosis not present

## 2019-09-18 DIAGNOSIS — G8929 Other chronic pain: Secondary | ICD-10-CM | POA: Diagnosis not present

## 2019-09-19 DIAGNOSIS — G8929 Other chronic pain: Secondary | ICD-10-CM | POA: Diagnosis not present

## 2019-09-20 DIAGNOSIS — G8929 Other chronic pain: Secondary | ICD-10-CM | POA: Diagnosis not present

## 2019-09-21 DIAGNOSIS — G8929 Other chronic pain: Secondary | ICD-10-CM | POA: Diagnosis not present

## 2019-09-22 DIAGNOSIS — R062 Wheezing: Secondary | ICD-10-CM | POA: Diagnosis not present

## 2019-09-22 DIAGNOSIS — G8929 Other chronic pain: Secondary | ICD-10-CM | POA: Diagnosis not present

## 2019-09-22 DIAGNOSIS — M6281 Muscle weakness (generalized): Secondary | ICD-10-CM | POA: Diagnosis not present

## 2019-09-22 DIAGNOSIS — I5020 Unspecified systolic (congestive) heart failure: Secondary | ICD-10-CM | POA: Diagnosis not present

## 2019-09-23 DIAGNOSIS — G8929 Other chronic pain: Secondary | ICD-10-CM | POA: Diagnosis not present

## 2019-09-24 DIAGNOSIS — G8929 Other chronic pain: Secondary | ICD-10-CM | POA: Diagnosis not present

## 2019-09-25 DIAGNOSIS — G8929 Other chronic pain: Secondary | ICD-10-CM | POA: Diagnosis not present

## 2019-09-26 DIAGNOSIS — G8929 Other chronic pain: Secondary | ICD-10-CM | POA: Diagnosis not present

## 2019-09-27 DIAGNOSIS — G8929 Other chronic pain: Secondary | ICD-10-CM | POA: Diagnosis not present

## 2019-09-28 DIAGNOSIS — G8929 Other chronic pain: Secondary | ICD-10-CM | POA: Diagnosis not present

## 2019-09-29 DIAGNOSIS — G8929 Other chronic pain: Secondary | ICD-10-CM | POA: Diagnosis not present

## 2019-09-30 DIAGNOSIS — G8929 Other chronic pain: Secondary | ICD-10-CM | POA: Diagnosis not present

## 2019-10-02 DIAGNOSIS — G8929 Other chronic pain: Secondary | ICD-10-CM | POA: Diagnosis not present

## 2019-10-03 DIAGNOSIS — G8929 Other chronic pain: Secondary | ICD-10-CM | POA: Diagnosis not present

## 2019-10-04 DIAGNOSIS — G8929 Other chronic pain: Secondary | ICD-10-CM | POA: Diagnosis not present

## 2019-10-05 DIAGNOSIS — G8929 Other chronic pain: Secondary | ICD-10-CM | POA: Diagnosis not present

## 2019-10-06 DIAGNOSIS — G8929 Other chronic pain: Secondary | ICD-10-CM | POA: Diagnosis not present

## 2019-10-07 DIAGNOSIS — G8929 Other chronic pain: Secondary | ICD-10-CM | POA: Diagnosis not present

## 2019-10-08 DIAGNOSIS — G8929 Other chronic pain: Secondary | ICD-10-CM | POA: Diagnosis not present

## 2019-10-09 DIAGNOSIS — G8929 Other chronic pain: Secondary | ICD-10-CM | POA: Diagnosis not present

## 2019-10-10 DIAGNOSIS — G8929 Other chronic pain: Secondary | ICD-10-CM | POA: Diagnosis not present

## 2019-10-10 DIAGNOSIS — R32 Unspecified urinary incontinence: Secondary | ICD-10-CM | POA: Diagnosis not present

## 2019-10-10 DIAGNOSIS — M6281 Muscle weakness (generalized): Secondary | ICD-10-CM | POA: Diagnosis not present

## 2019-10-10 DIAGNOSIS — R062 Wheezing: Secondary | ICD-10-CM | POA: Diagnosis not present

## 2019-10-10 DIAGNOSIS — I5020 Unspecified systolic (congestive) heart failure: Secondary | ICD-10-CM | POA: Diagnosis not present

## 2019-10-11 DIAGNOSIS — G8929 Other chronic pain: Secondary | ICD-10-CM | POA: Diagnosis not present

## 2019-10-12 DIAGNOSIS — G8929 Other chronic pain: Secondary | ICD-10-CM | POA: Diagnosis not present

## 2019-10-13 DIAGNOSIS — G8929 Other chronic pain: Secondary | ICD-10-CM | POA: Diagnosis not present

## 2019-10-14 DIAGNOSIS — G8929 Other chronic pain: Secondary | ICD-10-CM | POA: Diagnosis not present

## 2019-10-15 DIAGNOSIS — G8929 Other chronic pain: Secondary | ICD-10-CM | POA: Diagnosis not present

## 2019-10-16 DIAGNOSIS — G8929 Other chronic pain: Secondary | ICD-10-CM | POA: Diagnosis not present

## 2019-10-17 DIAGNOSIS — G8929 Other chronic pain: Secondary | ICD-10-CM | POA: Diagnosis not present

## 2019-10-18 DIAGNOSIS — G8929 Other chronic pain: Secondary | ICD-10-CM | POA: Diagnosis not present

## 2019-10-19 DIAGNOSIS — G8929 Other chronic pain: Secondary | ICD-10-CM | POA: Diagnosis not present

## 2019-10-21 DIAGNOSIS — G8929 Other chronic pain: Secondary | ICD-10-CM | POA: Diagnosis not present

## 2019-10-22 DIAGNOSIS — G8929 Other chronic pain: Secondary | ICD-10-CM | POA: Diagnosis not present

## 2019-10-23 DIAGNOSIS — R062 Wheezing: Secondary | ICD-10-CM | POA: Diagnosis not present

## 2019-10-23 DIAGNOSIS — I5020 Unspecified systolic (congestive) heart failure: Secondary | ICD-10-CM | POA: Diagnosis not present

## 2019-10-23 DIAGNOSIS — M6281 Muscle weakness (generalized): Secondary | ICD-10-CM | POA: Diagnosis not present

## 2019-10-23 DIAGNOSIS — G8929 Other chronic pain: Secondary | ICD-10-CM | POA: Diagnosis not present

## 2019-10-24 DIAGNOSIS — G8929 Other chronic pain: Secondary | ICD-10-CM | POA: Diagnosis not present

## 2019-10-25 DIAGNOSIS — G8929 Other chronic pain: Secondary | ICD-10-CM | POA: Diagnosis not present

## 2019-10-26 DIAGNOSIS — G8929 Other chronic pain: Secondary | ICD-10-CM | POA: Diagnosis not present

## 2019-10-27 DIAGNOSIS — G8929 Other chronic pain: Secondary | ICD-10-CM | POA: Diagnosis not present

## 2019-10-28 DIAGNOSIS — G8929 Other chronic pain: Secondary | ICD-10-CM | POA: Diagnosis not present

## 2019-10-29 DIAGNOSIS — G8929 Other chronic pain: Secondary | ICD-10-CM | POA: Diagnosis not present

## 2019-10-30 DIAGNOSIS — G8929 Other chronic pain: Secondary | ICD-10-CM | POA: Diagnosis not present

## 2019-10-31 DIAGNOSIS — G8929 Other chronic pain: Secondary | ICD-10-CM | POA: Diagnosis not present

## 2019-11-01 DIAGNOSIS — G8929 Other chronic pain: Secondary | ICD-10-CM | POA: Diagnosis not present

## 2019-11-02 DIAGNOSIS — G8929 Other chronic pain: Secondary | ICD-10-CM | POA: Diagnosis not present

## 2019-11-03 DIAGNOSIS — G8929 Other chronic pain: Secondary | ICD-10-CM | POA: Diagnosis not present

## 2019-11-04 DIAGNOSIS — G8929 Other chronic pain: Secondary | ICD-10-CM | POA: Diagnosis not present

## 2019-11-05 DIAGNOSIS — G8929 Other chronic pain: Secondary | ICD-10-CM | POA: Diagnosis not present

## 2019-11-06 DIAGNOSIS — G8929 Other chronic pain: Secondary | ICD-10-CM | POA: Diagnosis not present

## 2019-11-07 DIAGNOSIS — G8929 Other chronic pain: Secondary | ICD-10-CM | POA: Diagnosis not present

## 2019-11-08 DIAGNOSIS — G8929 Other chronic pain: Secondary | ICD-10-CM | POA: Diagnosis not present

## 2019-11-08 DIAGNOSIS — R062 Wheezing: Secondary | ICD-10-CM | POA: Diagnosis not present

## 2019-11-08 DIAGNOSIS — M6281 Muscle weakness (generalized): Secondary | ICD-10-CM | POA: Diagnosis not present

## 2019-11-08 DIAGNOSIS — R32 Unspecified urinary incontinence: Secondary | ICD-10-CM | POA: Diagnosis not present

## 2019-11-09 DIAGNOSIS — G8929 Other chronic pain: Secondary | ICD-10-CM | POA: Diagnosis not present

## 2019-11-10 DIAGNOSIS — G8929 Other chronic pain: Secondary | ICD-10-CM | POA: Diagnosis not present

## 2019-11-11 DIAGNOSIS — G8929 Other chronic pain: Secondary | ICD-10-CM | POA: Diagnosis not present

## 2019-11-12 DIAGNOSIS — G8929 Other chronic pain: Secondary | ICD-10-CM | POA: Diagnosis not present

## 2019-11-13 DIAGNOSIS — G8929 Other chronic pain: Secondary | ICD-10-CM | POA: Diagnosis not present

## 2019-11-14 DIAGNOSIS — G8929 Other chronic pain: Secondary | ICD-10-CM | POA: Diagnosis not present

## 2019-11-15 DIAGNOSIS — G8929 Other chronic pain: Secondary | ICD-10-CM | POA: Diagnosis not present

## 2019-11-16 DIAGNOSIS — G8929 Other chronic pain: Secondary | ICD-10-CM | POA: Diagnosis not present

## 2019-11-18 DIAGNOSIS — G8929 Other chronic pain: Secondary | ICD-10-CM | POA: Diagnosis not present

## 2019-11-20 DIAGNOSIS — G8929 Other chronic pain: Secondary | ICD-10-CM | POA: Diagnosis not present

## 2019-11-21 ENCOUNTER — Other Ambulatory Visit: Payer: Self-pay | Admitting: Internal Medicine

## 2019-11-21 DIAGNOSIS — G8929 Other chronic pain: Secondary | ICD-10-CM | POA: Diagnosis not present

## 2019-11-22 DIAGNOSIS — M6281 Muscle weakness (generalized): Secondary | ICD-10-CM | POA: Diagnosis not present

## 2019-11-22 DIAGNOSIS — I5020 Unspecified systolic (congestive) heart failure: Secondary | ICD-10-CM | POA: Diagnosis not present

## 2019-11-22 DIAGNOSIS — G8929 Other chronic pain: Secondary | ICD-10-CM | POA: Diagnosis not present

## 2019-11-22 DIAGNOSIS — R062 Wheezing: Secondary | ICD-10-CM | POA: Diagnosis not present

## 2019-11-23 DIAGNOSIS — G8929 Other chronic pain: Secondary | ICD-10-CM | POA: Diagnosis not present

## 2019-11-24 DIAGNOSIS — G8929 Other chronic pain: Secondary | ICD-10-CM | POA: Diagnosis not present

## 2019-11-25 ENCOUNTER — Ambulatory Visit: Payer: Medicaid Other | Admitting: Internal Medicine

## 2019-11-25 DIAGNOSIS — G8929 Other chronic pain: Secondary | ICD-10-CM | POA: Diagnosis not present

## 2019-11-26 DIAGNOSIS — G8929 Other chronic pain: Secondary | ICD-10-CM | POA: Diagnosis not present

## 2019-11-27 DIAGNOSIS — G8929 Other chronic pain: Secondary | ICD-10-CM | POA: Diagnosis not present

## 2019-11-28 DIAGNOSIS — G8929 Other chronic pain: Secondary | ICD-10-CM | POA: Diagnosis not present

## 2019-11-29 DIAGNOSIS — G8929 Other chronic pain: Secondary | ICD-10-CM | POA: Diagnosis not present

## 2019-11-30 DIAGNOSIS — G8929 Other chronic pain: Secondary | ICD-10-CM | POA: Diagnosis not present

## 2019-12-01 DIAGNOSIS — G8929 Other chronic pain: Secondary | ICD-10-CM | POA: Diagnosis not present

## 2019-12-02 DIAGNOSIS — G8929 Other chronic pain: Secondary | ICD-10-CM | POA: Diagnosis not present

## 2019-12-03 DIAGNOSIS — G8929 Other chronic pain: Secondary | ICD-10-CM | POA: Diagnosis not present

## 2019-12-04 DIAGNOSIS — G8929 Other chronic pain: Secondary | ICD-10-CM | POA: Diagnosis not present

## 2019-12-05 DIAGNOSIS — G8929 Other chronic pain: Secondary | ICD-10-CM | POA: Diagnosis not present

## 2019-12-06 DIAGNOSIS — G8929 Other chronic pain: Secondary | ICD-10-CM | POA: Diagnosis not present

## 2019-12-07 DIAGNOSIS — G8929 Other chronic pain: Secondary | ICD-10-CM | POA: Diagnosis not present

## 2019-12-09 DIAGNOSIS — R32 Unspecified urinary incontinence: Secondary | ICD-10-CM | POA: Diagnosis not present

## 2019-12-09 DIAGNOSIS — G8929 Other chronic pain: Secondary | ICD-10-CM | POA: Diagnosis not present

## 2019-12-09 DIAGNOSIS — M6281 Muscle weakness (generalized): Secondary | ICD-10-CM | POA: Diagnosis not present

## 2019-12-09 DIAGNOSIS — R062 Wheezing: Secondary | ICD-10-CM | POA: Diagnosis not present

## 2019-12-09 DIAGNOSIS — I5020 Unspecified systolic (congestive) heart failure: Secondary | ICD-10-CM | POA: Diagnosis not present

## 2019-12-10 DIAGNOSIS — G8929 Other chronic pain: Secondary | ICD-10-CM | POA: Diagnosis not present

## 2019-12-11 DIAGNOSIS — G8929 Other chronic pain: Secondary | ICD-10-CM | POA: Diagnosis not present

## 2019-12-12 DIAGNOSIS — G8929 Other chronic pain: Secondary | ICD-10-CM | POA: Diagnosis not present

## 2019-12-13 DIAGNOSIS — G8929 Other chronic pain: Secondary | ICD-10-CM | POA: Diagnosis not present

## 2019-12-14 DIAGNOSIS — G8929 Other chronic pain: Secondary | ICD-10-CM | POA: Diagnosis not present

## 2019-12-15 DIAGNOSIS — G8929 Other chronic pain: Secondary | ICD-10-CM | POA: Diagnosis not present

## 2019-12-19 DIAGNOSIS — G8929 Other chronic pain: Secondary | ICD-10-CM | POA: Diagnosis not present

## 2019-12-20 ENCOUNTER — Other Ambulatory Visit: Payer: Self-pay | Admitting: Family

## 2019-12-20 DIAGNOSIS — G8929 Other chronic pain: Secondary | ICD-10-CM | POA: Diagnosis not present

## 2019-12-21 DIAGNOSIS — G8929 Other chronic pain: Secondary | ICD-10-CM | POA: Diagnosis not present

## 2019-12-22 DIAGNOSIS — G8929 Other chronic pain: Secondary | ICD-10-CM | POA: Diagnosis not present

## 2019-12-23 DIAGNOSIS — G8929 Other chronic pain: Secondary | ICD-10-CM | POA: Diagnosis not present

## 2019-12-23 DIAGNOSIS — M6281 Muscle weakness (generalized): Secondary | ICD-10-CM | POA: Diagnosis not present

## 2019-12-23 DIAGNOSIS — R062 Wheezing: Secondary | ICD-10-CM | POA: Diagnosis not present

## 2019-12-23 DIAGNOSIS — I5020 Unspecified systolic (congestive) heart failure: Secondary | ICD-10-CM | POA: Diagnosis not present

## 2019-12-24 DIAGNOSIS — G8929 Other chronic pain: Secondary | ICD-10-CM | POA: Diagnosis not present

## 2019-12-25 DIAGNOSIS — G8929 Other chronic pain: Secondary | ICD-10-CM | POA: Diagnosis not present

## 2019-12-27 DIAGNOSIS — G8929 Other chronic pain: Secondary | ICD-10-CM | POA: Diagnosis not present

## 2019-12-28 DIAGNOSIS — D7589 Other specified diseases of blood and blood-forming organs: Secondary | ICD-10-CM | POA: Diagnosis not present

## 2019-12-28 DIAGNOSIS — G8929 Other chronic pain: Secondary | ICD-10-CM | POA: Diagnosis not present

## 2019-12-29 ENCOUNTER — Ambulatory Visit: Payer: Medicaid Other | Admitting: Internal Medicine

## 2019-12-30 DIAGNOSIS — G8929 Other chronic pain: Secondary | ICD-10-CM | POA: Diagnosis not present

## 2019-12-31 DIAGNOSIS — G8929 Other chronic pain: Secondary | ICD-10-CM | POA: Diagnosis not present

## 2020-01-01 DIAGNOSIS — G8929 Other chronic pain: Secondary | ICD-10-CM | POA: Diagnosis not present

## 2020-01-02 DIAGNOSIS — G8929 Other chronic pain: Secondary | ICD-10-CM | POA: Diagnosis not present

## 2020-01-03 DIAGNOSIS — G8929 Other chronic pain: Secondary | ICD-10-CM | POA: Diagnosis not present

## 2020-01-04 DIAGNOSIS — G8929 Other chronic pain: Secondary | ICD-10-CM | POA: Diagnosis not present

## 2020-01-05 DIAGNOSIS — G8929 Other chronic pain: Secondary | ICD-10-CM | POA: Diagnosis not present

## 2020-01-06 DIAGNOSIS — G8929 Other chronic pain: Secondary | ICD-10-CM | POA: Diagnosis not present

## 2020-01-07 DIAGNOSIS — G8929 Other chronic pain: Secondary | ICD-10-CM | POA: Diagnosis not present

## 2020-01-08 DIAGNOSIS — G8929 Other chronic pain: Secondary | ICD-10-CM | POA: Diagnosis not present

## 2020-01-09 DIAGNOSIS — G8929 Other chronic pain: Secondary | ICD-10-CM | POA: Diagnosis not present

## 2020-01-10 DIAGNOSIS — G8929 Other chronic pain: Secondary | ICD-10-CM | POA: Diagnosis not present

## 2020-01-11 DIAGNOSIS — G8929 Other chronic pain: Secondary | ICD-10-CM | POA: Diagnosis not present

## 2020-01-12 DIAGNOSIS — R32 Unspecified urinary incontinence: Secondary | ICD-10-CM | POA: Diagnosis not present

## 2020-01-12 DIAGNOSIS — G8929 Other chronic pain: Secondary | ICD-10-CM | POA: Diagnosis not present

## 2020-01-13 DIAGNOSIS — G8929 Other chronic pain: Secondary | ICD-10-CM | POA: Diagnosis not present

## 2020-01-14 DIAGNOSIS — G8929 Other chronic pain: Secondary | ICD-10-CM | POA: Diagnosis not present

## 2020-01-15 DIAGNOSIS — G8929 Other chronic pain: Secondary | ICD-10-CM | POA: Diagnosis not present

## 2020-01-16 DIAGNOSIS — G8929 Other chronic pain: Secondary | ICD-10-CM | POA: Diagnosis not present

## 2020-01-17 DIAGNOSIS — G8929 Other chronic pain: Secondary | ICD-10-CM | POA: Diagnosis not present

## 2020-01-18 ENCOUNTER — Other Ambulatory Visit: Payer: Self-pay | Admitting: Family

## 2020-01-18 DIAGNOSIS — G8929 Other chronic pain: Secondary | ICD-10-CM | POA: Diagnosis not present

## 2020-01-19 DIAGNOSIS — G8929 Other chronic pain: Secondary | ICD-10-CM | POA: Diagnosis not present

## 2020-01-20 DIAGNOSIS — G8929 Other chronic pain: Secondary | ICD-10-CM | POA: Diagnosis not present

## 2020-01-21 DIAGNOSIS — G8929 Other chronic pain: Secondary | ICD-10-CM | POA: Diagnosis not present

## 2020-01-22 DIAGNOSIS — G8929 Other chronic pain: Secondary | ICD-10-CM | POA: Diagnosis not present

## 2020-01-23 DIAGNOSIS — G8929 Other chronic pain: Secondary | ICD-10-CM | POA: Diagnosis not present

## 2020-01-23 DIAGNOSIS — R062 Wheezing: Secondary | ICD-10-CM | POA: Diagnosis not present

## 2020-01-23 DIAGNOSIS — I5020 Unspecified systolic (congestive) heart failure: Secondary | ICD-10-CM | POA: Diagnosis not present

## 2020-01-23 DIAGNOSIS — M6281 Muscle weakness (generalized): Secondary | ICD-10-CM | POA: Diagnosis not present

## 2020-01-24 DIAGNOSIS — G8929 Other chronic pain: Secondary | ICD-10-CM | POA: Diagnosis not present

## 2020-01-25 DIAGNOSIS — G8929 Other chronic pain: Secondary | ICD-10-CM | POA: Diagnosis not present

## 2020-01-27 DIAGNOSIS — G8929 Other chronic pain: Secondary | ICD-10-CM | POA: Diagnosis not present

## 2020-01-28 DIAGNOSIS — G8929 Other chronic pain: Secondary | ICD-10-CM | POA: Diagnosis not present

## 2020-01-29 DIAGNOSIS — G8929 Other chronic pain: Secondary | ICD-10-CM | POA: Diagnosis not present

## 2020-01-30 DIAGNOSIS — G8929 Other chronic pain: Secondary | ICD-10-CM | POA: Diagnosis not present

## 2020-02-01 DIAGNOSIS — G8929 Other chronic pain: Secondary | ICD-10-CM | POA: Diagnosis not present

## 2020-02-02 ENCOUNTER — Other Ambulatory Visit: Payer: Self-pay | Admitting: Family

## 2020-02-02 DIAGNOSIS — G8929 Other chronic pain: Secondary | ICD-10-CM | POA: Diagnosis not present

## 2020-02-03 DIAGNOSIS — G8929 Other chronic pain: Secondary | ICD-10-CM | POA: Diagnosis not present

## 2020-02-04 DIAGNOSIS — G8929 Other chronic pain: Secondary | ICD-10-CM | POA: Diagnosis not present

## 2020-02-05 DIAGNOSIS — G8929 Other chronic pain: Secondary | ICD-10-CM | POA: Diagnosis not present

## 2020-02-06 DIAGNOSIS — G8929 Other chronic pain: Secondary | ICD-10-CM | POA: Diagnosis not present

## 2020-02-07 DIAGNOSIS — G8929 Other chronic pain: Secondary | ICD-10-CM | POA: Diagnosis not present

## 2020-02-08 DIAGNOSIS — G8929 Other chronic pain: Secondary | ICD-10-CM | POA: Diagnosis not present

## 2020-02-10 DIAGNOSIS — G8929 Other chronic pain: Secondary | ICD-10-CM | POA: Diagnosis not present

## 2020-02-12 DIAGNOSIS — G8929 Other chronic pain: Secondary | ICD-10-CM | POA: Diagnosis not present

## 2020-02-14 DIAGNOSIS — G8929 Other chronic pain: Secondary | ICD-10-CM | POA: Diagnosis not present

## 2020-02-15 DIAGNOSIS — M6281 Muscle weakness (generalized): Secondary | ICD-10-CM | POA: Diagnosis not present

## 2020-02-15 DIAGNOSIS — R32 Unspecified urinary incontinence: Secondary | ICD-10-CM | POA: Diagnosis not present

## 2020-02-15 DIAGNOSIS — R062 Wheezing: Secondary | ICD-10-CM | POA: Diagnosis not present

## 2020-02-16 DIAGNOSIS — G8929 Other chronic pain: Secondary | ICD-10-CM | POA: Diagnosis not present

## 2020-02-17 ENCOUNTER — Telehealth: Payer: Self-pay | Admitting: Internal Medicine

## 2020-02-17 DIAGNOSIS — G8929 Other chronic pain: Secondary | ICD-10-CM | POA: Diagnosis not present

## 2020-02-17 NOTE — Telephone Encounter (Signed)
Patient has been contacted at least 3 times for a recall, recall has been deleted  

## 2020-02-18 DIAGNOSIS — G8929 Other chronic pain: Secondary | ICD-10-CM | POA: Diagnosis not present

## 2020-02-19 DIAGNOSIS — G8929 Other chronic pain: Secondary | ICD-10-CM | POA: Diagnosis not present

## 2020-02-20 DIAGNOSIS — I5020 Unspecified systolic (congestive) heart failure: Secondary | ICD-10-CM | POA: Diagnosis not present

## 2020-02-20 DIAGNOSIS — M6281 Muscle weakness (generalized): Secondary | ICD-10-CM | POA: Diagnosis not present

## 2020-02-20 DIAGNOSIS — R062 Wheezing: Secondary | ICD-10-CM | POA: Diagnosis not present

## 2020-02-20 DIAGNOSIS — G8929 Other chronic pain: Secondary | ICD-10-CM | POA: Diagnosis not present

## 2020-02-21 DIAGNOSIS — G8929 Other chronic pain: Secondary | ICD-10-CM | POA: Diagnosis not present

## 2020-02-22 DIAGNOSIS — G8929 Other chronic pain: Secondary | ICD-10-CM | POA: Diagnosis not present

## 2020-02-23 DIAGNOSIS — G8929 Other chronic pain: Secondary | ICD-10-CM | POA: Diagnosis not present

## 2020-02-24 DIAGNOSIS — G8929 Other chronic pain: Secondary | ICD-10-CM | POA: Diagnosis not present

## 2020-02-25 DIAGNOSIS — G8929 Other chronic pain: Secondary | ICD-10-CM | POA: Diagnosis not present

## 2020-02-26 DIAGNOSIS — G8929 Other chronic pain: Secondary | ICD-10-CM | POA: Diagnosis not present

## 2020-02-27 DIAGNOSIS — G8929 Other chronic pain: Secondary | ICD-10-CM | POA: Diagnosis not present

## 2020-02-28 DIAGNOSIS — G8929 Other chronic pain: Secondary | ICD-10-CM | POA: Diagnosis not present

## 2020-02-29 DIAGNOSIS — G8929 Other chronic pain: Secondary | ICD-10-CM | POA: Diagnosis not present

## 2020-03-01 DIAGNOSIS — G8929 Other chronic pain: Secondary | ICD-10-CM | POA: Diagnosis not present

## 2020-03-02 DIAGNOSIS — G8929 Other chronic pain: Secondary | ICD-10-CM | POA: Diagnosis not present

## 2020-03-03 DIAGNOSIS — G8929 Other chronic pain: Secondary | ICD-10-CM | POA: Diagnosis not present

## 2020-03-04 DIAGNOSIS — G8929 Other chronic pain: Secondary | ICD-10-CM | POA: Diagnosis not present

## 2020-03-05 DIAGNOSIS — G8929 Other chronic pain: Secondary | ICD-10-CM | POA: Diagnosis not present

## 2020-03-06 DIAGNOSIS — G8929 Other chronic pain: Secondary | ICD-10-CM | POA: Diagnosis not present

## 2020-03-07 DIAGNOSIS — G8929 Other chronic pain: Secondary | ICD-10-CM | POA: Diagnosis not present

## 2020-03-08 DIAGNOSIS — G8929 Other chronic pain: Secondary | ICD-10-CM | POA: Diagnosis not present

## 2020-03-11 DIAGNOSIS — G8929 Other chronic pain: Secondary | ICD-10-CM | POA: Diagnosis not present

## 2020-03-12 DIAGNOSIS — G8929 Other chronic pain: Secondary | ICD-10-CM | POA: Diagnosis not present

## 2020-03-13 DIAGNOSIS — I5020 Unspecified systolic (congestive) heart failure: Secondary | ICD-10-CM | POA: Diagnosis not present

## 2020-03-13 DIAGNOSIS — M6281 Muscle weakness (generalized): Secondary | ICD-10-CM | POA: Diagnosis not present

## 2020-03-13 DIAGNOSIS — R32 Unspecified urinary incontinence: Secondary | ICD-10-CM | POA: Diagnosis not present

## 2020-03-13 DIAGNOSIS — G8929 Other chronic pain: Secondary | ICD-10-CM | POA: Diagnosis not present

## 2020-03-13 DIAGNOSIS — R062 Wheezing: Secondary | ICD-10-CM | POA: Diagnosis not present

## 2020-03-14 DIAGNOSIS — G8929 Other chronic pain: Secondary | ICD-10-CM | POA: Diagnosis not present

## 2020-03-15 DIAGNOSIS — G8929 Other chronic pain: Secondary | ICD-10-CM | POA: Diagnosis not present

## 2020-03-16 DIAGNOSIS — G8929 Other chronic pain: Secondary | ICD-10-CM | POA: Diagnosis not present

## 2020-03-17 ENCOUNTER — Other Ambulatory Visit: Payer: Self-pay | Admitting: Internal Medicine

## 2020-03-17 DIAGNOSIS — G8929 Other chronic pain: Secondary | ICD-10-CM | POA: Diagnosis not present

## 2020-03-18 DIAGNOSIS — G8929 Other chronic pain: Secondary | ICD-10-CM | POA: Diagnosis not present

## 2020-03-19 DIAGNOSIS — G8929 Other chronic pain: Secondary | ICD-10-CM | POA: Diagnosis not present

## 2020-03-20 DIAGNOSIS — G8929 Other chronic pain: Secondary | ICD-10-CM | POA: Diagnosis not present

## 2020-03-21 DIAGNOSIS — G8929 Other chronic pain: Secondary | ICD-10-CM | POA: Diagnosis not present

## 2020-03-22 DIAGNOSIS — G8929 Other chronic pain: Secondary | ICD-10-CM | POA: Diagnosis not present

## 2020-03-22 DIAGNOSIS — M6281 Muscle weakness (generalized): Secondary | ICD-10-CM | POA: Diagnosis not present

## 2020-03-22 DIAGNOSIS — I5020 Unspecified systolic (congestive) heart failure: Secondary | ICD-10-CM | POA: Diagnosis not present

## 2020-03-22 DIAGNOSIS — R062 Wheezing: Secondary | ICD-10-CM | POA: Diagnosis not present

## 2020-03-23 DIAGNOSIS — G8929 Other chronic pain: Secondary | ICD-10-CM | POA: Diagnosis not present

## 2020-03-24 DIAGNOSIS — G8929 Other chronic pain: Secondary | ICD-10-CM | POA: Diagnosis not present

## 2020-03-25 DIAGNOSIS — G8929 Other chronic pain: Secondary | ICD-10-CM | POA: Diagnosis not present

## 2020-03-26 DIAGNOSIS — G8929 Other chronic pain: Secondary | ICD-10-CM | POA: Diagnosis not present

## 2020-03-28 DIAGNOSIS — G8929 Other chronic pain: Secondary | ICD-10-CM | POA: Diagnosis not present

## 2020-03-29 DIAGNOSIS — G8929 Other chronic pain: Secondary | ICD-10-CM | POA: Diagnosis not present

## 2020-03-30 DIAGNOSIS — G8929 Other chronic pain: Secondary | ICD-10-CM | POA: Diagnosis not present

## 2020-03-31 DIAGNOSIS — G8929 Other chronic pain: Secondary | ICD-10-CM | POA: Diagnosis not present

## 2020-04-01 DIAGNOSIS — G8929 Other chronic pain: Secondary | ICD-10-CM | POA: Diagnosis not present

## 2020-04-02 DIAGNOSIS — G8929 Other chronic pain: Secondary | ICD-10-CM | POA: Diagnosis not present

## 2020-04-04 DIAGNOSIS — G8929 Other chronic pain: Secondary | ICD-10-CM | POA: Diagnosis not present

## 2020-04-05 DIAGNOSIS — G8929 Other chronic pain: Secondary | ICD-10-CM | POA: Diagnosis not present

## 2020-04-06 DIAGNOSIS — G8929 Other chronic pain: Secondary | ICD-10-CM | POA: Diagnosis not present

## 2020-04-07 DIAGNOSIS — G8929 Other chronic pain: Secondary | ICD-10-CM | POA: Diagnosis not present

## 2020-04-08 DIAGNOSIS — G8929 Other chronic pain: Secondary | ICD-10-CM | POA: Diagnosis not present

## 2020-04-09 DIAGNOSIS — G8929 Other chronic pain: Secondary | ICD-10-CM | POA: Diagnosis not present

## 2020-04-10 DIAGNOSIS — G8929 Other chronic pain: Secondary | ICD-10-CM | POA: Diagnosis not present

## 2020-04-11 DIAGNOSIS — G8929 Other chronic pain: Secondary | ICD-10-CM | POA: Diagnosis not present

## 2020-04-12 ENCOUNTER — Other Ambulatory Visit: Payer: Self-pay | Admitting: Family

## 2020-04-12 DIAGNOSIS — G8929 Other chronic pain: Secondary | ICD-10-CM | POA: Diagnosis not present

## 2020-04-13 DIAGNOSIS — G8929 Other chronic pain: Secondary | ICD-10-CM | POA: Diagnosis not present

## 2020-04-14 DIAGNOSIS — G8929 Other chronic pain: Secondary | ICD-10-CM | POA: Diagnosis not present

## 2020-04-15 DIAGNOSIS — G8929 Other chronic pain: Secondary | ICD-10-CM | POA: Diagnosis not present

## 2020-04-16 DIAGNOSIS — R32 Unspecified urinary incontinence: Secondary | ICD-10-CM | POA: Diagnosis not present

## 2020-04-16 DIAGNOSIS — R062 Wheezing: Secondary | ICD-10-CM | POA: Diagnosis not present

## 2020-04-16 DIAGNOSIS — G8929 Other chronic pain: Secondary | ICD-10-CM | POA: Diagnosis not present

## 2020-04-16 DIAGNOSIS — I5020 Unspecified systolic (congestive) heart failure: Secondary | ICD-10-CM | POA: Diagnosis not present

## 2020-04-16 DIAGNOSIS — M6281 Muscle weakness (generalized): Secondary | ICD-10-CM | POA: Diagnosis not present

## 2020-04-17 ENCOUNTER — Other Ambulatory Visit: Payer: Self-pay | Admitting: Internal Medicine

## 2020-04-17 DIAGNOSIS — G8929 Other chronic pain: Secondary | ICD-10-CM | POA: Diagnosis not present

## 2020-04-18 DIAGNOSIS — G8929 Other chronic pain: Secondary | ICD-10-CM | POA: Diagnosis not present

## 2020-04-18 NOTE — Telephone Encounter (Signed)
Attempted to schedule.  LMOV to call office.  ° °

## 2020-04-18 NOTE — Telephone Encounter (Signed)
Please schedule overdue 6 month F/U. Patient cancelled last appointment. Thank you!

## 2020-04-19 DIAGNOSIS — G8929 Other chronic pain: Secondary | ICD-10-CM | POA: Diagnosis not present

## 2020-04-20 DIAGNOSIS — G8929 Other chronic pain: Secondary | ICD-10-CM | POA: Diagnosis not present

## 2020-04-21 DIAGNOSIS — G8929 Other chronic pain: Secondary | ICD-10-CM | POA: Diagnosis not present

## 2020-04-21 DIAGNOSIS — R062 Wheezing: Secondary | ICD-10-CM | POA: Diagnosis not present

## 2020-04-21 DIAGNOSIS — M6281 Muscle weakness (generalized): Secondary | ICD-10-CM | POA: Diagnosis not present

## 2020-04-21 DIAGNOSIS — I5020 Unspecified systolic (congestive) heart failure: Secondary | ICD-10-CM | POA: Diagnosis not present

## 2020-04-22 DIAGNOSIS — G8929 Other chronic pain: Secondary | ICD-10-CM | POA: Diagnosis not present

## 2020-04-23 DIAGNOSIS — G8929 Other chronic pain: Secondary | ICD-10-CM | POA: Diagnosis not present

## 2020-04-23 NOTE — Telephone Encounter (Signed)
Attempted to schedule.  LMOV to call office.  ° °

## 2020-04-24 DIAGNOSIS — G8929 Other chronic pain: Secondary | ICD-10-CM | POA: Diagnosis not present

## 2020-04-25 DIAGNOSIS — G8929 Other chronic pain: Secondary | ICD-10-CM | POA: Diagnosis not present

## 2020-04-26 DIAGNOSIS — G8929 Other chronic pain: Secondary | ICD-10-CM | POA: Diagnosis not present

## 2020-04-27 DIAGNOSIS — G8929 Other chronic pain: Secondary | ICD-10-CM | POA: Diagnosis not present

## 2020-04-28 DIAGNOSIS — G8929 Other chronic pain: Secondary | ICD-10-CM | POA: Diagnosis not present

## 2020-04-29 DIAGNOSIS — G8929 Other chronic pain: Secondary | ICD-10-CM | POA: Diagnosis not present

## 2020-04-30 DIAGNOSIS — G8929 Other chronic pain: Secondary | ICD-10-CM | POA: Diagnosis not present

## 2020-05-01 DIAGNOSIS — G8929 Other chronic pain: Secondary | ICD-10-CM | POA: Diagnosis not present

## 2020-05-02 ENCOUNTER — Other Ambulatory Visit: Payer: Self-pay | Admitting: Internal Medicine

## 2020-05-02 DIAGNOSIS — G8929 Other chronic pain: Secondary | ICD-10-CM | POA: Diagnosis not present

## 2020-05-02 NOTE — Progress Notes (Deleted)
Follow-up Outpatient Visit Date: 05/03/2020  Primary Care Provider: [facility] [address] [facility] [place] [address]  Chief Complaint: ***  HPI:  [name] is a 59 y.o. female with history of HFpEF, hypertension, morbid obesity, obstructive sleep apnea, lymphedema, GERD, and depression, who presents for follow-up of HFpEF.  She had a virtual visit with Eula Listen, PA, in 03/2019, at which time she was doing relatively well from a heart standpoint.  She was maintained on furosemide 80 mg daily and metolazone 2.5 mg twice weekly.Marland Kitchen  --------------------------------------------------------------------------------------------------  Cardiovascular History & Procedures: Cardiovascular Problems:  Heart failure with preserved ejection fraction  Risk Factors:  Hypertension, obesity, and sedentary lifestyle  Cath/PCI:  None  CV Surgery:  None  EP Procedures and Devices:  None  Non-Invasive Evaluation(s):  TTE (03/11/18): Technically challenging due to body habitus. Normal LV size with moderate LVH.  LVEF 55-60%.    TTE (05/07/16): Normal LV size with LVEF of 60-65%. Normal RV size and function. Challenging study due to body habitus and patient in wheelchair.  Recent CV Pertinent Labs: Lab Results  Component Value Date   CHOL 151 07/22/2017   HDL 52 07/22/2017   LDLCALC 78 07/22/2017   TRIG 106 07/22/2017   CHOLHDL 2.9 07/22/2017   BNP 254 (H) 02/12/2013   K 4.1 07/06/2018   K 3.6 02/13/2013   BUN 27 (H) 07/06/2018   BUN 9 02/13/2013   CREATININE 0.98 07/06/2018   CREATININE 0.93 02/17/2013    Past medical and surgical history were reviewed and updated in EPIC.  No outpatient medications have been marked as taking for the 05/03/20 encounter (Appointment) with Zipporah Finamore, Cristal Deer, MD.    Allergies: Patient has no known allergies.  Social History   Tobacco Use  . Smoking status: Former Smoker    Years: 10.00    Types: Cigarettes  . Smokeless tobacco:  Never Used  . Tobacco comment: quit 6 years ago  Substance Use Topics  . Alcohol use: Yes    Alcohol/week: 1.0 standard drinks    Types: 1 Standard drinks or equivalent per week    Comment: occasional alcohol use on weekend  . Drug use: No    Family History  Problem Relation Age of Onset  . CVA Father   . Hypertension Father   . Hypertension Mother     Review of Systems: A 12-system review of systems was performed and was negative except as noted in the HPI.  --------------------------------------------------------------------------------------------------  Physical Exam: There were no vitals taken for this visit.  General:  *** HEENT: No conjunctival pallor or scleral icterus. Facemask in place. Neck: Supple without lymphadenopathy, thyromegaly, JVD, or HJR. Lungs: Normal work of breathing. Clear to auscultation bilaterally without wheezes or crackles. Heart: Regular rate and rhythm without murmurs, rubs, or gallops. Non-displaced PMI. Abd: Bowel sounds present. Soft, NT/ND without hepatosplenomegaly Ext: No lower extremity edema. Radial, PT, and DP pulses are 2+ bilaterally. Skin: Warm and dry without rash.  EKG:  ***  Lab Results  Component Value Date   WBC 6.7 07/22/2017   HGB 12.5 07/22/2017   HCT 36.6 07/22/2017   MCV 100.4 (H) 07/22/2017   PLT 191 07/22/2017    Lab Results  Component Value Date   NA 141 07/06/2018   K 4.1 07/06/2018   CL 98 07/06/2018   CO2 35 (H) 07/06/2018   BUN 27 (H) 07/06/2018   CREATININE 0.98 07/06/2018   GLUCOSE 110 (H) 07/06/2018   ALT 11 (L) 07/22/2017  Lab Results  Component Value Date   CHOL 151 07/22/2017   HDL 52 07/22/2017   LDLCALC 78 07/22/2017   TRIG 106 07/22/2017   CHOLHDL 2.9 07/22/2017    --------------------------------------------------------------------------------------------------  ASSESSMENT AND PLAN: Bailey Gave Harrol Novello, MD 05/02/2020 8:29 AM

## 2020-05-03 ENCOUNTER — Ambulatory Visit: Payer: Medicaid Other | Admitting: Internal Medicine

## 2020-05-04 DIAGNOSIS — G8929 Other chronic pain: Secondary | ICD-10-CM | POA: Diagnosis not present

## 2020-05-07 DIAGNOSIS — G8929 Other chronic pain: Secondary | ICD-10-CM | POA: Diagnosis not present

## 2020-05-08 ENCOUNTER — Emergency Department
Admission: EM | Admit: 2020-05-08 | Discharge: 2020-05-08 | Disposition: A | Discharge status: Home / Self Care | Payer: Medicaid Other | Attending: Student | Admitting: Student

## 2020-05-08 ENCOUNTER — Emergency Department: Payer: Medicaid Other

## 2020-05-08 ENCOUNTER — Other Ambulatory Visit: Payer: Self-pay

## 2020-05-08 DIAGNOSIS — Z7982 Long term (current) use of aspirin: Secondary | ICD-10-CM | POA: Insufficient documentation

## 2020-05-08 DIAGNOSIS — I5032 Chronic diastolic (congestive) heart failure: Secondary | ICD-10-CM | POA: Diagnosis not present

## 2020-05-08 DIAGNOSIS — Z87891 Personal history of nicotine dependence: Secondary | ICD-10-CM | POA: Insufficient documentation

## 2020-05-08 DIAGNOSIS — I110 Hypertensive heart disease with heart failure: Secondary | ICD-10-CM | POA: Insufficient documentation

## 2020-05-08 DIAGNOSIS — Z79899 Other long term (current) drug therapy: Secondary | ICD-10-CM | POA: Diagnosis not present

## 2020-05-08 DIAGNOSIS — N950 Postmenopausal bleeding: Secondary | ICD-10-CM

## 2020-05-08 DIAGNOSIS — R319 Hematuria, unspecified: Secondary | ICD-10-CM

## 2020-05-08 DIAGNOSIS — N939 Abnormal uterine and vaginal bleeding, unspecified: Secondary | ICD-10-CM | POA: Diagnosis not present

## 2020-05-08 LAB — URINALYSIS, COMPLETE (UACMP) WITH MICROSCOPIC
Bacteria, UA: NONE SEEN
RBC / HPF: >50 RBC/hpf — ABNORMAL HIGH (ref 0–5)
Specific Gravity, Urine: 1.017 (ref 1.005–1.030)
WBC, UA: >50 WBC/hpf — ABNORMAL HIGH (ref 0–5)

## 2020-05-08 LAB — BASIC METABOLIC PANEL
Anion gap: 8 (ref 5–15)
BUN: 11 mg/dL (ref 6–20)
CO2: 33 mmol/L — ABNORMAL HIGH (ref 22–32)
Calcium: 8.8 mg/dL — ABNORMAL LOW (ref 8.9–10.3)
Chloride: 93 mmol/L — ABNORMAL LOW (ref 98–111)
Creatinine, Ser: 0.78 mg/dL (ref 0.44–1.00)
GFR calc Af Amer: >60 mL/min (ref >60)
GFR calc non Af Amer: >60 mL/min (ref >60)
Glucose, Bld: 116 mg/dL — ABNORMAL HIGH (ref 70–99)
Potassium: 3.6 mmol/L (ref 3.5–5.1)
Sodium: 134 mmol/L — ABNORMAL LOW (ref 135–145)

## 2020-05-08 LAB — CBC
HCT: 45.2 % (ref 36.0–46.0)
Hemoglobin: 14.3 g/dL (ref 12.0–15.0)
MCH: 34.8 pg — ABNORMAL HIGH (ref 26.0–34.0)
MCHC: 31.6 g/dL (ref 30.0–36.0)
MCV: 110 fL — ABNORMAL HIGH (ref 80.0–100.0)
Platelets: 187 10*3/uL (ref 150–400)
RBC: 4.11 MIL/uL (ref 3.87–5.11)
RDW: 13.5 % (ref 11.5–15.5)
WBC: 7 10*3/uL (ref 4.0–10.5)
nRBC: 0 % (ref 0.0–0.2)

## 2020-05-08 LAB — PREGNANCY, URINE: Preg Test, Ur: NEGATIVE

## 2020-05-08 MED ORDER — CEPHALEXIN 500 MG PO CAPS: 500.0000 mg | ORAL_CAPSULE | Freq: Four times a day (QID) | ORAL | 0 refills | Status: AC

## 2020-05-08 NOTE — ED Provider Notes (Signed)
[facility] Emergency Department Provider Note  ____________________________________________   First MD Initiated Contact with Patient 05/08/20 1428     (approximate)  I have reviewed the triage vital signs and the nursing notes.  History  Chief Complaint Vaginal Bleeding    HPI [name] is a 59 y.o. female past medical history as below, who presents to the emergency department for vaginal vs urinary bleeding.  Patient states she has had intermittent very light spotting for the last few months, however over the last 10 days it has been more constant, daily.  She is passing small clots.  Unable to quantify an exact amount as she does not use pads or tampons. She admits she is unsure if blood is vaginal or urinary in etiology. States her daughter who helps care for her says it's possible it may be urinary. Denies any bloody stool or melena. Denies any abdominal pain.  Not on any blood thinning medications.  Last Pap smear was 5 years ago and reportedly normal.  On chart review, she saw her PCP for similar symptoms in September 2020, bleeding was ultimately felt to be urinary in etiology with a positive UA (proteus), treated with oral antibiotics.   Past Medical Hx Past Medical History:  Diagnosis Date  . Asthma 01/25/2018  . CHF (congestive heart failure) (HCC)   . Depression   . GERD (gastroesophageal reflux disease)   . Hypertension   . Lymphedema   . Morbid obesity (HCC)   . Sleep apnea     Problem List Patient Active Problem List   Diagnosis Date Noted  . Depression 11/12/2017  . (HFpEF) heart failure with preserved ejection fraction (HCC) 07/22/2017  . Chronic diastolic heart failure (HCC) 11/30/2015  . Benign essential HTN 11/30/2015  . Obstructive sleep apnea 11/30/2015  . Lymphedema of both lower extremities 11/30/2015  . Cellulitis 11/01/2015    Past Surgical Hx History reviewed. No pertinent surgical history.  Medications Prior  to Admission medications   Medication Sig Start Date End Date Taking? Authorizing Provider  aspirin EC 81 MG tablet Take 1 tablet (81 mg total) by mouth daily. 07/22/17  Yes End, Cristal Deer, MD  atenolol (TENORMIN) 50 MG tablet Take 1 tablet (50 mg total) by mouth 2 (two) times daily. 02/13/17  Yes Clarisa Kindred A, FNP  furosemide (LASIX) 80 MG tablet Take 1 tablet (80 mg total) by mouth daily. 02/13/17  Yes Hackney, Inetta Fermo A, FNP  albuterol (PROVENTIL HFA;VENTOLIN HFA) 108 (90 Base) MCG/ACT inhaler Inhale 2 puffs into the lungs every 6 (six) hours as needed for wheezing or shortness of breath.    [provider]  Cholecalciferol 250 MCG (10000 UT) CAPS Take 10,000 Units by mouth once a week.    [provider]  Cyanocobalamin 1000 MCG CAPS Take by mouth daily.    [provider]  Dimethicone-Zinc Oxide (SOOTHE & COOL INZO BARRIER) 5-5 % CREA Apply 1 application topically as needed.    [provider]  losartan (COZAAR) 100 MG tablet Take 1 tablet (100 mg total) by mouth daily. T 05/03/20   End, Cristal Deer, MD  metolazone (ZAROXOLYN) 2.5 MG tablet TAKE 1 TABLET (2.5 MG TOTAL) BY MOUTH 2 (TWO) TIMES A WEEK. 02/02/20   Delma Freeze, FNP  Zinc Oxide (DR SMITHS DIAPER RASH EX) Apply topically.    [provider]    Allergies Patient has no known allergies.  Family Hx Family History  Problem Relation Age of Onset  .  CVA Father   . Hypertension Father   . Hypertension Mother     Social Hx Social History   Tobacco Use  . Smoking status: Former Smoker    Years: 10.00    Types: Cigarettes  . Smokeless tobacco: Never Used  . Tobacco comment: quit 6 years ago  Substance Use Topics  . Alcohol use: Yes    Alcohol/week: 1.0 standard drinks    Types: 1 Standard drinks or equivalent per week    Comment: occasional alcohol use on weekend  . Drug use: No     Review of Systems  Constitutional: Negative for fever. Negative for chills. Eyes: Negative  for visual changes. ENT: Negative for sore throat. Cardiovascular: Negative for chest pain. Respiratory: Negative for shortness of breath. Gastrointestinal: Negative for nausea. Negative for vomiting.  Genitourinary: Negative for dysuria. + vaginal bleeding Musculoskeletal: Negative for leg swelling. Skin: Negative for rash. Neurological: Negative for headaches.   Physical Exam  Vital Signs: ED Triage Vitals  Enc Vitals Group     BP 05/08/20 0957 (!) 162/109     Pulse Rate 05/08/20 0957 78     Resp 05/08/20 1305 20     Temp 05/08/20 0957 98.7 F (37.1 C)     Temp Source 05/08/20 0957 Oral     SpO2 05/08/20 0957 93 %     Weight 05/08/20 0958 (!) 440 lb (199.6 kg)     Height 05/08/20 0958 5\' 2"  (1.575 m)     Head Circumference --      Peak Flow --      Pain Score 05/08/20 1019 8     Pain Loc --      Pain Edu? --      Excl. in [place]? --     Constitutional: Alert and oriented. BMI 80. NAD.  Head: Normocephalic. Atraumatic. Eyes: Conjunctivae clear. Sclera anicteric. Nose: No masses or lesions. No congestion or rhinorrhea. Mouth/Throat: Wearing mask.  Neck: No stridor. Trachea midline.  Cardiovascular: Normal rate, regular rhythm.  Respiratory: Normal respiratory effort.  Lungs CTA anteriorly. On baseline Trainer in setting of COPD. Gastrointestinal: Soft. Non-tender. Obese.  Genitourinary: RN chaperone present. Exam extremely limited due to body habitus, only able to perform external exam. Blood noted externally on labia, unclear if vaginal or urinary in etiology.  Musculoskeletal: No lower extremity edema. No deformities. Neurologic:  Normal speech and language. No gross focal or lateralizing neurologic deficits are appreciated.  Skin: Skin is warm, dry and intact. No rash noted. Psychiatric: Mood and affect are appropriate for situation.    Radiology  Personally reviewed available imaging myself.   US - IMPRESSION:  1. Markedly limited study secondary to the patient's  body habitus  without clear visualization of pelvic abnormalities. Pelvic MRI is  recommended for further evaluation.    Procedures  Procedure(s) performed (including critical care):  Procedures   Initial Impression / Assessment and Plan / MDM / ED Course  59 y.o. female who presents to the ED for vaginal vs urinary bleeding, as above. Hx similar symptoms in fall which was ultimately due to UTI  Ddx: post menopausal bleeding, endometrial etiology, fibroid, also consider urinary etiology. On chart review, patient presented w/ similar sx to her PCP in 08/2019 and was found to have urine + for UTI.   Labs initiated in triage reveal normal hemoglobin, normal platelets.  UA red, cloudy, with RBCs and WBCs, sent for culture.  Able to transfer from her wheelchair to the stretcher for an external  exam as above, unable to perform speculum exam due to limitations 2/2 body habitus. Will obtain transvaginal vs transabdominal ultrasound. She is agreeable.    [name] able to perform transabdominal exam, though fairly limited. Endometrium thickness noted to be 9 mm which is somewhat thick for post menopause. Will plan to treat for UTI, and also plan to refer to OBGYN for further work up of possible Gyn etiology of her symptoms. She is agreeable w/ plan. Given return precautions.    _______________________________   As part of my medical decision making I have reviewed available labs, radiology tests, reviewed old records/performed chart review.    Final Clinical Impression(s) / ED Diagnosis  Vaginal vs urinary bleeding Hematuria    Note:  This document was prepared using Dragon voice recognition software and may include unintentional dictation errors.   Miguel Aschoff., MD 05/09/20 1024

## 2020-05-08 NOTE — ED Triage Notes (Signed)
Pt c/o lower abd and back pain with vaginal bleeding for the past 10 days with some clots passing. States it has been over 10 years since her last period. Pt is in NAD< pt is on 2L [place] continuous.

## 2020-05-08 NOTE — ED Notes (Signed)
AAOx3.  Skin warm and dry.  NAD 

## 2020-05-08 NOTE — ED Notes (Signed)
Attempted to I/O cath with assist of 2, unable.

## 2020-05-08 NOTE — Discharge Instructions (Signed)
Thank you for letting us take care of you in the emergency department today. At this time, we think your symptoms are due to urine infection. We will send you home w/ antibiotics for this. However, it is important that you follow up with an OBGYN doctor as well to make sure the bleeding is not coming from a gynecologic (pelvic) source. Information for an OBGYN is below  Please continue to take any regular, prescribed medications.   New medications we have prescribed:  Keflex, for UTI  Please follow up with: Your primary care doctor to review your ER visit and follow up on your symptoms.  OBGYN doctor  Please return to the ER for any new or worsening symptoms.

## 2020-05-08 NOTE — ED Notes (Signed)
Pt updated in WR. VS reassessed.

## 2020-05-09 DIAGNOSIS — G8929 Other chronic pain: Secondary | ICD-10-CM | POA: Diagnosis not present

## 2020-05-10 DIAGNOSIS — G8929 Other chronic pain: Secondary | ICD-10-CM | POA: Diagnosis not present

## 2020-05-10 LAB — URINE CULTURE: Culture: >=100000 — AB

## 2020-05-11 DIAGNOSIS — G8929 Other chronic pain: Secondary | ICD-10-CM | POA: Diagnosis not present

## 2020-05-12 DIAGNOSIS — G8929 Other chronic pain: Secondary | ICD-10-CM | POA: Diagnosis not present

## 2020-05-13 DIAGNOSIS — G8929 Other chronic pain: Secondary | ICD-10-CM | POA: Diagnosis not present

## 2020-05-15 DIAGNOSIS — G8929 Other chronic pain: Secondary | ICD-10-CM | POA: Diagnosis not present

## 2020-05-16 DIAGNOSIS — I5020 Unspecified systolic (congestive) heart failure: Secondary | ICD-10-CM | POA: Diagnosis not present

## 2020-05-16 DIAGNOSIS — M6281 Muscle weakness (generalized): Secondary | ICD-10-CM | POA: Diagnosis not present

## 2020-05-16 DIAGNOSIS — R32 Unspecified urinary incontinence: Secondary | ICD-10-CM | POA: Diagnosis not present

## 2020-05-16 DIAGNOSIS — R062 Wheezing: Secondary | ICD-10-CM | POA: Diagnosis not present

## 2020-05-16 DIAGNOSIS — G8929 Other chronic pain: Secondary | ICD-10-CM | POA: Diagnosis not present

## 2020-05-17 DIAGNOSIS — G8929 Other chronic pain: Secondary | ICD-10-CM | POA: Diagnosis not present

## 2020-05-18 DIAGNOSIS — G8929 Other chronic pain: Secondary | ICD-10-CM | POA: Diagnosis not present

## 2020-05-19 DIAGNOSIS — G8929 Other chronic pain: Secondary | ICD-10-CM | POA: Diagnosis not present

## 2020-05-20 DIAGNOSIS — G8929 Other chronic pain: Secondary | ICD-10-CM | POA: Diagnosis not present

## 2020-05-21 DIAGNOSIS — G8929 Other chronic pain: Secondary | ICD-10-CM | POA: Diagnosis not present

## 2020-05-22 ENCOUNTER — Other Ambulatory Visit: Payer: Self-pay

## 2020-05-22 ENCOUNTER — Ambulatory Visit (INDEPENDENT_AMBULATORY_CARE_PROVIDER_SITE_OTHER): Payer: Medicaid Other | Admitting: Obstetrics and Gynecology

## 2020-05-22 ENCOUNTER — Encounter: Payer: Self-pay | Admitting: Obstetrics and Gynecology

## 2020-05-22 VITALS — Ht 62.0 in | Wt >= 6400 oz

## 2020-05-22 DIAGNOSIS — G8929 Other chronic pain: Secondary | ICD-10-CM | POA: Diagnosis not present

## 2020-05-22 DIAGNOSIS — R9389 Abnormal findings on diagnostic imaging of other specified body structures: Secondary | ICD-10-CM

## 2020-05-22 DIAGNOSIS — Z6841 Body Mass Index (BMI) 40.0 and over, adult: Secondary | ICD-10-CM

## 2020-05-22 DIAGNOSIS — I5020 Unspecified systolic (congestive) heart failure: Secondary | ICD-10-CM | POA: Diagnosis not present

## 2020-05-22 DIAGNOSIS — N950 Postmenopausal bleeding: Secondary | ICD-10-CM | POA: Diagnosis not present

## 2020-05-22 DIAGNOSIS — Z87440 Personal history of urinary (tract) infections: Secondary | ICD-10-CM

## 2020-05-22 DIAGNOSIS — M6281 Muscle weakness (generalized): Secondary | ICD-10-CM | POA: Diagnosis not present

## 2020-05-22 DIAGNOSIS — R062 Wheezing: Secondary | ICD-10-CM | POA: Diagnosis not present

## 2020-05-22 MED ORDER — MEGESTROL ACETATE 40 MG PO TABS
40.0000 mg | ORAL_TABLET | Freq: Two times a day (BID) | ORAL | 5 refills | Status: DC
Start: 2020-05-22 — End: 2021-02-25

## 2020-05-22 NOTE — Progress Notes (Signed)
Pt present as a ED follow up due to vaginal bleeding. Pt stated she noticed the bleeding last month with blood clots and cycle cramps. Pt stated her last cycle was in her late 7's early 73's.

## 2020-05-22 NOTE — Patient Instructions (Signed)
Postmenopausal Bleeding  Postmenopausal bleeding is any bleeding that a woman has after she has entered into menopause. Menopause is the end of a woman's fertile years. After menopause, a woman no longer ovulates and does not have menstrual periods. Postmenopausal bleeding may have various causes, including:  Menopausal hormone therapy (MHT).  Endometrial atrophy. After menopause, low estrogen hormone levels cause the membrane that lines the uterus (endometrium) to become thinner. You may have bleeding as the endometrium thins.  Endometrial hyperplasia. This condition is caused by excess estrogen hormones and low levels of progesterone hormones. The excess estrogen causes the endometrium to thicken, which can lead to bleeding. In some cases, this can lead to cancer of the uterus.  Endometrial cancer.  Non-cancerous growths (polyps) on the endometrium, the lining of the uterus, or the cervix.  Uterine fibroids. These are non-cancerous growths in or around the uterus muscle tissue that can cause heavy bleeding. Any type of postmenopausal bleeding, even if it appears to be a typical menstrual period, should be evaluated by your health care provider. Treatment will depend on the cause of the bleeding. Follow these instructions at home:  Pay attention to any changes in your symptoms.  Avoid using tampons and douches as told by your health care provider.  Change your pads regularly.  Get regular pelvic exams and Pap tests.  Take iron supplements as told by your health care provider.  Take over-the-counter and prescription medicines only as told by your health care provider.  Keep all follow-up visits as told by your health care provider. This is important. Contact a health care provider if:  Your bleeding lasts more than 1 week.  You have abdominal pain.  You have bleeding with or after sexual intercourse.  You have bleeding that happens more often than every 3 weeks. Get help  right away if: You have a fever, chills, headMegestrol tablets What is this medicine? MEGESTROL (me JES trol) belongs to a class of drugs known as progestins. Megestrol tablets are used to treat advanced breast or endometrial cancer. This medicine may be used for other purposes; ask your health care provider or pharmacist if you have questions. COMMON BRAND NAME(S): Megace What should I tell my health care provider before I take this medicine? They need to know if you have any of these conditions: adrenal gland problems history of blood clots of the legs, lungs, or other parts of the body diabetes kidney disease liver disease stroke an unusual or allergic reaction to megestrol, other medicines, foods, dyes, or preservatives pregnant or trying to get pregnant breast-feeding How should I use this medicine? Take this medicine by mouth. Follow the directions on the prescription label. Do not take your medicine more often than directed. Take your doses at regular intervals. Do not stop taking except on the advice of your doctor or health care professional. Talk to your pediatrician regarding the use of this medicine in children. Special care may be needed. Overdosage: If you think you have taken too much of this medicine contact a poison control center or emergency room at once. NOTE: This medicine is only for you. Do not share this medicine with others. What if I miss a dose? If you miss a dose, take it as soon as you can. If it is almost time for your next dose, take only that dose. Do not take double or extra doses. What may interact with this medicine? Do not take this medicine with any of the following medications: dofetilide This medicine  may also interact with the following medications: indinavir This list may not describe all possible interactions. Give your health care provider a list of all the medicines, herbs, non-prescription drugs, or dietary supplements you use. Also tell them  if you smoke, drink alcohol, or use illegal drugs. Some items may interact with your medicine. What should I watch for while using this medicine? Visit your doctor or health care professional for regular checks on your progress. Continue taking this medicine even if you feel better. It may take 2 months of regular use before you know if this medicine is working for your condition. This medicine can cause birth defects. Do not get pregnant while taking this drug. Females with child-bearing potential will need to have a negative pregnancy test before starting this medicine. Use an effective method of birth control while you are taking this medicine. If you think that you might be pregnant talk to your doctor right away. If you have diabetes, this medicine may affect blood sugar levels. Check your blood sugar and talk to your doctor or health care professional if you notice changes. What side effects may I notice from receiving this medicine? Side effects that you should report to your doctor or health care professional as soon as possible: Side effects that you should report to your doctor or health care professional as soon as possible: allergic reactions like skin rash, itching or hives, swelling of the face, lips, or tongue breathing problems dizziness increased blood pressure signs and symptoms of a blood clot such as breathing problems; changes in vision; chest pain; severe, sudden headache; pain, swelling, warmth in the leg; trouble speaking; sudden numbness or weakness of the face, arm or leg swelling of the ankles, feet, hands unusually weak or tired vomiting Side effects that usually do not require medical attention (report to your doctor or health care professional if they continue or are bothersome): breakthrough menstrual bleeding changes in sex drive or performance diarrhea gas hot flashes or flushing increased appetite upset stomach weight gain This list may not describe all  possible side effects. Call your doctor for medical advice about side effects. You may report side effects to FDA at 1-800-FDA-1088. Where should I keep my medicine? Keep out of the reach of children. Store at controlled room temperature between 15 and 30 degrees C (59 and 86 degrees F). Protect from heat above 40 degrees C (104 degrees F). Throw away any unused medicine after the expiration date. NOTE: This sheet is a summary. It may not cover all possible information. If you have questions about this medicine, talk to your doctor, pharmacist, or health care provider.  2020 Elsevier/Gold Standard (2017-12-16 10:07:34)  ache, dizziness, muscle aches, and bleeding.  You have severe pain with bleeding.  You are passing blood clots.  You have heavy bleeding, need more than 1 pad an hour, and have never experienced this before.  You feel faint. Summary  Postmenopausal bleeding is any bleeding that a woman has after she has entered into menopause.  Postmenopausal bleeding may have various causes. Treatment will depend on the cause of the bleeding.  Any type of postmenopausal bleeding, even if it appears to be a typical menstrual period, should be evaluated by your health care provider.  Be sure to pay attention to any changes in your symptoms and keep all follow-up visits as told by your health care provider. This information is not intended to replace advice given to you by your health care provider. Make sure you  discuss any questions you have with your health care provider. Document Revised: 03/17/2018 Document Reviewed: 03/03/2017 Elsevier Patient Education  2020 [employer].

## 2020-05-22 NOTE — Progress Notes (Signed)
GYNECOLOGY CLINIC PROGRESS NOTE  Subjective:    [name] is a 59 y.o. G1P1001 post-menopausal morbidly obese female who presents for concerns regarding vaginal bleeding. She has been menopausal for 10-15 years. She has never been on HRT.  Bleeding has been ongoing for approximately 2 months.  She notes that initially during the first month, the bleeding was episodic, would last for a few days then stop for a few days before returning.  During this most recent month, her bleeding has increased to the point of occurring daily, with passage of clots.  Bleeding is described as dark red. She was seen in the Emergency Room 2 weeks ago for the bleeding, had an ultrasound performed (however pelvic exam was deferred).  Workup to date: CBC and pelvic ultrasound.  Of note, patient was also found to have a UTI (E. Coli) while in the Emergency Room, notes she was treated with antibiotics, just completed course last Tuesday.   Gynecologic History: Menarche age: cannot recall No LMP recorded. Patient is postmenopausal.  Last pap smear: ~ 2016. Denies history of abnormal pap smears.  Mammogram: 2016.  Results were normal.     Past Medical History:  Diagnosis Date  . Asthma 01/25/2018  . CHF (congestive heart failure) ([place])   . Depression   . GERD (gastroesophageal reflux disease)   . Hypertension   . Lymphedema   . Morbid obesity ([place])   . Sleep apnea     Family History  Problem Relation Age of Onset  . CVA Father   . Hypertension Father   . Hypertension Mother     History reviewed. No pertinent surgical history.  Social History   Socioeconomic History  . Marital status: Divorced    Spouse name: Not on file  . Number of children: 1  . Years of education: 61  . Highest education level: 12th grade  Occupational History  . Occupation: Disability  Tobacco Use  . Smoking status: Former Smoker    Years: 10.00    Types: Cigarettes  . Smokeless tobacco: Never Used  . Tobacco  comment: quit 6 years ago  Substance and Sexual Activity  . Alcohol use: Yes    Alcohol/week: 1.0 standard drinks    Types: 1 Standard drinks or equivalent per week    Comment: occasional alcohol use on weekend  . Drug use: No  . Sexual activity: Not Currently    Birth control/protection: Condom  Other Topics Concern  . Not on file  Social History Narrative   Lives by herself. Daughter checks on her. Ambulates with walker   Social Determinants of Health   Financial Resource Strain:   . Difficulty of Paying Living Expenses:   Food Insecurity:   . Worried About Charity fundraiser in the Last Year:   . Arboriculturist in the Last Year:   Transportation Needs:   . Film/video editor (Medical):   Marland Kitchen Lack of Transportation (Non-Medical):   Physical Activity:   . Days of Exercise per Week:   . Minutes of Exercise per Session:   Stress:   . Feeling of Stress :   Social Connections:   . Frequency of Communication with Friends and Family:   . Frequency of Social Gatherings with Friends and Family:   . Attends Religious Services:   . Active Member of Clubs or Organizations:   . Attends Archivist Meetings:   Marland Kitchen Marital Status:   Intimate Partner Violence:   . Fear  of Current or Ex-Partner:   . Emotionally Abused:   Marland Kitchen Physically Abused:   . Sexually Abused:     Current Outpatient Medications on File Prior to Visit  Medication Sig Dispense Refill  . albuterol (PROVENTIL HFA;VENTOLIN HFA) 108 (90 Base) MCG/ACT inhaler Inhale 2 puffs into the lungs every 6 (six) hours as needed for wheezing or shortness of breath.    Marland Kitchen aspirin EC 81 MG tablet Take 1 tablet (81 mg total) by mouth daily. 90 tablet 3  . atenolol (TENORMIN) 50 MG tablet Take 1 tablet (50 mg total) by mouth 2 (two) times daily. 60 tablet 5  . bismuth subsalicylate (PEPTO BISMOL) 262 MG/15ML suspension Take 30 mLs by mouth every 6 (six) hours as needed.    . calcium carbonate (TUMS - DOSED IN MG ELEMENTAL  CALCIUM) 500 MG chewable tablet Chew 1 tablet by mouth daily as needed for indigestion or heartburn.    . Cholecalciferol 250 MCG (10000 UT) CAPS Take 10,000 Units by mouth once a week.    . Cyanocobalamin 1000 MCG CAPS Take by mouth daily.    . Dimethicone-Zinc Oxide (SOOTHE & COOL INZO BARRIER) 5-5 % CREA Apply 1 application topically as needed.    . furosemide (LASIX) 80 MG tablet Take 1 tablet (80 mg total) by mouth daily. 30 tablet 5  . Menthol, Topical Analgesic, (BIOFREEZE) 4 % GEL Apply topically.    . Probiotic Product (PROBIOTIC DAILY PO) Take by mouth.    . Zinc Oxide (DR SMITHS DIAPER RASH EX) Apply topically.     No current facility-administered medications on file prior to visit.    No Known Allergies  Review of Systems Pertinent items noted in HPI and remainder of comprehensive ROS otherwise negative.    Objective:    Ht 5\' 2"  (1.575 m)   Wt (!) 482 lb 8 oz (218.9 kg)   BMI 88.25 kg/m . Unable to assess blood pressure due to unavailable appropriate-sized cuff.   General appearance: alert and no distress Abdomen: soft, non-tender.  Large pannus with pitting edema  Pelvic: Very limited due to body habitus. External genitalia with small amount of dark red blood.  Vagina visualized with speculum, however moderate amount of blood noted in vault.  Unable to visualize cervix. Bimanual exam attempted but unable to palpate cervix or uterus, very anterior and high, also patient with prominent sacrum palpated. Adnexae not palpable.   Extremities: edema +1 to +2 pitting edema up to mid calf and venous stasis dermatitis noted Neurologic: Grossly normal    Labs:  Lab Results  Component Value Date   WBC 7.0 05/08/2020   HGB 14.3 05/08/2020   HCT 45.2 05/08/2020   MCV 110.0 (H) 05/08/2020   PLT 187 05/08/2020    No results found for: TSH    Imaging:  05/10/2020 PELVIS (TRANSABDOMINAL ONLY) CLINICAL DATA:  Postmenopausal bleeding times 10 days.  EXAM: TRANSABDOMINAL ULTRASOUND  OF PELVIS  TECHNIQUE: Transabdominal ultrasound examination of the pelvis was performed including evaluation of the uterus, ovaries, adnexal regions, and pelvic cul-de-sac.  COMPARISON:  None.  FINDINGS: Uterus  Measurements: 11.7 cm x 6.9 cm x 6.8 cm = volume: 287.2 mL. No fibroids or other mass visualized.  Endometrium  Thickness: 9 mm.  No focal abnormality visualized.  Right ovary  The right ovary is not clearly visualized.  Left ovary  The left ovary is not clearly visualized.  Other findings: It should be noted that the study is markedly limited secondary to the  patient's body habitus.  IMPRESSION: 1. Markedly limited study secondary to the patient's body habitus without clear visualization of pelvic abnormalities. Pelvic MRI is recommended for further evaluation.  Electronically Signed   By: Aram Candela M.D.   On: 05/08/2020 17:01   Assessment:    Postmenopausal bleeding  Morbid obesity ( BMI 88) Thickened endometrium History of UTI  Plan:   - Discussed etiologies of postmenopausal bleeding, concern about precancerous/hyperplasia or cancerous etiology (5 to 10% percent of cases). Also discussed role of unopposed estrogen exposure in leading to thickened or proliferative endometrium; and its possible correlation with endometrial hyperplasia/carcinoma.  Discussed that obesity is linked to endometrial pathology given that adipose cells produce extra estrogen (estrone) which can cause the endometrium to have a significant amount of estrogen exposure.    Uterine bleeding in postmenopausal women is usually light and self-limited. Exclusion of cancer is the main objective; therefore, treatment is usually unnecessary once cancer has been excluded.  The primary goal in the diagnostic evaluation of postmenopausal women with uterine bleeding is to exclude malignancy.  Patient's ultrasound notes endometrial thickness but no structural abnormalities. Unable to  visualize cervix or palpate uterus on exam due to body habitus, so unable to perform a pap smear or endometrial biopsy.  Further diagnostic evaluation is indicated however would likely require patient to have it performed in the OR for better visualization. Discussed treatment for abnormal bleeding with high dose progesterone (Megace) as treatment of PMB, as well as management of any possible hyperplasia. All questions answered.  - History of UTI, patient recently treated with antibiotics, no symptoms noted.  - Patient to f/u in 1-2 weeks to reassess bleeding. To f/u in 3 months for repeat ultrasound.     Hildred Laser, MD [facility]

## 2020-05-23 DIAGNOSIS — G8929 Other chronic pain: Secondary | ICD-10-CM | POA: Diagnosis not present

## 2020-05-28 DIAGNOSIS — G8929 Other chronic pain: Secondary | ICD-10-CM | POA: Diagnosis not present

## 2020-05-29 DIAGNOSIS — G8929 Other chronic pain: Secondary | ICD-10-CM | POA: Diagnosis not present

## 2020-05-30 DIAGNOSIS — G8929 Other chronic pain: Secondary | ICD-10-CM | POA: Diagnosis not present

## 2020-05-31 DIAGNOSIS — G8929 Other chronic pain: Secondary | ICD-10-CM | POA: Diagnosis not present

## 2020-06-01 DIAGNOSIS — G8929 Other chronic pain: Secondary | ICD-10-CM | POA: Diagnosis not present

## 2020-06-02 DIAGNOSIS — G8929 Other chronic pain: Secondary | ICD-10-CM | POA: Diagnosis not present

## 2020-06-03 DIAGNOSIS — G8929 Other chronic pain: Secondary | ICD-10-CM | POA: Diagnosis not present

## 2020-06-04 DIAGNOSIS — G8929 Other chronic pain: Secondary | ICD-10-CM | POA: Diagnosis not present

## 2020-06-05 DIAGNOSIS — G8929 Other chronic pain: Secondary | ICD-10-CM | POA: Diagnosis not present

## 2020-06-06 ENCOUNTER — Encounter: Payer: Medicaid Other | Admitting: Obstetrics and Gynecology

## 2020-06-06 DIAGNOSIS — G8929 Other chronic pain: Secondary | ICD-10-CM | POA: Diagnosis not present

## 2020-06-07 DIAGNOSIS — G8929 Other chronic pain: Secondary | ICD-10-CM | POA: Diagnosis not present

## 2020-06-08 DIAGNOSIS — G8929 Other chronic pain: Secondary | ICD-10-CM | POA: Diagnosis not present

## 2020-06-09 DIAGNOSIS — G8929 Other chronic pain: Secondary | ICD-10-CM | POA: Diagnosis not present

## 2020-06-10 DIAGNOSIS — G8929 Other chronic pain: Secondary | ICD-10-CM | POA: Diagnosis not present

## 2020-06-11 DIAGNOSIS — G8929 Other chronic pain: Secondary | ICD-10-CM | POA: Diagnosis not present

## 2020-06-12 DIAGNOSIS — G8929 Other chronic pain: Secondary | ICD-10-CM | POA: Diagnosis not present

## 2020-06-13 DIAGNOSIS — G8929 Other chronic pain: Secondary | ICD-10-CM | POA: Diagnosis not present

## 2020-06-14 DIAGNOSIS — G8929 Other chronic pain: Secondary | ICD-10-CM | POA: Diagnosis not present

## 2020-06-15 DIAGNOSIS — I5020 Unspecified systolic (congestive) heart failure: Secondary | ICD-10-CM | POA: Diagnosis not present

## 2020-06-15 DIAGNOSIS — M6281 Muscle weakness (generalized): Secondary | ICD-10-CM | POA: Diagnosis not present

## 2020-06-15 DIAGNOSIS — R32 Unspecified urinary incontinence: Secondary | ICD-10-CM | POA: Diagnosis not present

## 2020-06-15 DIAGNOSIS — R062 Wheezing: Secondary | ICD-10-CM | POA: Diagnosis not present

## 2020-06-15 DIAGNOSIS — G8929 Other chronic pain: Secondary | ICD-10-CM | POA: Diagnosis not present

## 2020-06-16 DIAGNOSIS — G8929 Other chronic pain: Secondary | ICD-10-CM | POA: Diagnosis not present

## 2020-06-17 DIAGNOSIS — G8929 Other chronic pain: Secondary | ICD-10-CM | POA: Diagnosis not present

## 2020-06-18 DIAGNOSIS — G8929 Other chronic pain: Secondary | ICD-10-CM | POA: Diagnosis not present

## 2020-06-19 DIAGNOSIS — G8929 Other chronic pain: Secondary | ICD-10-CM | POA: Diagnosis not present

## 2020-06-20 ENCOUNTER — Telehealth: Payer: Self-pay | Admitting: Obstetrics and Gynecology

## 2020-06-20 DIAGNOSIS — G8929 Other chronic pain: Secondary | ICD-10-CM | POA: Diagnosis not present

## 2020-06-20 NOTE — Telephone Encounter (Signed)
Called patient to get her scheduled for an appointment per Dr. Valentino Saxon for abnormal bleeding. Was unable to reach pain, left message for patient to call office.

## 2020-06-21 DIAGNOSIS — G8929 Other chronic pain: Secondary | ICD-10-CM | POA: Diagnosis not present

## 2020-06-21 DIAGNOSIS — R062 Wheezing: Secondary | ICD-10-CM | POA: Diagnosis not present

## 2020-06-21 DIAGNOSIS — M6281 Muscle weakness (generalized): Secondary | ICD-10-CM | POA: Diagnosis not present

## 2020-06-21 DIAGNOSIS — I5020 Unspecified systolic (congestive) heart failure: Secondary | ICD-10-CM | POA: Diagnosis not present

## 2020-06-22 DIAGNOSIS — G8929 Other chronic pain: Secondary | ICD-10-CM | POA: Diagnosis not present

## 2020-06-23 DIAGNOSIS — G8929 Other chronic pain: Secondary | ICD-10-CM | POA: Diagnosis not present

## 2020-07-03 ENCOUNTER — Encounter: Payer: Medicaid Other | Admitting: Obstetrics and Gynecology

## 2020-07-14 DIAGNOSIS — I5020 Unspecified systolic (congestive) heart failure: Secondary | ICD-10-CM | POA: Diagnosis not present

## 2020-07-14 DIAGNOSIS — R062 Wheezing: Secondary | ICD-10-CM | POA: Diagnosis not present

## 2020-07-14 DIAGNOSIS — M6281 Muscle weakness (generalized): Secondary | ICD-10-CM | POA: Diagnosis not present

## 2020-07-14 DIAGNOSIS — R32 Unspecified urinary incontinence: Secondary | ICD-10-CM | POA: Diagnosis not present

## 2020-07-15 ENCOUNTER — Other Ambulatory Visit: Payer: Self-pay | Admitting: Obstetrics and Gynecology

## 2020-07-22 DIAGNOSIS — I5020 Unspecified systolic (congestive) heart failure: Secondary | ICD-10-CM | POA: Diagnosis not present

## 2020-07-22 DIAGNOSIS — M6281 Muscle weakness (generalized): Secondary | ICD-10-CM | POA: Diagnosis not present

## 2020-07-22 DIAGNOSIS — R062 Wheezing: Secondary | ICD-10-CM | POA: Diagnosis not present

## 2020-07-23 ENCOUNTER — Encounter: Payer: Self-pay | Admitting: Obstetrics and Gynecology

## 2020-08-20 DIAGNOSIS — R062 Wheezing: Secondary | ICD-10-CM | POA: Diagnosis not present

## 2020-08-20 DIAGNOSIS — M6281 Muscle weakness (generalized): Secondary | ICD-10-CM | POA: Diagnosis not present

## 2020-08-20 DIAGNOSIS — I5020 Unspecified systolic (congestive) heart failure: Secondary | ICD-10-CM | POA: Diagnosis not present

## 2020-08-20 DIAGNOSIS — R32 Unspecified urinary incontinence: Secondary | ICD-10-CM | POA: Diagnosis not present

## 2020-08-21 ENCOUNTER — Other Ambulatory Visit: Payer: Self-pay | Admitting: Obstetrics and Gynecology

## 2020-08-22 DIAGNOSIS — M6281 Muscle weakness (generalized): Secondary | ICD-10-CM | POA: Diagnosis not present

## 2020-08-22 DIAGNOSIS — R062 Wheezing: Secondary | ICD-10-CM | POA: Diagnosis not present

## 2020-08-22 DIAGNOSIS — I5020 Unspecified systolic (congestive) heart failure: Secondary | ICD-10-CM | POA: Diagnosis not present

## 2020-09-04 ENCOUNTER — Other Ambulatory Visit: Payer: Medicaid Other

## 2020-09-12 ENCOUNTER — Other Ambulatory Visit: Payer: Self-pay | Admitting: Obstetrics and Gynecology

## 2020-09-15 DIAGNOSIS — B029 Zoster without complications: Secondary | ICD-10-CM | POA: Diagnosis not present

## 2020-09-21 DIAGNOSIS — R062 Wheezing: Secondary | ICD-10-CM | POA: Diagnosis not present

## 2020-09-21 DIAGNOSIS — M6281 Muscle weakness (generalized): Secondary | ICD-10-CM | POA: Diagnosis not present

## 2020-09-21 DIAGNOSIS — I5020 Unspecified systolic (congestive) heart failure: Secondary | ICD-10-CM | POA: Diagnosis not present

## 2020-09-24 DIAGNOSIS — I5020 Unspecified systolic (congestive) heart failure: Secondary | ICD-10-CM | POA: Diagnosis not present

## 2020-09-24 DIAGNOSIS — M6281 Muscle weakness (generalized): Secondary | ICD-10-CM | POA: Diagnosis not present

## 2020-09-24 DIAGNOSIS — R062 Wheezing: Secondary | ICD-10-CM | POA: Diagnosis not present

## 2020-09-24 DIAGNOSIS — R32 Unspecified urinary incontinence: Secondary | ICD-10-CM | POA: Diagnosis not present

## 2020-10-22 DIAGNOSIS — R062 Wheezing: Secondary | ICD-10-CM | POA: Diagnosis not present

## 2020-10-22 DIAGNOSIS — M6281 Muscle weakness (generalized): Secondary | ICD-10-CM | POA: Diagnosis not present

## 2020-10-22 DIAGNOSIS — I5020 Unspecified systolic (congestive) heart failure: Secondary | ICD-10-CM | POA: Diagnosis not present

## 2020-10-24 DIAGNOSIS — I509 Heart failure, unspecified: Secondary | ICD-10-CM | POA: Diagnosis not present

## 2020-10-24 DIAGNOSIS — I10 Essential (primary) hypertension: Secondary | ICD-10-CM | POA: Diagnosis not present

## 2020-10-24 DIAGNOSIS — Z0001 Encounter for general adult medical examination with abnormal findings: Secondary | ICD-10-CM | POA: Diagnosis not present

## 2020-10-24 DIAGNOSIS — Z23 Encounter for immunization: Secondary | ICD-10-CM | POA: Diagnosis not present

## 2020-11-06 ENCOUNTER — Inpatient Hospital Stay: Payer: Medicaid Other

## 2020-11-06 ENCOUNTER — Inpatient Hospital Stay: Payer: Medicaid Other | Attending: Oncology | Admitting: Oncology

## 2020-11-06 ENCOUNTER — Encounter: Payer: Self-pay | Admitting: Oncology

## 2020-11-06 ENCOUNTER — Telehealth: Payer: Self-pay

## 2020-11-06 VITALS — BP 166/118 | HR 79 | Temp 98.5°F | Resp 22 | Wt >= 6400 oz

## 2020-11-06 DIAGNOSIS — Z809 Family history of malignant neoplasm, unspecified: Secondary | ICD-10-CM | POA: Diagnosis not present

## 2020-11-06 DIAGNOSIS — D539 Nutritional anemia, unspecified: Secondary | ICD-10-CM

## 2020-11-06 DIAGNOSIS — Z79899 Other long term (current) drug therapy: Secondary | ICD-10-CM | POA: Insufficient documentation

## 2020-11-06 DIAGNOSIS — D7589 Other specified diseases of blood and blood-forming organs: Secondary | ICD-10-CM | POA: Insufficient documentation

## 2020-11-06 DIAGNOSIS — Z87891 Personal history of nicotine dependence: Secondary | ICD-10-CM | POA: Diagnosis not present

## 2020-11-06 DIAGNOSIS — Z6841 Body Mass Index (BMI) 40.0 and over, adult: Secondary | ICD-10-CM | POA: Insufficient documentation

## 2020-11-06 DIAGNOSIS — D751 Secondary polycythemia: Secondary | ICD-10-CM | POA: Diagnosis not present

## 2020-11-06 LAB — COMPREHENSIVE METABOLIC PANEL
ALT: 13 U/L (ref 0–44)
AST: 21 U/L (ref 15–41)
Albumin: 3.9 g/dL (ref 3.5–5.0)
Alkaline Phosphatase: 64 U/L (ref 38–126)
Anion gap: 10 (ref 5–15)
BUN: 11 mg/dL (ref 6–20)
CO2: 29 mmol/L (ref 22–32)
Calcium: 9.2 mg/dL (ref 8.9–10.3)
Chloride: 100 mmol/L (ref 98–111)
Creatinine, Ser: 0.79 mg/dL (ref 0.44–1.00)
GFR, Estimated: >60 mL/min (ref >60)
Glucose, Bld: 121 mg/dL — ABNORMAL HIGH (ref 70–99)
Potassium: 4 mmol/L (ref 3.5–5.1)
Sodium: 139 mmol/L (ref 135–145)
Total Bilirubin: 1.1 mg/dL (ref 0.3–1.2)
Total Protein: 7.7 g/dL (ref 6.5–8.1)

## 2020-11-06 LAB — CBC WITH DIFFERENTIAL/PLATELET
Abs Immature Granulocytes: 0.04 10*3/uL (ref 0.00–0.07)
Basophils Absolute: 0 10*3/uL (ref 0.0–0.1)
Basophils Relative: 1 %
Eosinophils Absolute: 0.1 10*3/uL (ref 0.0–0.5)
Eosinophils Relative: 2 %
HCT: 50.8 % — ABNORMAL HIGH (ref 36.0–46.0)
Hemoglobin: 16.1 g/dL — ABNORMAL HIGH (ref 12.0–15.0)
Immature Granulocytes: 1 %
Lymphocytes Relative: 39 %
Lymphs Abs: 2.4 10*3/uL (ref 0.7–4.0)
MCH: 34.6 pg — ABNORMAL HIGH (ref 26.0–34.0)
MCHC: 31.7 g/dL (ref 30.0–36.0)
MCV: 109.2 fL — ABNORMAL HIGH (ref 80.0–100.0)
Monocytes Absolute: 0.7 10*3/uL (ref 0.1–1.0)
Monocytes Relative: 11 %
Neutro Abs: 2.9 10*3/uL (ref 1.7–7.7)
Neutrophils Relative %: 46 %
Platelets: 280 10*3/uL (ref 150–400)
RBC: 4.65 MIL/uL (ref 3.87–5.11)
RDW: 15 % (ref 11.5–15.5)
WBC: 6.1 10*3/uL (ref 4.0–10.5)
nRBC: 0 % (ref 0.0–0.2)

## 2020-11-06 LAB — RETIC PANEL
Immature Retic Fract: 21.8 % — ABNORMAL HIGH (ref 2.3–15.9)
RBC.: 4.53 MIL/uL (ref 3.87–5.11)
Retic Count, Absolute: 130.5 10*3/uL (ref 19.0–186.0)
Retic Ct Pct: 2.9 % (ref 0.4–3.1)
Reticulocyte Hemoglobin: 36.4 pg (ref >27.9)

## 2020-11-06 LAB — TSH: TSH: 2.198 u[IU]/mL (ref 0.350–4.500)

## 2020-11-06 LAB — FOLATE: Folate: 8.4 ng/mL (ref >5.9)

## 2020-11-06 LAB — TECHNOLOGIST SMEAR REVIEW
Plt Morphology: NORMAL
RBC Morphology: NORMAL
WBC Morphology: NORMAL

## 2020-11-06 LAB — VITAMIN B12: Vitamin B-12: 479 pg/mL (ref 180–914)

## 2020-11-06 LAB — LACTATE DEHYDROGENASE: LDH: 119 U/L (ref 98–192)

## 2020-11-06 NOTE — Telephone Encounter (Signed)
Done..  Pt has been added for labs on 11/09/20 as requested

## 2020-11-06 NOTE — Progress Notes (Signed)
Hematology/Oncology Consult note [facility] [phone] [phone]   Patient Care Team: [facility], [facility] as PCP - General (Family Medicine) End, Harrell Gave, MD as PCP - Cardiology (Cardiology) Alisa Graff, FNP as Nurse Practitioner (Family Medicine)  REFERRING PROVIDER: Baxter Hire, MD  CHIEF COMPLAINTS/REASON FOR VISIT:  Evaluation of macrocytosis  HISTORY OF PRESENTING ILLNESS:   [name] is a  59 y.o.  female with PMH listed below was seen in consultation at the request of  Baxter Hire, MD  for evaluation of macrocytosis  Patient has blood work done on 05/08/2020.  Hemoglobin 14.3, MCV 110. Patient recently establish care with Dr. Edwina Barth.  Refer to hematology for evaluation. Patient denies any constitutional symptoms. Denies any pain today.  She quitted smoking 7 to 8 years ago.  Reports occasional alcohol use on weekends. Lives by herself.  Review of Systems  Constitutional: Positive for fatigue. Negative for appetite change, chills and fever.  HENT:   Negative for hearing loss and voice change.   Eyes: Negative for eye problems.  Respiratory: Negative for chest tightness and cough.   Cardiovascular: Negative for chest pain.  Gastrointestinal: Negative for abdominal distention, abdominal pain and blood in stool.  Endocrine: Negative for hot flashes.  Genitourinary: Negative for difficulty urinating and frequency.   Musculoskeletal: Negative for arthralgias.  Skin: Negative for itching and rash.  Neurological: Negative for extremity weakness.  Hematological: Negative for adenopathy.  Psychiatric/Behavioral: Negative for confusion.    MEDICAL HISTORY:  Past Medical History:  Diagnosis Date  . Asthma 01/25/2018  . CHF (congestive heart failure) ([place])   . Depression   . GERD (gastroesophageal reflux disease)   . Hypertension   . Lymphedema   . Morbid obesity ([place])   . Sleep apnea     SURGICAL  HISTORY: History reviewed. No pertinent surgical history.  SOCIAL HISTORY: Social History   Socioeconomic History  . Marital status: Divorced    Spouse name: Not on file  . Number of children: 1  . Years of education: 86  . Highest education level: 12th grade  Occupational History  . Occupation: Disability  Tobacco Use  . Smoking status: Former Smoker    Years: 10.00    Types: Cigarettes    Quit date: 11/06/2013    Years since quitting: 7.0  . Smokeless tobacco: Never Used  . Tobacco comment: quit 7 years ago  Vaping Use  . Vaping Use: Never used  Substance and Sexual Activity  . Alcohol use: Yes    Alcohol/week: 1.0 standard drink    Types: 1 Standard drinks or equivalent per week    Comment: occasional alcohol use on weekend  . Drug use: No  . Sexual activity: Not Currently    Birth control/protection: Condom  Other Topics Concern  . Not on file  Social History Narrative   Lives by herself. Daughter checks on her. Ambulates with walker   Social Determinants of Health   Financial Resource Strain:   . Difficulty of Paying Living Expenses: Not on file  Food Insecurity:   . Worried About Charity fundraiser in the Last Year: Not on file  . Ran Out of Food in the Last Year: Not on file  Transportation Needs:   . Lack of Transportation (Medical): Not on file  . Lack of Transportation (Non-Medical): Not on file  Physical Activity:   . Days of Exercise per Week: Not on file  . Minutes of Exercise per Session:  Not on file  Stress:   . Feeling of Stress : Not on file  Social Connections:   . Frequency of Communication with Friends and Family: Not on file  . Frequency of Social Gatherings with Friends and Family: Not on file  . Attends Religious Services: Not on file  . Active Member of Clubs or Organizations: Not on file  . Attends Archivist Meetings: Not on file  . Marital Status: Not on file  Intimate Partner Violence:   . Fear of Current or  Ex-Partner: Not on file  . Emotionally Abused: Not on file  . Physically Abused: Not on file  . Sexually Abused: Not on file    FAMILY HISTORY: Family History  Problem Relation Age of Onset  . CVA Father   . Hypertension Father   . Seizures Father   . Hypertension Mother   . Kidney disease Mother   . Cancer Paternal Grandmother     ALLERGIES:  has No Known Allergies.  MEDICATIONS:  Current Outpatient Medications  Medication Sig Dispense Refill  . albuterol (PROVENTIL HFA;VENTOLIN HFA) 108 (90 Base) MCG/ACT inhaler Inhale 2 puffs into the lungs every 6 (six) hours as needed for wheezing or shortness of breath.    Marland Kitchen aspirin EC 81 MG tablet Take 1 tablet (81 mg total) by mouth daily. 90 tablet 3  . atenolol (TENORMIN) 50 MG tablet Take 1 tablet (50 mg total) by mouth 2 (two) times daily. 60 tablet 5  . bismuth subsalicylate (PEPTO BISMOL) 262 MG/15ML suspension Take 30 mLs by mouth every 6 (six) hours as needed.    . calcium carbonate (TUMS - DOSED IN MG ELEMENTAL CALCIUM) 500 MG chewable tablet Chew 1 tablet by mouth daily as needed for indigestion or heartburn.    . Cyanocobalamin 1000 MCG CAPS Take by mouth daily.    . Dimethicone-Zinc Oxide (SOOTHE & COOL INZO BARRIER) 5-5 % CREA Apply 1 application topically as needed.    . furosemide (LASIX) 80 MG tablet Take 1 tablet (80 mg total) by mouth daily. 30 tablet 5  . losartan (COZAAR) 100 MG tablet Take 100 mg by mouth daily.    . megestrol (MEGACE) 40 MG tablet Take 1 tablet (40 mg total) by mouth 2 (two) times daily. Can increase to two tablets twice a day in the event of heavy bleeding 60 tablet 5  . Menthol, Topical Analgesic, (BIOFREEZE) 4 % GEL Apply topically.    . metolazone (ZAROXOLYN) 2.5 MG tablet Take by mouth. 2.5 mg daily x2 x week    . Probiotic Product (PROBIOTIC DAILY PO) Take by mouth.    . Zinc Oxide (DR SMITHS DIAPER RASH EX) Apply topically.    . Cholecalciferol 250 MCG (10000 UT) CAPS Take 10,000 Units by mouth  once a week. (Patient not taking: Reported on 11/06/2020)     No current facility-administered medications for this visit.     PHYSICAL EXAMINATION: ECOG PERFORMANCE STATUS: 1 - Symptomatic but completely ambulatory Vitals:   11/06/20 0943  BP: (!) 166/118  Pulse: 79  Resp: (!) 22  Temp: 98.5 F (36.9 C)  SpO2: 96%   Filed Weights   11/06/20 0943  Weight: (!) 455 lb 6.4 oz (206.6 kg)    Physical Exam Constitutional:      General: She is not in acute distress.    Appearance: She is obese.     Comments: Patient walks with a walker  HENT:     Head: Normocephalic and atraumatic.  Eyes:     General: No scleral icterus. Cardiovascular:     Rate and Rhythm: Normal rate and regular rhythm.     Heart sounds: Normal heart sounds.  Pulmonary:     Effort: Pulmonary effort is normal. No respiratory distress.     Breath sounds: No wheezing.  Abdominal:     General: Bowel sounds are normal. There is no distension.     Palpations: Abdomen is soft.  Musculoskeletal:        General: Normal range of motion.     Cervical back: Normal range of motion and neck supple.     Right lower leg: Edema present.     Left lower leg: Edema present.  Skin:    General: Skin is warm and dry.     Findings: No erythema or rash.  Neurological:     Mental Status: She is alert and oriented to person, place, and time. Mental status is at baseline.     Cranial Nerves: No cranial nerve deficit.     Coordination: Coordination normal.  Psychiatric:        Mood and Affect: Mood normal.     LABORATORY DATA:  I have reviewed the data as listed Lab Results  Component Value Date   WBC 7.0 05/08/2020   HGB 14.3 05/08/2020   HCT 45.2 05/08/2020   MCV 110.0 (H) 05/08/2020   PLT 187 05/08/2020   Recent Labs    05/08/20 1021  NA 134*  K 3.6  CL 93*  CO2 33*  GLUCOSE 116*  BUN 11  CREATININE 0.78  CALCIUM 8.8*  GFRNONAA >60  GFRAA >60   Iron/TIBC/Ferritin/ %Sat No results found for: IRON,  TIBC, FERRITIN, IRONPCTSAT    RADIOGRAPHIC STUDIES: I have personally reviewed the radiological images as listed and agreed with the findings in the report. No results found.    ASSESSMENT & PLAN:  1. Erythrocytosis   2. Macrocytosis   3. Morbid obesity with BMI of 70 and over, adult [facility])    Previous labs reviewed and discussed with patient. Macrocytosis  plan to check CBC, CMP, LDH, smear, reticulocyte panel, TSH, LDH, protein electrophoresis and light chain, folate, B12.   #Erythrocytosis, Part of today's blood work results were reviewed. Hemoglobin today is 16.1, HCT 50.8, MCV 109.2 We will also initiate erythrocytosis work-up.  Check carbon monoxide level, erythropoietin level, JAK2 with reflex to mutation, BCR ABL. Consider phlebotomy to keep HCT less than 50.  Morbid obesity, at risk of developing OSA which may contribute to above lab findings.  Pending above work-up.  Orders Placed This Encounter  Procedures  . Vitamin B12    Standing Status:   Future    Number of Occurrences:   1    Standing Expiration Date:   11/06/2021  . Folate    Standing Status:   Future    Number of Occurrences:   1    Standing Expiration Date:   11/06/2021  . CBC with Differential/Platelet    Standing Status:   Future    Number of Occurrences:   1    Standing Expiration Date:   11/06/2021  . Comprehensive metabolic panel    Standing Status:   Future    Number of Occurrences:   1    Standing Expiration Date:   11/06/2021  . Kappa/lambda light chains    Standing Status:   Future    Number of Occurrences:   1    Standing Expiration Date:   11/06/2021  .  Multiple Myeloma Panel (SPEP&IFE w/QIG)    Standing Status:   Future    Number of Occurrences:   1    Standing Expiration Date:   11/06/2021  . Retic Panel    Standing Status:   Future    Number of Occurrences:   1    Standing Expiration Date:   11/06/2021  . TSH    Standing Status:   Future    Number of Occurrences:   1     Standing Expiration Date:   11/06/2021  . Technologist smear review    Standing Status:   Future    Number of Occurrences:   1    Standing Expiration Date:   11/06/2021  . Lactate dehydrogenase    Standing Status:   Future    Number of Occurrences:   1    Standing Expiration Date:   11/06/2021    All questions were answered. The patient knows to call the clinic with any problems questions or concerns.  cc Baxter Hire, MD    Return of visit: 2 weeks or earlier if needed. Thank you for this kind referral and the opportunity to participate in the care of this patient. A copy of today's note is routed to referring provider    Earlie Server, MD, PhD Hematology Oncology [facility] at [facility] Pager- [phone] 11/06/2020

## 2020-11-06 NOTE — Progress Notes (Signed)
Pt here to establish care.

## 2020-11-06 NOTE — Telephone Encounter (Signed)
Per [place] from Dr. Cathie Hoops: her hb is 16, i need more labs. jak 2 mutation w reflex, erythropoitein leve, carbomonoxide level, bcrabl.   Contacted patient and she can come on Friday at 9:45 for additional labwork. Alvy Beal, please add lab appt.

## 2020-11-07 LAB — KAPPA/LAMBDA LIGHT CHAINS
Kappa free light chain: 38.2 mg/L — ABNORMAL HIGH (ref 3.3–19.4)
Kappa, lambda light chain ratio: 2.37 — ABNORMAL HIGH (ref 0.26–1.65)
Lambda free light chains: 16.1 mg/L (ref 5.7–26.3)

## 2020-11-08 LAB — MULTIPLE MYELOMA PANEL, SERUM
Albumin SerPl Elph-Mcnc: 3.8 g/dL (ref 2.9–4.4)
Albumin/Glob SerPl: 1.3 (ref 0.7–1.7)
Alpha 1: 0.1 g/dL (ref 0.0–0.4)
Alpha2 Glob SerPl Elph-Mcnc: 0.8 g/dL (ref 0.4–1.0)
B-Globulin SerPl Elph-Mcnc: 1.1 g/dL (ref 0.7–1.3)
Gamma Glob SerPl Elph-Mcnc: 1.1 g/dL (ref 0.4–1.8)
Globulin, Total: 3.1 g/dL (ref 2.2–3.9)
IgA: 518 mg/dL — ABNORMAL HIGH (ref 87–352)
IgG (Immunoglobin G), Serum: 1188 mg/dL (ref 586–1602)
IgM (Immunoglobulin M), Srm: 95 mg/dL (ref 26–217)
Total Protein ELP: 6.9 g/dL (ref 6.0–8.5)

## 2020-11-09 ENCOUNTER — Inpatient Hospital Stay: Payer: Medicaid Other

## 2020-11-13 ENCOUNTER — Inpatient Hospital Stay: Payer: Medicaid Other

## 2020-11-13 ENCOUNTER — Other Ambulatory Visit: Payer: Self-pay

## 2020-11-13 DIAGNOSIS — D751 Secondary polycythemia: Secondary | ICD-10-CM | POA: Diagnosis not present

## 2020-11-14 LAB — ERYTHROPOIETIN: Erythropoietin: 10.2 m[IU]/mL (ref 2.6–18.5)

## 2020-11-14 LAB — CARBON MONOXIDE, BLOOD (PERFORMED AT REF LAB): Carbon Monoxide, Blood: 3 % (ref 0.0–3.6)

## 2020-11-16 LAB — BCR-ABL1 FISH
Cells Analyzed: 200
Cells Counted: 200

## 2020-11-20 DIAGNOSIS — I5020 Unspecified systolic (congestive) heart failure: Secondary | ICD-10-CM | POA: Diagnosis not present

## 2020-11-20 DIAGNOSIS — M6281 Muscle weakness (generalized): Secondary | ICD-10-CM | POA: Diagnosis not present

## 2020-11-20 DIAGNOSIS — R32 Unspecified urinary incontinence: Secondary | ICD-10-CM | POA: Diagnosis not present

## 2020-11-20 DIAGNOSIS — R062 Wheezing: Secondary | ICD-10-CM | POA: Diagnosis not present

## 2020-11-21 DIAGNOSIS — I5020 Unspecified systolic (congestive) heart failure: Secondary | ICD-10-CM | POA: Diagnosis not present

## 2020-11-21 DIAGNOSIS — R062 Wheezing: Secondary | ICD-10-CM | POA: Diagnosis not present

## 2020-11-21 DIAGNOSIS — M6281 Muscle weakness (generalized): Secondary | ICD-10-CM | POA: Diagnosis not present

## 2020-11-26 ENCOUNTER — Inpatient Hospital Stay: Payer: Medicaid Other | Admitting: Oncology

## 2020-11-26 ENCOUNTER — Other Ambulatory Visit: Payer: Self-pay

## 2020-11-30 ENCOUNTER — Inpatient Hospital Stay: Payer: Medicaid Other | Admitting: Oncology

## 2020-11-30 NOTE — Progress Notes (Signed)
Pt here for follow up. No new concerns voiced.   

## 2020-12-06 ENCOUNTER — Inpatient Hospital Stay: Payer: Medicaid Other | Admitting: Oncology

## 2020-12-07 ENCOUNTER — Encounter: Payer: Self-pay | Admitting: Oncology

## 2020-12-07 ENCOUNTER — Inpatient Hospital Stay: Payer: Medicaid Other | Attending: Oncology | Admitting: Oncology

## 2020-12-07 DIAGNOSIS — D751 Secondary polycythemia: Secondary | ICD-10-CM

## 2020-12-07 DIAGNOSIS — D7589 Other specified diseases of blood and blood-forming organs: Secondary | ICD-10-CM | POA: Insufficient documentation

## 2020-12-07 LAB — JAK2 V617F, W REFLEX TO CALR/E12/MPL

## 2020-12-07 LAB — CALR + JAK2 E12-15 + MPL (REFLEXED)

## 2020-12-07 NOTE — Progress Notes (Signed)
Patient denies new problems/concerns today.   °

## 2020-12-07 NOTE — Progress Notes (Signed)
HEMATOLOGY-ONCOLOGY TeleHEALTH VISIT PROGRESS NOTE  I connected with [name] on @ENCDATE  @ at 11:30 AM EST by video enabled telemedicine visit and verified that I am speaking with the correct person using two identifiers. I discussed the limitations, risks, security and privacy concerns of performing an evaluation and management service by telemedicine and the availability of in-person appointments. The patient expressed understanding and agreed to proceed.   Other persons participating in the visit and their role in the encounter:  None  Patient's location: Home  Provider's location: office Chief Complaint: Follow-up for erythrocytosis   INTERVAL HISTORY [name] is a 59 y.o. female who has above history reviewed by me today presents for follow up visit for management of erythrocytosis Problems and complaints are listed below:  Patient has had blood work done during the visit.  She has no new complaints. She has sleep apnea and has CPAP machine.  She admits not using CPAP machine every day.  Review of Systems  Constitutional: Positive for fatigue. Negative for chills and fever.  HENT:   Negative for hearing loss and voice change.   Eyes: Negative for eye problems.  Respiratory: Negative for chest tightness and cough.   Cardiovascular: Negative for chest pain.  Gastrointestinal: Negative for abdominal distention, abdominal pain and blood in stool.  Endocrine: Negative for hot flashes.  Genitourinary: Negative for difficulty urinating and frequency.   Musculoskeletal: Negative for arthralgias.  Skin: Negative for itching and rash.  Neurological: Negative for extremity weakness.  Hematological: Negative for adenopathy.  Psychiatric/Behavioral: Negative for confusion.    Past Medical History:  Diagnosis Date  . Asthma 01/25/2018  . CHF (congestive heart failure) ([facility])   . Depression   . GERD (gastroesophageal reflux disease)   . Hypertension   . Lymphedema   . Morbid  obesity ([place])   . Sleep apnea    History reviewed. No pertinent surgical history.  Family History  Problem Relation Age of Onset  . CVA Father   . Hypertension Father   . Seizures Father   . Hypertension Mother   . Kidney disease Mother   . Cancer Paternal Grandmother     Social History   Socioeconomic History  . Marital status: Divorced    Spouse name: Not on file  . Number of children: 1  . Years of education: 79  . Highest education level: 12th grade  Occupational History  . Occupation: Disability  Tobacco Use  . Smoking status: Former Smoker    Years: 10.00    Types: Cigarettes    Quit date: 11/06/2013    Years since quitting: 7.0  . Smokeless tobacco: Never Used  . Tobacco comment: quit 7 years ago  Vaping Use  . Vaping Use: Never used  Substance and Sexual Activity  . Alcohol use: Yes    Alcohol/week: 1.0 standard drink    Types: 1 Standard drinks or equivalent per week    Comment: occasional alcohol use on weekend  . Drug use: No  . Sexual activity: Not Currently    Birth control/protection: Condom  Other Topics Concern  . Not on file  Social History Narrative   Lives by herself. Daughter checks on her. Ambulates with walker   Social Determinants of Health   Financial Resource Strain: Not on file  Food Insecurity: Not on file  Transportation Needs: Not on file  Physical Activity: Not on file  Stress: Not on file  Social Connections: Not on file  Intimate Partner Violence: Not on  file    Current Outpatient Medications on File Prior to Visit  Medication Sig Dispense Refill  . albuterol (PROVENTIL HFA;VENTOLIN HFA) 108 (90 Base) MCG/ACT inhaler Inhale 2 puffs into the lungs every 6 (six) hours as needed for wheezing or shortness of breath.    Marland Kitchen aspirin EC 81 MG tablet Take 1 tablet (81 mg total) by mouth daily. 90 tablet 3  . atenolol (TENORMIN) 50 MG tablet Take 1 tablet (50 mg total) by mouth 2 (two) times daily. 60 tablet 5  . bismuth subsalicylate  (PEPTO BISMOL) 262 MG/15ML suspension Take 30 mLs by mouth every 6 (six) hours as needed.    . calcium carbonate (TUMS - DOSED IN MG ELEMENTAL CALCIUM) 500 MG chewable tablet Chew 1 tablet by mouth daily as needed for indigestion or heartburn.    . Cholecalciferol 250 MCG (10000 UT) CAPS Take 10,000 Units by mouth once a week.    . Cyanocobalamin 1000 MCG CAPS Take by mouth daily.    . Dimethicone-Zinc Oxide 5-5 % CREA Apply 1 application topically as needed.    . furosemide (LASIX) 80 MG tablet Take 1 tablet (80 mg total) by mouth daily. 30 tablet 5  . losartan (COZAAR) 100 MG tablet Take 100 mg by mouth daily.    . megestrol (MEGACE) 40 MG tablet Take 1 tablet (40 mg total) by mouth 2 (two) times daily. Can increase to two tablets twice a day in the event of heavy bleeding 60 tablet 5  . Menthol, Topical Analgesic, (BIOFREEZE) 4 % GEL Apply topically.    . Probiotic Product (PROBIOTIC DAILY PO) Take by mouth.    . Zinc Oxide (DR SMITHS DIAPER RASH EX) Apply topically.    . metolazone (ZAROXOLYN) 2.5 MG tablet Take by mouth. 2.5 mg daily x2 x week (Patient not taking: Reported on 12/07/2020)     No current facility-administered medications on file prior to visit.    No Known Allergies     Observations/Objective: Today's Vitals   12/07/20 1102  PainSc: 0-No pain   There is no height or weight on file to calculate BMI.  Physical Exam Pulmonary:     Comments: She presents via nasal cannula oxygen Neurological:     Mental Status: She is alert.     CBC    Component Value Date/Time   WBC 6.1 11/06/2020 1011   RBC 4.53 11/06/2020 1011   RBC 4.65 11/06/2020 1011   HGB 16.1 (H) 11/06/2020 1011   HGB 11.3 (L) 02/13/2013 0607   HCT 50.8 (H) 11/06/2020 1011   HCT 33.3 (L) 02/13/2013 0607   PLT 280 11/06/2020 1011   PLT 234 02/13/2013 0607   MCV 109.2 (H) 11/06/2020 1011   MCV 99 02/13/2013 0607   MCH 34.6 (H) 11/06/2020 1011   MCHC 31.7 11/06/2020 1011   RDW 15.0 11/06/2020 1011    RDW 15.1 (H) 02/13/2013 0607   LYMPHSABS 2.4 11/06/2020 1011   LYMPHSABS 1.8 02/13/2013 0607   MONOABS 0.7 11/06/2020 1011   MONOABS 1.4 (H) 02/13/2013 0607   EOSABS 0.1 11/06/2020 1011   EOSABS 0.2 02/13/2013 0607   BASOSABS 0.0 11/06/2020 1011   BASOSABS 0.1 02/13/2013 0607    CMP     Component Value Date/Time   NA 139 11/06/2020 1011   NA 136 02/13/2013 0607   K 4.0 11/06/2020 1011   K 3.6 02/13/2013 0607   CL 100 11/06/2020 1011   CL 102 02/13/2013 0607   CO2 29 11/06/2020 1011  CO2 27 02/13/2013 0607   GLUCOSE 121 (H) 11/06/2020 1011   GLUCOSE 100 (H) 02/13/2013 0607   BUN 11 11/06/2020 1011   BUN 9 02/13/2013 0607   CREATININE 0.79 11/06/2020 1011   CREATININE 0.93 02/17/2013 0500   CALCIUM 9.2 11/06/2020 1011   CALCIUM 8.4 (L) 02/13/2013 0607   PROT 7.7 11/06/2020 1011   PROT 7.5 02/12/2013 1124   ALBUMIN 3.9 11/06/2020 1011   ALBUMIN 2.4 (L) 02/12/2013 1124   AST 21 11/06/2020 1011   AST 18 02/12/2013 1124   ALT 13 11/06/2020 1011   ALT 17 02/12/2013 1124   ALKPHOS 64 11/06/2020 1011   ALKPHOS 82 02/12/2013 1124   BILITOT 1.1 11/06/2020 1011   BILITOT 0.7 02/12/2013 1124   GFRNONAA >60 11/06/2020 1011   GFRNONAA >60 02/17/2013 0500   GFRAA >60 05/08/2020 1021   GFRAA >60 02/17/2013 0500     Assessment and Plan: 1. Erythrocytosis   2. Macrocytosis     Labs are reviewed and discussed with patient. JAK2 V617F mutation negative, with reflex to other mutations CALR, MPL, JAK 2 Ex 12-15 mutations negative.  Negative for BCR ABL 1 FISH.  Normal carbon monoxide level.  Normal erythropoietin level I discussed with the patient that this is less likely primary erythrocytosis.  Probably due to sleep apnea and noncompliance of CPAP machine.  I discussed with her about the importance of using CPAP machine and she is motivated.  Goal of HCT is 50.  She is slightly above the goal.  Given that she is going to increase her compliance of CPAP, anticipate hemoglobin will  decrease once nighttime oxygenation is improved. I will hold off immediate phlebotomy at this point.  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  Normal folate and vitamin B12.  Normal SPEP. Monitor for now.  If persistent despite improvement of hemoglobin after increase compliance of CPAP, will consider bone marrow biopsy for further evaluation. Follow Up Instructions: 4 weeks  I discussed the assessment and treatment plan with the patient. The patient was provided an opportunity to ask questions and all were answered. The patient agreed with the plan and demonstrated an understanding of the instructions.  The patient was advised to call back or seek an in-person evaluation if the symptoms worsen or if the condition fails to improve as anticipated.    Earlie Server, MD 12/07/2020 9:16 PM

## 2020-12-23 DIAGNOSIS — R32 Unspecified urinary incontinence: Secondary | ICD-10-CM | POA: Diagnosis not present

## 2020-12-23 DIAGNOSIS — R062 Wheezing: Secondary | ICD-10-CM | POA: Diagnosis not present

## 2020-12-23 DIAGNOSIS — M6281 Muscle weakness (generalized): Secondary | ICD-10-CM | POA: Diagnosis not present

## 2020-12-23 DIAGNOSIS — I5020 Unspecified systolic (congestive) heart failure: Secondary | ICD-10-CM | POA: Diagnosis not present

## 2021-01-03 ENCOUNTER — Telehealth: Payer: Self-pay | Admitting: Oncology

## 2021-01-03 NOTE — Telephone Encounter (Signed)
Pt. Called and stated that she has "a very bad cold" and will not be able to keep her appt. On 1/18. She does not want to R/S at this time-she will call back on Monday.

## 2021-01-07 NOTE — Telephone Encounter (Signed)
Please cancel appts, per phone note

## 2021-01-07 NOTE — Telephone Encounter (Signed)
FYI... She does not want to R/S at this time-she will call back on Monday.

## 2021-01-07 NOTE — Telephone Encounter (Signed)
Please contact pt to r/s  ° °

## 2021-01-07 NOTE — Telephone Encounter (Signed)
Pts 01/08/21 appts has been cx per pts request. A detailed message was left on pts VM to contact the office to have appts R/S once she's feeling better.

## 2021-01-08 ENCOUNTER — Inpatient Hospital Stay: Payer: Medicaid Other

## 2021-01-08 ENCOUNTER — Inpatient Hospital Stay: Payer: Medicaid Other | Admitting: Oncology

## 2021-01-22 DIAGNOSIS — I5020 Unspecified systolic (congestive) heart failure: Secondary | ICD-10-CM | POA: Diagnosis not present

## 2021-01-22 DIAGNOSIS — R062 Wheezing: Secondary | ICD-10-CM | POA: Diagnosis not present

## 2021-01-22 DIAGNOSIS — M6281 Muscle weakness (generalized): Secondary | ICD-10-CM | POA: Diagnosis not present

## 2021-02-12 ENCOUNTER — Ambulatory Visit: Payer: Medicaid Other | Admitting: Family

## 2021-02-18 DIAGNOSIS — R739 Hyperglycemia, unspecified: Secondary | ICD-10-CM | POA: Diagnosis not present

## 2021-02-18 DIAGNOSIS — I10 Essential (primary) hypertension: Secondary | ICD-10-CM | POA: Diagnosis not present

## 2021-02-22 ENCOUNTER — Ambulatory Visit: Payer: Medicaid Other | Admitting: Family

## 2021-02-25 ENCOUNTER — Encounter: Payer: Self-pay | Admitting: Family

## 2021-02-25 ENCOUNTER — Other Ambulatory Visit: Payer: Self-pay

## 2021-02-25 ENCOUNTER — Ambulatory Visit: Payer: Medicaid Other | Attending: Family | Admitting: Family

## 2021-02-25 VITALS — BP 162/96 | HR 80 | Resp 20 | Ht 62.0 in | Wt >= 6400 oz

## 2021-02-25 DIAGNOSIS — I890 Lymphedema, not elsewhere classified: Secondary | ICD-10-CM | POA: Diagnosis not present

## 2021-02-25 DIAGNOSIS — I10 Essential (primary) hypertension: Secondary | ICD-10-CM | POA: Diagnosis not present

## 2021-02-25 DIAGNOSIS — G4733 Obstructive sleep apnea (adult) (pediatric): Secondary | ICD-10-CM | POA: Diagnosis not present

## 2021-02-25 DIAGNOSIS — Z7982 Long term (current) use of aspirin: Secondary | ICD-10-CM | POA: Insufficient documentation

## 2021-02-25 DIAGNOSIS — Z0000 Encounter for general adult medical examination without abnormal findings: Secondary | ICD-10-CM | POA: Diagnosis not present

## 2021-02-25 DIAGNOSIS — I509 Heart failure, unspecified: Secondary | ICD-10-CM | POA: Insufficient documentation

## 2021-02-25 DIAGNOSIS — I5032 Chronic diastolic (congestive) heart failure: Secondary | ICD-10-CM

## 2021-02-25 DIAGNOSIS — Z87891 Personal history of nicotine dependence: Secondary | ICD-10-CM | POA: Insufficient documentation

## 2021-02-25 DIAGNOSIS — I110 Hypertensive heart disease with heart failure: Secondary | ICD-10-CM | POA: Diagnosis not present

## 2021-02-25 DIAGNOSIS — R739 Hyperglycemia, unspecified: Secondary | ICD-10-CM | POA: Diagnosis not present

## 2021-02-25 NOTE — Progress Notes (Signed)
Patient ID: [name], female    DOB: [birth year], 60 y.o.   MRN: [id number]  HPI  [name] is a 60 y/o female with a history of obstructive sleep apnea, morbid obesity, lymphedema, HTN, GERD, depression and chronic heart failure.  Echo report from 03/11/18 reviewed and showed an EF of 55-60%. Echo report done 03/11/18 reviewed and showed an EF of 55-60%. Echo done 05/07/16 showed an EF of 60-65%. No valvular regurgitation. Technically difficult study however due to body habitus and patient was in a wheelchair.   Has not been admitted or been in the ED in the last 6 months.   She presents today for a follow-up visit although hasn't been seen since 2019. She presents with a chief complaint of minimal fatigue upon moderate exertion. She describes this as chronic in nature having been present for several years. She has associated shortness of breath, intermittent chest pain, chronic lymphedema and chronic pain along with this. She denies any difficulty sleeping, dizziness, abdominal distention, palpitations or cough.   Past Medical History:  Diagnosis Date  . Asthma 01/25/2018  . CHF (congestive heart failure) ([facility])   . Depression   . GERD (gastroesophageal reflux disease)   . Hypertension   . Lymphedema   . Morbid obesity ([facility])   . Sleep apnea    No past surgical history on file.  Family History  Problem Relation Age of Onset  . CVA Father   . Hypertension Father   . Seizures Father   . Hypertension Mother   . Kidney disease Mother   . Cancer Paternal Grandmother    Social History   Tobacco Use  . Smoking status: Former Smoker    Years: 10.00    Types: Cigarettes    Quit date: 11/06/2013    Years since quitting: 7.3  . Smokeless tobacco: Never Used  . Tobacco comment: quit 7 years ago  Substance Use Topics  . Alcohol use: Yes    Alcohol/week: 1.0 standard drink    Types: 1 Standard drinks or equivalent per week    Comment: occasional alcohol use on weekend   No Known  Allergies  Prior to Admission medications   Medication Sig Start Date End Date Taking? Authorizing Provider  albuterol (PROVENTIL HFA;VENTOLIN HFA) 108 (90 Base) MCG/ACT inhaler Inhale 2 puffs into the lungs every 6 (six) hours as needed for wheezing or shortness of breath.   Yes [provider]  aspirin EC 81 MG tablet Take 1 tablet (81 mg total) by mouth daily. 07/22/17  Yes End, Cristal Deer, MD  atenolol (TENORMIN) 50 MG tablet Take 1 tablet (50 mg total) by mouth 2 (two) times daily. 02/13/17  Yes Conley Pawling, Inetta Fermo A, FNP  bismuth subsalicylate (PEPTO BISMOL) 262 MG/15ML suspension Take 30 mLs by mouth every 6 (six) hours as needed.   Yes [provider]  calcium carbonate (TUMS - DOSED IN MG ELEMENTAL CALCIUM) 500 MG chewable tablet Chew 1 tablet by mouth daily as needed for indigestion or heartburn.   Yes [provider]  Cholecalciferol 250 MCG (10000 UT) CAPS Take 10,000 Units by mouth once a week.   Yes [provider]  Cyanocobalamin 1000 MCG CAPS Take by mouth daily.   Yes [provider]  Dimethicone-Zinc Oxide 5-5 % CREA Apply 1 application topically as needed.   Yes [provider]  furosemide (LASIX) 80 MG tablet Take 1 tablet (80 mg total) by mouth daily. 02/13/17  Yes Delma Freeze, FNP  losartan (  COZAAR) 100 MG tablet Take 100 mg by mouth daily. 10/14/20  Yes [provider]  Menthol, Topical Analgesic, (BIOFREEZE) 4 % GEL Apply topically.   Yes [provider]  metolazone (ZAROXOLYN) 2.5 MG tablet Take by mouth. 2.5 mg daily x2 x week 10/26/20  Yes [provider]  Probiotic Product (PROBIOTIC DAILY PO) Take by mouth.   Yes [provider]     Review of Systems  Constitutional: Positive for fatigue. Negative for appetite change.  HENT: Negative for congestion, postnasal drip and sore throat.   Eyes: Negative.   Respiratory: Positive for shortness of breath. Negative for cough, chest tightness  and wheezing.   Cardiovascular: Positive for chest pain (right side last couple of weeks) and leg swelling (minimal). Negative for palpitations.  Gastrointestinal: Negative for abdominal distention and abdominal pain.  Endocrine: Negative.   Genitourinary: Negative.   Musculoskeletal: Positive for arthralgias (left hip) and back pain.  Skin: Negative.   Allergic/Immunologic: Negative.   Neurological: Negative for dizziness and light-headedness.  Hematological: Negative for adenopathy. Does not bruise/bleed easily.  Psychiatric/Behavioral: Negative for dysphoric mood, sleep disturbance (sleeping on 1 pillow; wears CPAP at bedtime ) and suicidal ideas. The patient is not nervous/anxious.    Vitals:   02/25/21 1013  BP: (!) 162/96  Pulse: 80  Resp: 20  SpO2: 94%  Weight: (!) 455 lb (206.4 kg)  Height: 5\' 2"  (1.575 m)   Wt Readings from Last 3 Encounters:  02/25/21 (!) 455 lb (206.4 kg)  11/06/20 (!) 455 lb 6.4 oz (206.6 kg)  05/22/20 (!) 482 lb 8 oz (218.9 kg)   Lab Results  Component Value Date   CREATININE 0.79 11/06/2020   CREATININE 0.78 05/08/2020   CREATININE 0.98 07/06/2018   Physical Exam Vitals and nursing note reviewed.  Constitutional:      Appearance: She is well-developed.  HENT:     Head: Normocephalic and atraumatic.  Neck:     Vascular: No JVD.  Cardiovascular:     Rate and Rhythm: Normal rate and regular rhythm.  Pulmonary:     Effort: Pulmonary effort is normal.     Breath sounds: No wheezing or rales.  Abdominal:     General: There is no distension.     Palpations: Abdomen is soft.     Tenderness: There is no abdominal tenderness.  Musculoskeletal:        General: No tenderness.     Cervical back: Normal range of motion and neck supple.     Right lower leg: Edema (lymphedema) present.     Left lower leg: Edema (lymphedema) present.  Skin:    General: Skin is warm and dry.  Neurological:     Mental Status: She is alert and oriented to person,  place, and time.  Psychiatric:        Behavior: Behavior normal.        Thought Content: Thought content normal.    Assessment & Plan:  1: Chronic heart failure with preserved ejection fraction- - NYHA class II - difficult to assess fluid status due to patient's weight - weighing daily when her aide is there. Reminded to call for an overnight weight gain of >2 pounds or a weekly weight gain of >5 pounds - had telemedicine visit with cardiology ([facility]) 04/04/2019 - admits to not getting any exercise due to chronic bilateral knee pain & now left hip pain; does use a walker when she's up walking around - not adding salt to her food -  drinks ~ 64 ounces of water daily - has gotten her flu vaccine for this season  2: HTN- - BP mildly elevated today but she was anxious about coming today because of needing a bariatric wheelchair; she says the wheelchair isn't very comfortable either - saw PCP Letitia Libra) 10/24/20; returns to him once she leaves here - BMP 02/18/21 reviewed and shows sodium 141, potassium 4.1, Cr 0.8 and GFR 88  3: Obstructive sleep apnea- - wears oxygen at 2L during the day as needed - wearing CPAP at bedtime - saw pulmonologist Meredeth Ide) 08/12/18; she needs to schedule f/u appointment with him  4: Lymphedema- - Stage 3 lymphedema - unable to wear compression socks due to body habitus - had gotten a lift chair but says that it was so uncomfortable that she sent it back   Patient did not bring her medications nor a list. Each medication was verbally reviewed with the patient and she was encouraged to bring the bottles to every visit to confirm accuracy of list.   Return in 1 year or sooner for any questions/questions/problems before then.

## 2021-04-13 ENCOUNTER — Other Ambulatory Visit: Payer: Self-pay | Admitting: Internal Medicine

## 2021-04-21 DIAGNOSIS — I5020 Unspecified systolic (congestive) heart failure: Secondary | ICD-10-CM | POA: Diagnosis not present

## 2021-04-21 DIAGNOSIS — R062 Wheezing: Secondary | ICD-10-CM | POA: Diagnosis not present

## 2021-04-21 DIAGNOSIS — M6281 Muscle weakness (generalized): Secondary | ICD-10-CM | POA: Diagnosis not present

## 2021-04-23 DIAGNOSIS — M6281 Muscle weakness (generalized): Secondary | ICD-10-CM | POA: Diagnosis not present

## 2021-04-23 DIAGNOSIS — I5020 Unspecified systolic (congestive) heart failure: Secondary | ICD-10-CM | POA: Diagnosis not present

## 2021-04-23 DIAGNOSIS — R062 Wheezing: Secondary | ICD-10-CM | POA: Diagnosis not present

## 2021-04-23 DIAGNOSIS — R32 Unspecified urinary incontinence: Secondary | ICD-10-CM | POA: Diagnosis not present

## 2021-05-22 DIAGNOSIS — M6281 Muscle weakness (generalized): Secondary | ICD-10-CM | POA: Diagnosis not present

## 2021-05-22 DIAGNOSIS — R062 Wheezing: Secondary | ICD-10-CM | POA: Diagnosis not present

## 2021-05-22 DIAGNOSIS — I5020 Unspecified systolic (congestive) heart failure: Secondary | ICD-10-CM | POA: Diagnosis not present

## 2021-06-06 DIAGNOSIS — R32 Unspecified urinary incontinence: Secondary | ICD-10-CM | POA: Diagnosis not present

## 2021-06-06 DIAGNOSIS — M6281 Muscle weakness (generalized): Secondary | ICD-10-CM | POA: Diagnosis not present

## 2021-06-06 DIAGNOSIS — I5020 Unspecified systolic (congestive) heart failure: Secondary | ICD-10-CM | POA: Diagnosis not present

## 2021-06-06 DIAGNOSIS — R062 Wheezing: Secondary | ICD-10-CM | POA: Diagnosis not present

## 2021-06-21 DIAGNOSIS — I5020 Unspecified systolic (congestive) heart failure: Secondary | ICD-10-CM | POA: Diagnosis not present

## 2021-06-21 DIAGNOSIS — R062 Wheezing: Secondary | ICD-10-CM | POA: Diagnosis not present

## 2021-06-21 DIAGNOSIS — M6281 Muscle weakness (generalized): Secondary | ICD-10-CM | POA: Diagnosis not present

## 2021-06-23 ENCOUNTER — Emergency Department: Payer: Medicaid Other

## 2021-06-23 ENCOUNTER — Observation Stay: Payer: Medicaid Other

## 2021-06-23 ENCOUNTER — Encounter: Payer: Self-pay | Admitting: Emergency Medicine

## 2021-06-23 ENCOUNTER — Observation Stay
Admission: EM | Admit: 2021-06-23 | Discharge: 2021-06-24 | Disposition: A | Discharge status: Home / Self Care | Payer: Medicaid Other | Attending: Internal Medicine | Admitting: Internal Medicine

## 2021-06-23 ENCOUNTER — Other Ambulatory Visit: Payer: Self-pay

## 2021-06-23 DIAGNOSIS — R319 Hematuria, unspecified: Secondary | ICD-10-CM | POA: Diagnosis not present

## 2021-06-23 DIAGNOSIS — I5032 Chronic diastolic (congestive) heart failure: Secondary | ICD-10-CM | POA: Insufficient documentation

## 2021-06-23 DIAGNOSIS — Z7982 Long term (current) use of aspirin: Secondary | ICD-10-CM | POA: Diagnosis not present

## 2021-06-23 DIAGNOSIS — H5500 Unspecified nystagmus: Secondary | ICD-10-CM

## 2021-06-23 DIAGNOSIS — Z79899 Other long term (current) drug therapy: Secondary | ICD-10-CM | POA: Insufficient documentation

## 2021-06-23 DIAGNOSIS — Z87891 Personal history of nicotine dependence: Secondary | ICD-10-CM | POA: Insufficient documentation

## 2021-06-23 DIAGNOSIS — I110 Hypertensive heart disease with heart failure: Secondary | ICD-10-CM | POA: Insufficient documentation

## 2021-06-23 DIAGNOSIS — I639 Cerebral infarction, unspecified: Principal | ICD-10-CM | POA: Insufficient documentation

## 2021-06-23 DIAGNOSIS — N390 Urinary tract infection, site not specified: Secondary | ICD-10-CM | POA: Diagnosis not present

## 2021-06-23 DIAGNOSIS — R0902 Hypoxemia: Secondary | ICD-10-CM | POA: Diagnosis not present

## 2021-06-23 DIAGNOSIS — J45909 Unspecified asthma, uncomplicated: Secondary | ICD-10-CM | POA: Diagnosis not present

## 2021-06-23 DIAGNOSIS — R112 Nausea with vomiting, unspecified: Secondary | ICD-10-CM | POA: Diagnosis not present

## 2021-06-23 DIAGNOSIS — R42 Dizziness and giddiness: Secondary | ICD-10-CM

## 2021-06-23 DIAGNOSIS — I10 Essential (primary) hypertension: Secondary | ICD-10-CM | POA: Diagnosis not present

## 2021-06-23 DIAGNOSIS — R1111 Vomiting without nausea: Secondary | ICD-10-CM | POA: Diagnosis not present

## 2021-06-23 LAB — BASIC METABOLIC PANEL
Anion gap: 6 (ref 5–15)
BUN: 16 mg/dL (ref 8–23)
CO2: 33 mmol/L — ABNORMAL HIGH (ref 22–32)
Calcium: 9 mg/dL (ref 8.9–10.3)
Chloride: 99 mmol/L (ref 98–111)
Creatinine, Ser: 0.69 mg/dL (ref 0.44–1.00)
GFR, Estimated: >60 mL/min (ref >60)
Glucose, Bld: 121 mg/dL — ABNORMAL HIGH (ref 70–99)
Potassium: 4.7 mmol/L (ref 3.5–5.1)
Sodium: 138 mmol/L (ref 135–145)

## 2021-06-23 LAB — URINALYSIS, COMPLETE (UACMP) WITH MICROSCOPIC
Bilirubin Urine: NEGATIVE
Glucose, UA: NEGATIVE mg/dL
Hgb urine dipstick: NEGATIVE
Ketones, ur: 5 mg/dL — AB
Nitrite: POSITIVE — AB
Protein, ur: 30 mg/dL — AB
RBC / HPF: >50 RBC/hpf — ABNORMAL HIGH (ref 0–5)
Specific Gravity, Urine: 1.024 (ref 1.005–1.030)
pH: 7 (ref 5.0–8.0)

## 2021-06-23 LAB — CBC
HCT: 47 % — ABNORMAL HIGH (ref 36.0–46.0)
Hemoglobin: 15.1 g/dL — ABNORMAL HIGH (ref 12.0–15.0)
MCH: 34.8 pg — ABNORMAL HIGH (ref 26.0–34.0)
MCHC: 32.1 g/dL (ref 30.0–36.0)
MCV: 108.3 fL — ABNORMAL HIGH (ref 80.0–100.0)
Platelets: 184 10*3/uL (ref 150–400)
RBC: 4.34 MIL/uL (ref 3.87–5.11)
RDW: 13 % (ref 11.5–15.5)
WBC: 7.2 10*3/uL (ref 4.0–10.5)
nRBC: 0 % (ref 0.0–0.2)

## 2021-06-23 MED ORDER — SODIUM CHLORIDE 0.9 % IV SOLN: 2.0000 g | Freq: Once | INTRAVENOUS | Status: DC

## 2021-06-23 MED ORDER — ENOXAPARIN SODIUM 100 MG/ML IJ SOSY
0.5000 mg/kg | PREFILLED_SYRINGE | INTRAMUSCULAR | Status: DC
  Administered 2021-06-24: 100 mg via SUBCUTANEOUS
  Filled 2021-06-23 (×2): qty 1

## 2021-06-23 MED ORDER — ACETAMINOPHEN 160 MG/5ML PO SOLN
650.0000 mg | ORAL | Status: DC | PRN
  Filled 2021-06-23: qty 20.3

## 2021-06-23 MED ORDER — VITAMIN D3 25 MCG (1000 UNIT) PO TABS: 10000.0000 [IU] | ORAL_TABLET | ORAL | Status: DC

## 2021-06-23 MED ORDER — SODIUM CHLORIDE 0.9 % IV SOLN
1.0000 g | INTRAVENOUS | Status: DC
  Administered 2021-06-24: 1 g via INTRAVENOUS
  Filled 2021-06-23: qty 10

## 2021-06-23 MED ORDER — LOSARTAN POTASSIUM 50 MG PO TABS
100.0000 mg | ORAL_TABLET | Freq: Every day | ORAL | Status: DC
  Administered 2021-06-24: 100 mg via ORAL
  Filled 2021-06-23: qty 2

## 2021-06-23 MED ORDER — ATENOLOL 25 MG PO TABS
50.0000 mg | ORAL_TABLET | Freq: Two times a day (BID) | ORAL | Status: DC
  Administered 2021-06-24: 50 mg via ORAL
  Filled 2021-06-23: qty 2

## 2021-06-23 MED ORDER — METOLAZONE 2.5 MG PO TABS
2.5000 mg | ORAL_TABLET | ORAL | Status: DC
  Administered 2021-06-24: 2.5 mg via ORAL
  Filled 2021-06-23: qty 1

## 2021-06-23 MED ORDER — BISMUTH SUBSALICYLATE 262 MG/15ML PO SUSP
30.0000 mL | Freq: Four times a day (QID) | ORAL | Status: DC | PRN
  Filled 2021-06-23: qty 118

## 2021-06-23 MED ORDER — SENNOSIDES-DOCUSATE SODIUM 8.6-50 MG PO TABS: 1.0000 | ORAL_TABLET | Freq: Every evening | ORAL | Status: DC | PRN

## 2021-06-23 MED ORDER — ACETAMINOPHEN 325 MG PO TABS
650.0000 mg | ORAL_TABLET | ORAL | Status: DC | PRN
  Administered 2021-06-24: 650 mg via ORAL
  Filled 2021-06-23: qty 2

## 2021-06-23 MED ORDER — ASPIRIN EC 81 MG PO TBEC
81.0000 mg | DELAYED_RELEASE_TABLET | Freq: Every day | ORAL | Status: DC
  Administered 2021-06-24: 81 mg via ORAL
  Filled 2021-06-23: qty 1

## 2021-06-23 MED ORDER — ONDANSETRON HCL 4 MG/2ML IJ SOLN
4.0000 mg | INTRAMUSCULAR | Status: DC | PRN
  Administered 2021-06-24: 4 mg via INTRAVENOUS
  Filled 2021-06-23: qty 2

## 2021-06-23 MED ORDER — STROKE: EARLY STAGES OF RECOVERY BOOK: Freq: Once | Status: DC

## 2021-06-23 MED ORDER — TRAZODONE HCL 50 MG PO TABS: 25.0000 mg | ORAL_TABLET | Freq: Every evening | ORAL | Status: DC | PRN

## 2021-06-23 MED ORDER — CLOPIDOGREL BISULFATE 75 MG PO TABS
75.0000 mg | ORAL_TABLET | Freq: Every day | ORAL | Status: DC
  Administered 2021-06-24: 75 mg via ORAL
  Filled 2021-06-23: qty 1

## 2021-06-23 MED ORDER — ACETAMINOPHEN 650 MG RE SUPP: 650.0000 mg | RECTAL | Status: DC | PRN

## 2021-06-23 MED ORDER — CALCIUM CARBONATE ANTACID 500 MG PO CHEW
1.0000 | CHEWABLE_TABLET | Freq: Every day | ORAL | Status: DC | PRN
  Administered 2021-06-24: 200 mg via ORAL
  Filled 2021-06-23: qty 1

## 2021-06-23 MED ORDER — MECLIZINE HCL 25 MG PO TABS
12.5000 mg | ORAL_TABLET | Freq: Once | ORAL | Status: AC
  Administered 2021-06-23: 12.5 mg via ORAL
  Filled 2021-06-23: qty 1

## 2021-06-23 MED ORDER — VITAMIN B-12 1000 MCG PO TABS
ORAL_TABLET | Freq: Every day | ORAL | Status: DC
  Administered 2021-06-24: 1000 ug via ORAL
  Filled 2021-06-23: qty 1

## 2021-06-23 MED ORDER — SODIUM CHLORIDE 0.9 % IV SOLN: INTRAVENOUS | Status: DC

## 2021-06-23 MED ORDER — FUROSEMIDE 40 MG PO TABS
80.0000 mg | ORAL_TABLET | Freq: Every day | ORAL | Status: DC
  Administered 2021-06-24: 80 mg via ORAL
  Filled 2021-06-23: qty 2

## 2021-06-23 MED ORDER — ALBUTEROL SULFATE (2.5 MG/3ML) 0.083% IN NEBU: 3.0000 mL | INHALATION_SOLUTION | Freq: Four times a day (QID) | RESPIRATORY_TRACT | Status: DC | PRN

## 2021-06-23 NOTE — ED Notes (Signed)
RT called for continuous CPAP. Will call back when pt returns from [place]

## 2021-06-23 NOTE — ED Provider Notes (Signed)
[facility] Emergency Department Provider Note  ____________________________________________   Event Date/Time   First MD Initiated Contact with Patient 06/23/21 2016     (approximate)  I have reviewed the triage vital signs and the nursing notes.   HISTORY  Chief Complaint Dizziness    HPI [name]. Pitz is a 60 y.o. female with past medical history of hypertension, morbid obesity, lymphedema, here with dizziness.  The patient states that yesterday, she developed acute onset of dizziness and room spinning.  Her symptoms resolved.  This afternoon, around noon to 1 PM, patient developed acute onset of recurrent dizziness and weakness.  She states she feels like the room is spinning.  She has had difficulty ambulating.  She lives alone.  She has also had some generalized weakness over the last several days, but not the dizziness.  She has never had vertigo.  Denies any ear ringing.  No double vision.  No focal numbness or weakness.  No other complaints.    Past Medical History:  Diagnosis Date   Asthma 01/25/2018   CHF (congestive heart failure) (HCC)    Depression    GERD (gastroesophageal reflux disease)    Hypertension    Lymphedema    Morbid obesity (HCC)    Sleep apnea     Patient Active Problem List   Diagnosis Date Noted   CVA (cerebral vascular accident) (HCC) 06/23/2021   Macrocytosis 12/07/2020   Erythrocytosis 12/07/2020   Depression 11/12/2017   (HFpEF) heart failure with preserved ejection fraction (HCC) 07/22/2017   Chronic diastolic heart failure (HCC) 11/30/2015   Benign essential HTN 11/30/2015   Obstructive sleep apnea 11/30/2015   Lymphedema of both lower extremities 11/30/2015   Cellulitis 11/01/2015    History reviewed. No pertinent surgical history.  Prior to Admission medications   Medication Sig Start Date End Date Taking? Authorizing Provider  albuterol (PROVENTIL HFA;VENTOLIN HFA) 108 (90 Base) MCG/ACT inhaler  Inhale 2 puffs into the lungs every 6 (six) hours as needed for wheezing or shortness of breath.    [provider]  aspirin EC 81 MG tablet Take 1 tablet (81 mg total) by mouth daily. 07/22/17   End, Cristal Deer, MD  atenolol (TENORMIN) 50 MG tablet Take 1 tablet (50 mg total) by mouth 2 (two) times daily. 02/13/17   Delma Freeze, FNP  bismuth subsalicylate (PEPTO BISMOL) 262 MG/15ML suspension Take 30 mLs by mouth every 6 (six) hours as needed.    [provider]  calcium carbonate (TUMS - DOSED IN MG ELEMENTAL CALCIUM) 500 MG chewable tablet Chew 1 tablet by mouth daily as needed for indigestion or heartburn.    [provider]  Cholecalciferol 250 MCG (10000 UT) CAPS Take 10,000 Units by mouth once a week.    [provider]  Cyanocobalamin 1000 MCG CAPS Take by mouth daily.    [provider]  Dimethicone-Zinc Oxide 5-5 % CREA Apply 1 application topically as needed.    [provider]  furosemide (LASIX) 80 MG tablet Take 1 tablet (80 mg total) by mouth daily. 02/13/17   Delma Freeze, FNP  losartan (COZAAR) 100 MG tablet Take 100 mg by mouth daily. 10/14/20   [provider]  Menthol, Topical Analgesic, (BIOFREEZE) 4 % GEL Apply topically.    [provider]  metolazone (ZAROXOLYN) 2.5 MG tablet Take by mouth. 2.5 mg daily x2 x week 10/26/20   [provider]  Probiotic Product (PROBIOTIC DAILY PO) Take by mouth.  [provider]    Allergies Patient has no known allergies.  Family History  Problem Relation Age of Onset   CVA Father    Hypertension Father    Seizures Father    Hypertension Mother    Kidney disease Mother    Cancer Paternal Grandmother     Social History Social History   Tobacco Use   Smoking status: Former    Years: 10.00    Pack years: 0.00    Types: Cigarettes    Quit date: 11/06/2013    Years since quitting: 7.6   Smokeless tobacco: Never   Tobacco comments:     quit 7 years ago  Vaping Use   Vaping Use: Never used  Substance Use Topics   Alcohol use: Yes    Alcohol/week: 1.0 standard drink    Types: 1 Standard drinks or equivalent per week    Comment: occasional alcohol use on weekend   Drug use: No    Review of Systems  Review of Systems  Constitutional:  Positive for fatigue. Negative for fever.  HENT:  Negative for congestion and sore throat.   Eyes:  Negative for visual disturbance.  Respiratory:  Negative for cough and shortness of breath.   Cardiovascular:  Negative for chest pain.  Gastrointestinal:  Negative for abdominal pain, diarrhea, nausea and vomiting.  Genitourinary:  Negative for flank pain.  Musculoskeletal:  Positive for gait problem. Negative for back pain and neck pain.  Skin:  Negative for rash and wound.  Neurological:  Positive for dizziness and weakness.  All other systems reviewed and are negative.   ____________________________________________  PHYSICAL EXAM:      VITAL SIGNS: ED Triage Vitals  Enc Vitals Group     BP 06/23/21 1459 (!) 148/69     Pulse Rate 06/23/21 1459 63     Resp 06/23/21 1459 20     Temp 06/23/21 1459 97.8 F (36.6 C)     Temp Source 06/23/21 1459 Oral     SpO2 06/23/21 1459 92 %     Weight 06/23/21 1502 (!) 440 lb (199.6 kg)     Height 06/23/21 1502 5\' 2"  (1.575 m)     Head Circumference --      Peak Flow --      Pain Score 06/23/21 1502 0     Pain Loc --      Pain Edu? --      Excl. in GC? --      Physical Exam Vitals and nursing note reviewed.  Constitutional:      General: She is not in acute distress.    Appearance: She is well-developed.  HENT:     Head: Normocephalic and atraumatic.  Eyes:     Conjunctiva/sclera: Conjunctivae normal.  Cardiovascular:     Rate and Rhythm: Normal rate and regular rhythm.     Heart sounds: Normal heart sounds. No murmur heard.   No friction rub.  Pulmonary:     Effort: Pulmonary effort is normal. No respiratory distress.      Breath sounds: Normal breath sounds. No wheezing or rales.  Abdominal:     General: There is no distension.     Palpations: Abdomen is soft.     Tenderness: There is no abdominal tenderness.  Musculoskeletal:     Cervical back: Neck supple.  Skin:    General: Skin is warm.     Capillary Refill: Capillary refill takes less than 2 seconds.  Neurological:  Mental Status: She is alert and oriented to person, place, and time.     Motor: No abnormal muscle tone.     Comments: Significant unidirectional, leftward beating nystagmus.  Disconjugate gaze noted.  Cranial nerves otherwise intact.  Strength out of 5 bilateral upper and lower extremities.  Normal sensation light touch.  Gait deferred.      ____________________________________________   LABS (all labs ordered are listed, but only abnormal results are displayed)  Labs Reviewed  BASIC METABOLIC PANEL - Abnormal; Notable for the following components:      Result Value   CO2 33 (*)    Glucose, Bld 121 (*)    All other components within normal limits  CBC - Abnormal; Notable for the following components:   Hemoglobin 15.1 (*)    HCT 47.0 (*)    MCV 108.3 (*)    MCH 34.8 (*)    All other components within normal limits  URINALYSIS, COMPLETE (UACMP) WITH MICROSCOPIC - Abnormal; Notable for the following components:   Color, Urine AMBER (*)    APPearance CLOUDY (*)    Ketones, ur 5 (*)    Protein, ur 30 (*)    Nitrite POSITIVE (*)    Leukocytes,Ua TRACE (*)    RBC / HPF >50 (*)    Bacteria, UA MANY (*)    All other components within normal limits  HIV ANTIBODY (ROUTINE TESTING W REFLEX)  HEMOGLOBIN A1C  LIPID PANEL  CBG MONITORING, ED    ____________________________________________  EKG: Normal sinus rhythm, ventricular rate 61.  PR 170, QRS 90, QTc 432.  No acute ST elevations or depressions. ________________________________________  RADIOLOGY All imaging, including plain films, CT scans, and ultrasounds,  independently reviewed by me, and interpretations confirmed via formal radiology reads.  ED MD interpretation:   CT head: No acute intracranial abnormalities, old left medial orbital wall deformity  Official radiology report(s): CT ANGIO HEAD NECK W WO CM  Result Date: 06/24/2021 CLINICAL DATA:  Vertigo EXAM: CT ANGIOGRAPHY HEAD AND NECK TECHNIQUE: Multidetector CT imaging of the head and neck was performed using the standard protocol during bolus administration of intravenous contrast. Multiplanar CT image reconstructions and MIPs were obtained to evaluate the vascular anatomy. Carotid stenosis measurements (when applicable) are obtained utilizing NASCET criteria, using the distal internal carotid diameter as the denominator. CONTRAST:  96mL OMNIPAQUE IOHEXOL 350 MG/ML SOLN COMPARISON:  None. FINDINGS: The examination is severely limited by photon starvation artifacts related to patient body habitus. CTA NECK FINDINGS AORTIC ARCH: There is no calcific atherosclerosis of the aortic arch. There is no aneurysm, dissection or hemodynamically significant stenosis of the visualized portion of the aorta. Conventional 3 vessel aortic branching pattern. RIGHT CAROTID SYSTEM: Poor visualization of the common carotid artery. There is a retropharyngeal course of the internal carotid artery. Vessel appears grossly patent but cannot otherwise be assessed. LEFT CAROTID SYSTEM: Poor visualization of the common carotid artery. There is a retropharyngeal course of the internal carotid artery. Vessel appears grossly patent but cannot otherwise be assessed. VERTEBRAL ARTERIES: V1 through V3 segments cannot be visualized. CTA HEAD FINDINGS POSTERIOR CIRCULATION: --Vertebral arteries: Poor visualization --Inferior cerebellar arteries: Poor visualization --Basilar artery: Poor visualization but grossly patent --Superior cerebellar arteries: Normal. --Posterior cerebral arteries (PCA): Normal. ANTERIOR CIRCULATION: --Intracranial  internal carotid arteries: Atherosclerotic calcification of the internal carotid arteries at the skull base without hemodynamically significant stenosis. --Anterior cerebral arteries (ACA): Normal. Both A1 segments are present. Patent anterior communicating artery (a-comm). --Middle cerebral arteries (MCA): Normal.  VENOUS SINUSES: As permitted by contrast timing, patent. ANATOMIC VARIANTS: None Review of the MIP images confirms the above findings. IMPRESSION: 1. Severely limited examination due to patient body habitus. Within that limitation, no visible intracranial arterial occlusion or high-grade stenosis. Electronically Signed   By: Deatra Robinson M.D.   On: 06/24/2021 01:00   CT Head Wo Contrast  Result Date: 06/23/2021 CLINICAL DATA:  Dizziness EXAM: CT HEAD WITHOUT CONTRAST TECHNIQUE: Contiguous axial images were obtained from the base of the skull through the vertex without intravenous contrast. COMPARISON:  None. FINDINGS: Brain: No evidence of acute infarction, hemorrhage, hydrocephalus, extra-axial collection or mass lesion/mass effect. Vascular: Moderate intracranial arterial vascular calcifications. Skull: Calvarium appears intact. Sinuses/Orbits: Old appearing medial left orbital wall deformity. Opacification of the left frontal sinus and left ethmoid air cells. Mastoid air cells are clear. Other: None. IMPRESSION: 1. No acute intracranial abnormalities. 2. Left medial orbital wall deformity appears old. Opacification of the left ethmoid air cells and frontal sinus, likely inflammatory. If there is clinical history of recent injury to the left orbit, the possibility of acute fracture could be considered. Electronically Signed   By: Burman Nieves M.D.   On: 06/23/2021 22:06    ____________________________________________  PROCEDURES   Procedure(s) performed (including Critical Care):  Procedures  ____________________________________________  INITIAL IMPRESSION / MDM / ASSESSMENT AND  PLAN / ED COURSE  As part of my medical decision making, I reviewed the following data within the electronic MEDICAL RECORD NUMBER Nursing notes reviewed and incorporated, Old chart reviewed, Notes from prior ED visits, and [place] Controlled Substance Database       *Jisell Majer. Fairbank was evaluated in Emergency Department on 06/24/2021 for the symptoms described in the history of present illness. She was evaluated in the context of the global COVID-19 pandemic, which necessitated consideration that the patient might be at risk for infection with the SARS-CoV-2 virus that causes COVID-19. Institutional protocols and algorithms that pertain to the evaluation of patients at risk for COVID-19 are in a state of rapid change based on information released by regulatory bodies including the CDC and federal and state organizations. These policies and algorithms were followed during the patient's care in the ED.  Some ED evaluations and interventions may be delayed as a result of limited staffing during the pandemic.*     Medical Decision Making:  60 yo F here with new onset vertigo, weakness. By the time of my assessment, pt is >5 hr since LKN and outside of dizziness/nystagmus, has no other focal neurological deficits. CT head reviewed, shows NAICA. Pt does incidentally have UTI but do not suspect this is primary driver for her sx. Lytes wnl. CBC unremarkable. Given the degree of her nystagmus on exam, concern for central vertigo 2/2 CVA, ddx includes peripheral vertigo. Unfortunately, pt cannot obtain MRI 2/2 habitus/size restrictions. Will check CT Angio, admit for stroke eval/neuro consult.   ____________________________________________  FINAL CLINICAL IMPRESSION(S) / ED DIAGNOSES  Final diagnoses:  Stroke (cerebrum) (HCC)     MEDICATIONS GIVEN DURING THIS VISIT:  Medications  aspirin EC tablet 81 mg (has no administration in time range)  atenolol (TENORMIN) tablet 50 mg (has no administration in time range)   furosemide (LASIX) tablet 80 mg (has no administration in time range)  losartan (COZAAR) tablet 100 mg (has no administration in time range)  metolazone (ZAROXOLYN) tablet 2.5 mg (has no administration in time range)  bismuth subsalicylate (PEPTO BISMOL) 262 MG/15ML suspension 30 mL (has no administration in time  range)  calcium carbonate (TUMS - dosed in mg elemental calcium) chewable tablet 200 mg of elemental calcium (200 mg of elemental calcium Oral Given 06/24/21 0150)  Cyanocobalamin CAPS (has no administration in time range)  Cholecalciferol CAPS 10,000 Units (has no administration in time range)  albuterol (VENTOLIN HFA) 108 (90 Base) MCG/ACT inhaler 2 puff (has no administration in time range)   stroke: mapping our early stages of recovery book (has no administration in time range)  0.9 %  sodium chloride infusion ( Intravenous New Bag/Given 06/24/21 0126)  acetaminophen (TYLENOL) tablet 650 mg (650 mg Oral Given 06/24/21 0129)    Or  acetaminophen (TYLENOL) 160 MG/5ML solution 650 mg ( Per Tube See Alternative 06/24/21 0129)    Or  acetaminophen (TYLENOL) suppository 650 mg ( Rectal See Alternative 06/24/21 0129)  senna-docusate (Senokot-S) tablet 1 tablet (has no administration in time range)  enoxaparin (LOVENOX) injection 100 mg (100 mg Subcutaneous Given 06/24/21 0130)  clopidogrel (PLAVIX) tablet 75 mg (has no administration in time range)  ondansetron (ZOFRAN) injection 4 mg (4 mg Intravenous Given 06/24/21 0127)  traZODone (DESYREL) tablet 25 mg (has no administration in time range)  cefTRIAXone (ROCEPHIN) 1 g in sodium chloride 0.9 % 100 mL IVPB (1 g Intravenous New Bag/Given 06/24/21 0138)  meclizine (ANTIVERT) tablet 12.5 mg (12.5 mg Oral Given 06/23/21 2045)  iohexol (OMNIPAQUE) 350 MG/ML injection 75 mL (75 mLs Intravenous Contrast Given 06/24/21 0024)     ED Discharge Orders     None        Note:  This document was prepared using Dragon voice recognition software and may  include unintentional dictation errors.   Shaune Pollack, MD 06/24/21 [phone]

## 2021-06-23 NOTE — ED Notes (Signed)
Lab coming to stick patient

## 2021-06-23 NOTE — ED Notes (Addendum)
Warm blanket and ginger ale provided. Pt states nausea has subsided.

## 2021-06-23 NOTE — ED Notes (Signed)
Patient return from CT

## 2021-06-23 NOTE — H&P (Signed)
PATIENT NAME: [name]    MR#:  [id number]  DATE OF BIRTH:  [birth year]  DATE OF ADMISSION:  06/23/2021  PRIMARY CARE PHYSICIAN: Gracelyn Nurse, MD   Patient is coming from: Home  REQUESTING/REFERRING PHYSICIAN: Shaune Pollack, MD  CHIEF COMPLAINT:   Chief Complaint  Patient presents with   Dizziness    HISTORY OF PRESENT ILLNESS:  [name] is a 60 y.o. African-American female with medical history significant for morbid obesity, asthma, CHF, lymphedema, depression, GERD, hypertension and obstructive sleep apnea, who presented to the emergency room with acute onset of generalized weakness as well as dizziness with associated vertigo, nausea and vomiting twice since yesterday.  She had a brief episode initially and later on it recurred and became constant.  She denied any paresthesias or focal muscle weakness.  She admits to urinary frequency and urgency without dysuria, oliguria or hematuria or flank pain.  No cough or wheezing or hemoptysis.  No chest pain or palpitations.  No other bleeding diathesis.  ED Course: Upon presentation to the ER, blood pressure was 148/69 with otherwise normal vital signs.  Labs revealed unremarkable BMP.  CBC showed mild hemoconcentration.  UA showed many bacteria 6-10 WBCs with positive nitrite and trace leukocytes and more than 50 RBCs.    EKG as reviewed by me : Showed normal sinus rhythm with a rate of 61. Imaging: Noncontrasted head CT scan revealed the following: 1. No acute intracranial abnormalities. 2. Left medial orbital wall deformity appears old. Opacification of the left ethmoid air cells and frontal sinus, likely inflammatory.  Head CTA was ordered and is currently pending.  The patient was given a gram of IV Rocephin and 12.5 mg of p.o. meclizine.  She will be admitted to AN observation medically monitored bed for further evaluation and management. PAST MEDICAL HISTORY:   Past Medical History:   Diagnosis Date   Asthma 01/25/2018   CHF (congestive heart failure) (HCC)    Depression    GERD (gastroesophageal reflux disease)    Hypertension    Lymphedema    Morbid obesity (HCC)    Sleep apnea     PAST SURGICAL HISTORY:  History reviewed. No pertinent surgical history.  She denies any previous surgeries.  SOCIAL HISTORY:   Social History   Tobacco Use   Smoking status: Former    Years: 10.00    Pack years: 0.00    Types: Cigarettes    Quit date: 11/06/2013    Years since quitting: 7.6   Smokeless tobacco: Never   Tobacco comments:    quit 7 years ago  Substance Use Topics   Alcohol use: Yes    Alcohol/week: 1.0 standard drink    Types: 1 Standard drinks or equivalent per week    Comment: occasional alcohol use on weekend    FAMILY HISTORY:   Family History  Problem Relation Age of Onset   CVA Father    Hypertension Father    Seizures Father    Hypertension Mother    Kidney disease Mother    Cancer Paternal Grandmother     DRUG ALLERGIES:  No Known Allergies  REVIEW OF SYSTEMS:   ROS As per history of present illness. All pertinent systems were reviewed above. Constitutional, HEENT, cardiovascular, respiratory, GI, GU, musculoskeletal, neuro, psychiatric, endocrine, integumentary and hematologic systems were reviewed and are otherwise negative/unremarkable except for positive findings mentioned above in the HPI.   MEDICATIONS AT HOME:  Prior to Admission medications   Medication Sig Start Date End Date Taking? Authorizing Provider  albuterol (PROVENTIL HFA;VENTOLIN HFA) 108 (90 Base) MCG/ACT inhaler Inhale 2 puffs into the lungs every 6 (six) hours as needed for wheezing or shortness of breath.    [provider]  aspirin EC 81 MG tablet Take 1 tablet (81 mg total) by mouth daily. 07/22/17   End, Cristal Deer, MD  atenolol (TENORMIN) 50 MG tablet Take 1 tablet (50 mg total) by mouth 2 (two) times daily. 02/13/17   Delma Freeze, FNP   bismuth subsalicylate (PEPTO BISMOL) 262 MG/15ML suspension Take 30 mLs by mouth every 6 (six) hours as needed.    [provider]  calcium carbonate (TUMS - DOSED IN MG ELEMENTAL CALCIUM) 500 MG chewable tablet Chew 1 tablet by mouth daily as needed for indigestion or heartburn.    [provider]  Cholecalciferol 250 MCG (10000 UT) CAPS Take 10,000 Units by mouth once a week.    [provider]  Cyanocobalamin 1000 MCG CAPS Take by mouth daily.    [provider]  Dimethicone-Zinc Oxide 5-5 % CREA Apply 1 application topically as needed.    [provider]  furosemide (LASIX) 80 MG tablet Take 1 tablet (80 mg total) by mouth daily. 02/13/17   Delma Freeze, FNP  losartan (COZAAR) 100 MG tablet Take 100 mg by mouth daily. 10/14/20   [provider]  Menthol, Topical Analgesic, (BIOFREEZE) 4 % GEL Apply topically.    [provider]  metolazone (ZAROXOLYN) 2.5 MG tablet Take by mouth. 2.5 mg daily x2 x week 10/26/20   [provider]  Probiotic Product (PROBIOTIC DAILY PO) Take by mouth.    [provider]      VITAL SIGNS:  Blood pressure (!) 159/95, pulse 66, temperature 97.8 F (36.6 C), temperature source Oral, resp. rate (!) 21, height 5\' 2"  (1.575 m), weight (!) 199.6 kg, SpO2 97 %.  PHYSICAL EXAMINATION:  Physical Exam  GENERAL:  60 y.o.-year-old morbid obese African-American female patient lying in the bed with no acute distress.  EYES: Pupils equal, round, reactive to light and accommodation. No scleral icterus. Extraocular muscles intact.  HEENT: Head atraumatic, normocephalic. Oropharynx and nasopharynx clear.  NECK:  Supple, no jugular venous distention. No thyroid enlargement, no tenderness.  LUNGS: Normal breath sounds bilaterally, no wheezing, rales,rhonchi or crepitation. No use of accessory muscles of respiration.  CARDIOVASCULAR: Regular rate and rhythm, S1, S2 normal. No murmurs, rubs, or  gallops.  ABDOMEN: Soft, nondistended, nontender. Bowel sounds present. No organomegaly or mass.  EXTREMITIES: Bilateral lower extremity lymphedema with no cyanosis, or clubbing.  NEUROLOGIC: Cranial nerves II through XII are intact except for positive lateral nystagmus.. Muscle strength 5/5 in all extremities. Sensation intact. Gait not checked.  PSYCHIATRIC: The patient is alert and oriented x 3.  Normal affect and good eye contact. SKIN: No obvious rash, lesion, or ulcer.   LABORATORY PANEL:   CBC Recent Labs  Lab 06/23/21 1811  WBC 7.2  HGB 15.1*  HCT 47.0*  PLT 184   ------------------------------------------------------------------------------------------------------------------  Chemistries  Recent Labs  Lab 06/23/21 1811  NA 138  K 4.7  CL 99  CO2 33*  GLUCOSE 121*  BUN 16  CREATININE 0.69  CALCIUM 9.0   ------------------------------------------------------------------------------------------------------------------  Cardiac Enzymes No results for input(s): TROPONINI in the last 168 hours. ------------------------------------------------------------------------------------------------------------------  RADIOLOGY:  CT Head Wo Contrast  Result Date: 06/23/2021 CLINICAL DATA:  Dizziness EXAM: CT HEAD  WITHOUT CONTRAST TECHNIQUE: Contiguous axial images were obtained from the base of the skull through the vertex without intravenous contrast. COMPARISON:  None. FINDINGS: Brain: No evidence of acute infarction, hemorrhage, hydrocephalus, extra-axial collection or mass lesion/mass effect. Vascular: Moderate intracranial arterial vascular calcifications. Skull: Calvarium appears intact. Sinuses/Orbits: Old appearing medial left orbital wall deformity. Opacification of the left frontal sinus and left ethmoid air cells. Mastoid air cells are clear. Other: None. IMPRESSION: 1. No acute intracranial abnormalities. 2. Left medial orbital wall deformity appears old. Opacification  of the left ethmoid air cells and frontal sinus, likely inflammatory. If there is clinical history of recent injury to the left orbit, the possibility of acute fracture could be considered. Electronically Signed   By: Burman Nieves M.D.   On: 06/23/2021 22:06      IMPRESSION AND PLAN:  Active Problems:   CVA (cerebral vascular accident) (HCC)  1.  Persistent vertigo with associated lateral nystagmus, rule out acute CVA. - The patient will be admitted to an observation medical monitored bed. - We will follow neurochecks every 4 hours for 24 hours. - We will obtain a head CTA as MRI could not be obtained given her weight. - We will add Plavix to her aspirin. - PT/OT and ST consults will be obtained. - Neurology consult will be obtained. - I notified Dr. Otelia Limes about the patient.  2.  UTI. - The patient will be placed on IV Rocephin and will follow urine culture and sensitivity.  3.  Essential hypertension. - We will continue Cozaar and atenolol with permissive parameters.  4.  Compensated CHF with preserved EF. - We will continue her Lasix and Zaroxolyn in addition to Cozaar.  5.  Vitamin B12 deficiency. - We will continue vitamin B12  DVT prophylaxis: Lovenox. Code Status: full code. Family Communication:  The plan of care was discussed in details with the patient (and family). I answered all questions. The patient agreed to proceed with the above mentioned plan. Further management will depend upon hospital course. Disposition Plan: Back to previous home environment Consults called: Neurology. All the records are reviewed and case discussed with ED provider.  Status is: Observation  The patient remains OBS appropriate and will d/c before 2 midnights.  Dispo: The patient is from: Home              Anticipated d/c is to: Home              Patient currently is not medically stable to d/c.   Difficult to place patient No   TOTAL TIME TAKING CARE OF THIS PATIENT: 55  minutes.    Hannah Beat M.D on 06/23/2021 at 11:53 PM  [facility]   From 7 PM-7 AM, contact night-coverage [email or web address]  CC: Primary care physician; Gracelyn Nurse, MD

## 2021-06-23 NOTE — ED Triage Notes (Signed)
Pt in via EMS from home with c/o dizziness. EMS reports pt A&O, pt also with pitting edema to BLE but reports that is normal and she takes lasix for it. Pt with hx of CHF. 96-100% on 4L. Pt usually on 2L of oxygen but EMS raised to 4L, FSBS 133, HR 62, BP 184/85. Pt was given 4mg  of zofram IM at 14:24

## 2021-06-23 NOTE — ED Triage Notes (Signed)
Pt via EMS from home. Pt c/o dizziness and nausea 1 hour PTA. Pt states she felt like the room is spinning. Denies CP/SOB. Pt is A&OX4 and NAD

## 2021-06-23 NOTE — Progress Notes (Signed)
PHARMACIST - PHYSICIAN COMMUNICATION  CONCERNING:  Enoxaparin (Lovenox) for DVT Prophylaxis    RECOMMENDATION: Patient was prescribed enoxaprin 40mg  q24 hours for VTE prophylaxis.   Filed Weights   06/23/21 1502  Weight: (!) 199.6 kg (440 lb)    Body mass index is 80.48 kg/m.  Estimated Creatinine Clearance: 128.1 mL/min (by C-G formula based on SCr of 0.69 mg/dL).   Based on [facility] policy patient is candidate for enoxaparin 0.5mg /kg TBW SQ every 24 hours based on BMI being >30.  DESCRIPTION: Pharmacy has adjusted enoxaparin dose per [facility] policy.  Patient is now receiving enoxaparin 0.5 mg/kg every 24 hours   [facility], PharmD, [facility] 06/23/2021 11:53 PM

## 2021-06-24 ENCOUNTER — Observation Stay: Payer: Medicaid Other

## 2021-06-24 ENCOUNTER — Observation Stay (HOSPITAL_BASED_OUTPATIENT_CLINIC_OR_DEPARTMENT_OTHER)
Admit: 2021-06-24 | Discharge: 2021-06-24 | Disposition: A | Discharge status: Home / Self Care | Payer: Medicaid Other | Attending: Family Medicine | Admitting: Family Medicine

## 2021-06-24 DIAGNOSIS — R42 Dizziness and giddiness: Secondary | ICD-10-CM | POA: Diagnosis not present

## 2021-06-24 DIAGNOSIS — I10 Essential (primary) hypertension: Secondary | ICD-10-CM | POA: Diagnosis not present

## 2021-06-24 DIAGNOSIS — I6389 Other cerebral infarction: Secondary | ICD-10-CM

## 2021-06-24 DIAGNOSIS — I6523 Occlusion and stenosis of bilateral carotid arteries: Secondary | ICD-10-CM | POA: Diagnosis not present

## 2021-06-24 DIAGNOSIS — I672 Cerebral atherosclerosis: Secondary | ICD-10-CM | POA: Diagnosis not present

## 2021-06-24 DIAGNOSIS — R55 Syncope and collapse: Secondary | ICD-10-CM | POA: Diagnosis not present

## 2021-06-24 DIAGNOSIS — Z8673 Personal history of transient ischemic attack (TIA), and cerebral infarction without residual deficits: Secondary | ICD-10-CM | POA: Diagnosis not present

## 2021-06-24 LAB — ECHOCARDIOGRAM COMPLETE BUBBLE STUDY
AR max vel: 1.19 cm2
AV Area VTI: 1.61 cm2
AV Area mean vel: 1.51 cm2
AV Mean grad: 4 mmHg
AV Peak grad: 10.4 mmHg
Ao pk vel: 1.61 m/s
Area-P 1/2: 3.28 cm2
S' Lateral: 2.87 cm

## 2021-06-24 LAB — LIPID PANEL
Cholesterol: 157 mg/dL (ref 0–200)
HDL: 77 mg/dL (ref >40)
LDL Cholesterol: 72 mg/dL (ref 0–99)
Total CHOL/HDL Ratio: 2 RATIO
Triglycerides: 39 mg/dL (ref <150)
VLDL: 8 mg/dL (ref 0–40)

## 2021-06-24 LAB — HIV ANTIBODY (ROUTINE TESTING W REFLEX): HIV Screen 4th Generation wRfx: NONREACTIVE

## 2021-06-24 MED ORDER — CLOPIDOGREL BISULFATE 75 MG PO TABS: 75.0000 mg | ORAL_TABLET | Freq: Every day | ORAL | 1 refills | Status: AC

## 2021-06-24 MED ORDER — PERFLUTREN LIPID MICROSPHERE
1.0000 mL | INTRAVENOUS | Status: AC | PRN
Start: 2021-06-24 — End: 2021-06-24
  Administered 2021-06-24: 5 mL via INTRAVENOUS
  Filled 2021-06-24: qty 10

## 2021-06-24 MED ORDER — ASPIRIN EC 81 MG PO TBEC: 81.0000 mg | DELAYED_RELEASE_TABLET | Freq: Every day | ORAL | Status: AC

## 2021-06-24 MED ORDER — IOHEXOL 350 MG/ML SOLN
75.0000 mL | Freq: Once | INTRAVENOUS | Status: AC | PRN
  Administered 2021-06-24: 75 mL via INTRAVENOUS

## 2021-06-24 NOTE — H&P (Signed)
NEUROLOGY CONSULTATION NOTE   Date of service: June 24, 2021 Patient Name: [name] MRN:  [id number] DOB:  16-Oct-1961 Reason for consult: vertigo and nystagmus Requesting physician: Dr. Lolita Patella _ _ _   _ __   _ __ _ _  __ __   _ __   __ _  History of Present Illness   This is a 60 year old woman with past medical history significant for morbid obesity, asthma, CHF, lymphedema, depression, hypertension, OSA who presented to the emergency department 2 days ago due to generalized weakness, vertigo/room spinning, nausea and vomiting.  Her symptoms started on Saturday and have been intermittent.  Yesterday they were constant.  Exacerbated by head turn to the left.  Denies focal weakness or numbness, no difficulty speaking.  She has never had the symptoms before.  Head CT showed no acute intracranial process.  MRI brain was unable to be performed due to body habitus.  CTA angio head and neck as well as carotid Dopplers were limited due to body habitus but did not show any obvious hemodynamically significant stenoses. TTE showed grade II diastolic dysfunction without other significant abnl (no intracardiac clot nad negative bubble). Her vertigo has resolved now after administration of meclizine.    ROS   Per HPI; all other systems reviewed and are negative.  Past History   Past Medical History:  Diagnosis Date   Asthma 01/25/2018   CHF (congestive heart failure) (HCC)    Depression    GERD (gastroesophageal reflux disease)    Hypertension    Lymphedema    Morbid obesity (HCC)    Sleep apnea    History reviewed. No pertinent surgical history. Family History  Problem Relation Age of Onset   CVA Father    Hypertension Father    Seizures Father    Hypertension Mother    Kidney disease Mother    Cancer Paternal Grandmother    Social History   Socioeconomic History   Marital status: Divorced    Spouse name: Not on file   Number of children: 1   Years of education: 12    Highest education level: 12th grade  Occupational History   Occupation: Disability  Tobacco Use   Smoking status: Former    Years: 10.00    Pack years: 0.00    Types: Cigarettes    Quit date: 11/06/2013    Years since quitting: 7.6   Smokeless tobacco: Never   Tobacco comments:    quit 7 years ago  Vaping Use   Vaping Use: Never used  Substance and Sexual Activity   Alcohol use: Yes    Alcohol/week: 1.0 standard drink    Types: 1 Standard drinks or equivalent per week    Comment: occasional alcohol use on weekend   Drug use: No   Sexual activity: Not Currently    Birth control/protection: Condom  Other Topics Concern   Not on file  Social History Narrative   Lives by herself. Daughter checks on her. Ambulates with walker   Social Determinants of Health   Financial Resource Strain: Not on file  Food Insecurity: Not on file  Transportation Needs: Not on file  Physical Activity: Not on file  Stress: Not on file  Social Connections: Not on file   No Known Allergies  Medications   Current Outpatient Medications  Medication Instructions   aspirin EC 81 mg, Oral, Daily   atenolol (TENORMIN) 50 mg, Oral, 2 times daily   bismuth subsalicylate (PEPTO BISMOL) 262  MG/15ML suspension 30 mLs, Oral, Every 6 hours PRN   calcium carbonate (TUMS - DOSED IN MG ELEMENTAL CALCIUM) 500 MG chewable tablet 1 tablet, Oral, Daily PRN   Cholecalciferol 10,000 Units, Oral, Weekly   [START ON 06/25/2021] clopidogrel (PLAVIX) 75 mg, Oral, Daily   Cyanocobalamin 1000 MCG CAPS Oral, Daily   diclofenac Sodium (VOLTAREN) 2 g, Topical, 4 times daily   Dimethicone-Zinc Oxide 5-5 % CREA 1 application, Apply externally, As needed   furosemide (LASIX) 80 mg, Oral, Daily   losartan (COZAAR) 100 mg, Oral, Daily   Menthol, Topical Analgesic, (BIOFREEZE) 4 % GEL Apply externally   metolazone (ZAROXOLYN) 2.5 MG tablet Oral, 2.5 mg daily x2 x week    OXYGEN 2 L, Inhalation, Continuous       Vitals    Vitals:   06/24/21 0730 06/24/21 0800 06/24/21 0830 06/24/21 0845  BP: (!) 160/73 (!) 167/85 138/64   Pulse: 64 61 67 70  Resp:      Temp:      TempSrc:      SpO2: 100% 98%  93%  Weight:      Height:         Body mass index is 80.48 kg/m.  Physical Exam   Physical Exam Gen: A&O x4, NAD HEENT: Atraumatic, normocephalic;mucous membranes moist; oropharynx clear, tongue without atrophy or fasciculations. Neck: Supple, trachea midline. Resp: CTAB, no w/r/r CV: RRR, no m/g/r; nml S1 and S2. 2+ symmetric peripheral pulses. Abd: soft/NT/ND; nabs x 4 quad Extrem: lymphedema and venous stasis bilat LE  Neuro: *MS: A&O x4. Follows multi-step commands.  *Speech: fluid, nondysarthric, able to name and repeat *CN:    I: Deferred   II,III: PERRLA, VFF by confrontation   III,IV,VI: EOMI, L exotropia, L beating nystagmus on Lward gaze, no ptosis   V: Sensation intact from V1 to V3 to LT   VII: Eyelid closure was full.  Smile symmetric.   VIII: Hearing intact to voice   IX,X: Voice normal, palate elevates symmetrically    XI: SCM/trap 5/5 bilat   XII: Tongue protrudes midline, no atrophy or fasciculations  *Motor:   Normal bulk.  No tremor, rigidity or bradykinesia. BUE 5/5 throughout without drift. BLE barely anti-gravity, patient states is near baseline.  *Sensory: Intact to light touch, pinprick, temperature vibration throughout. Symmetric. Propioception intact bilat.  No double-simultaneous extinction.  *Coordination:  FNF intact bilat *Reflexes:  1+ and symmetric throughout without clonus; toes down-going bilat *Gait: deferred  NIHSS = 4 (2 motor LLE, 2 motor RLE)   Premorbid mRS = 3   Labs   CBC:  Recent Labs  Lab 06/23/21 1811  WBC 7.2  HGB 15.1*  HCT 47.0*  MCV 108.3*  PLT 184    Basic Metabolic Panel:  Lab Results  Component Value Date   NA 138 06/23/2021   K 4.7 06/23/2021   CO2 33 (H) 06/23/2021   GLUCOSE 121 (H) 06/23/2021   BUN 16 06/23/2021    CREATININE 0.69 06/23/2021   CALCIUM 9.0 06/23/2021   GFRNONAA >60 06/23/2021   GFRAA >60 05/08/2020   Lipid Panel:  Lab Results  Component Value Date   LDLCALC 72 06/24/2021   HgbA1c:  Lab Results  Component Value Date   HGBA1C 5.1 02/12/2013   Urine Drug Screen: No results found for: LABOPIA, COCAINSCRNUR, LABBENZ, AMPHETMU, THCU, LABBARB  Alcohol Level No results found for: ETH   Impression   This is a 60 year old woman with past medical history significant for morbid  obesity, asthma, CHF, lymphedema, depression, hypertension, OSA who presented to the emergency department 2 days ago due to generalized weakness, vertigo/room spinning, nausea and vomiting.  Head CT showed no acute intracranial process.  MRI brain was unable to be performed due to body habitus.  CTA angio head and neck as well as carotid Dopplers were limited due to body habitus but did not show any obvious hemodynamically significant stenoses. TTE showed grade II diastolic dysfunction without other significant abnl (no intracardiac clot nad negative bubble). Her vertigo has resolved now after administration of meclizine. Given that patient cannot undergo MRI, cannot rule out central process. She has does have L beating nystagmus but her vertigo sx resolved after meclizine. Exacerbation upon head turn to L points to a peripheral process, although the fact that her sx persisted throughout the day yesterday makes BPPV less likely. Ddx includes alt peripheral etiology such as labrynthitis or vestibular neuronitis vs small cerebellar stroke. No indication for repeat head CT given that her sx started 48 hrs ago. Given that central etiology cannot be ruled out, will continue her on DAPT x21 days f/b plavix 75mg  daily after that (will consider this an aspirin failure).   Recommendations   - Stroke w/u completed. OK to discharge patient from ED from neuro standpoint on ASA 81mg  daily + plavix 75mg  daily x21 days f/b plavix 75mg   daily after that - I will arrange o/p neurology f/u  ______________________________________________________________________   Thank you for the opportunity to take part in the care of this patient. If you have any further questions, please contact the neurology consultation attending.  Signed,  , MD [facility] [phone]  If 7pm- 7am, please page neurology on call as listed in AMION.

## 2021-06-24 NOTE — Progress Notes (Addendum)
SLP Cancellation Note  Patient Details Name: [name] MRN: [id number] DOB: 1961-06-25   Cancelled treatment:       Reason Eval/Treat Not Completed: SLP screened, no needs identified, will sign off (chart reviewed; consulted pt, NSG staff). Pt is currently being placed on a Regular diet by MD after passing the [employer] swallow screen last shift; tolerates swallowing thin liquids and pills w/ NSG w/out issues. Pt conversed at conversational level w/out deficits noted; pt and NSG denied any speech-language deficits.  No further skilled ST services indicated as pt appears at her baseline. Pt agreed. NSG to reconsult if any new issues or change in status noted while admitted.       Jerilynn Som, MS, CCC-SLP Speech Language Pathologist [facility] [phone] [facility] 06/24/2021, 8:57 AM

## 2021-06-24 NOTE — ED Notes (Addendum)
RT at the bedside to place Cpap. Pharmacy at the bedside to reconcile medications.

## 2021-06-24 NOTE — Evaluation (Signed)
Occupational Therapy Evaluation Patient Details Name: [name] MRN: [id number] DOB: 12-Aug-1961 Today's Date: 06/24/2021    History of Present Illness 60 y.o. African-American female with medical history significant for morbid obesity, asthma, CHF, lymphedema, depression, GERD, hypertension and obstructive sleep apnea, who presented to the emergency room with acute onset of generalized weakness as well as dizziness with associated vertigo, nausea and vomiting twice since yesterday. Imaging reveals no acute intracranial abnormalities however is noted that body habitus limited results.   Clinical Impression   Patient is a pleasant 60 year old female who presents with dizziness and limited mobility. Prior to hospital admission, pt required assistance with mobility and transfers and lives alone. She lives in an apartment and has an aide 7 days/week with aid present 2-3 hours a day during M-F and one hour a day on S and Su. Patient relies on aide for cooking, cleaning, bathing, dressing, toileting clean up, and transfers.    Patient in bed upon PT and OT co-treat arrival and agreeable to participate in therapy. She is on 2L of oxygen via nasal cannula. 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 Head turns to L and R increased dizziness to the L but not the right however no beating of eye noted, vertical up/down head nods did not increase symptoms. Pt requires +1 assist for sup>sit EOB from PT, OT supervision for safety. Pt does endorse mild dizziness upon sitting. MAX A x2 to return to stretcher (high, difficulty with bringing BLE up onto the bed). +2 from OT for safety with standing and lateral steps. Wet linens removed and replaced. New purewick replaced and pt's skin under folds and in groin area noted to be at risk for skin breakdown. RN notified.  Pt was then able to  push herself up to top of bed without assistance with bilateral rails. Pt would benefit from skilled OT to address noted impairments and functional limitations (see below for any additional details).  Upon hospital discharge, pt would benefit from a manual wheelchair, HHOT, and increased hours from an aide daily due to need for assistance for ADLs. Also recommend trial of toileting aide to promote independence with pericare.    Follow Up Recommendations  Home health OT    Equipment Recommendations  Other (comment);Wheelchair (measurements OT);Wheelchair cushion (measurements OT) (bariatric)    Recommendations for Other Services       Precautions / Restrictions Precautions Precautions: Fall Restrictions Weight Bearing Restrictions: No Other Position/Activity Restrictions: mobility limited by body habitus and pain in LE's      Mobility Bed Mobility Overal bed mobility: Needs Assistance Bed Mobility: Rolling;Supine to Sit;Sit to Supine Rolling: Min assist;Mod assist   Supine to sit: Mod assist;+2 for physical assistance;HOB elevated;+2 for safety/equipment Sit to supine: Max assist;+2 for physical assistance;HOB elevated;+2 for safety/equipment   General bed mobility comments: Patient requires assistance for LLE to sit EOB, requires x2 assistance and unable to lift legs back into bed. Is able to push herself up to top of bed without assistance in supine position.    Transfers Overall transfer level: Needs assistance Equipment used: Rolling walker (2 wheeled) (bariatric) Transfers: Sit to/from Stand;Lateral/Scoot Transfers Sit to Stand: Min assist;+2 safety/equipment;From elevated surface        Lateral/Scoot Transfers: Min guard;From elevated surface General transfer comment: Requires stabilization of LE's for sit to stand transfer from elevated  surface. Dizzy initially upon standing.    Balance Overall balance assessment: Needs assistance Sitting-balance support: Bilateral  upper extremity supported Sitting balance-Leahy Scale: Poor Sitting balance - Comments: Patient sliding off bed upon transition to EOB. once positioned able to stabilize self with UE's.   Standing balance support: Bilateral upper extremity supported Standing balance-Leahy Scale: Fair Standing balance comment: Patient requries heavy BUE support on RW in standing due to pain in LE's.                           ADL either performed or assessed with clinical judgement   ADL Overall ADL's : Needs assistance/impaired                                       General ADL Comments: Pt currently requires MOD A for LB ADL tasks, +2 assist for ADL transfers + bari RW, MIN A for UB ADL in sitting, MAX A for toileting hygiene 2/2 body habitus     Vision Baseline Vision/History: Wears glasses (hx L eye lateral deviation at baseline) Wears Glasses: At all times Patient Visual Report: Nausea/blurring vision with head movement Vision Assessment?: Yes Eye Alignment: Impaired (comment) (hx L eye lateral deviation) Tracking/Visual Pursuits: Impaired - to be further tested in functional context;Other (comment) (decreased smoothness of L eye tracking) Convergence: Impaired (comment) (L eye does not converge) Additional Comments: eye tracking with L difficulty adducting (patient reports is baseline), able to turn head left and right (dizziness upon turning left), vertically without dizziness     Perception     Praxis      Pertinent Vitals/Pain Pain Assessment: 0-10 Pain Score: 8  Pain Location: BLEs Pain Descriptors / Indicators: Aching Pain Intervention(s): Limited activity within patient's tolerance;Monitored during session;Repositioned     Hand Dominance Right   Extremity/Trunk Assessment Upper Extremity Assessment Upper Extremity Assessment: Generalized weakness   Lower Extremity Assessment Lower Extremity Assessment: Generalized weakness       Communication  Communication Communication: No difficulties   Cognition Arousal/Alertness: Awake/alert Behavior During Therapy: WFL for tasks assessed/performed Overall Cognitive Status: Within Functional Limits for tasks assessed                                 General Comments: Patient A and O x 4, aware of limitations   General Comments  skin breakdown of L shin, noticeable odor and moisture from body habitus folds indicating poor care at home. RN notified.    Exercises Other Exercises Other Exercises: Patient educated on role of PT in acute care setting, transfers, and safe bed mobility. Vestibular: eye tracking with L difficulty adducting (patient reports is baseline), able to turn head left and right (dizziness upon turning left), vertically without dizziness. Other Exercises: Bed mobility, ADL transfer + bari RW, edu AE for toileting (will benefit from additional edu/training)   Shoulder Instructions      Home Living Family/patient expects to be discharged to:: Private residence Living Arrangements: Alone Available Help at Discharge: Available PRN/intermittently Type of Home: Apartment Home Access: Level entry     Home Layout: One level     Bathroom Shower/Tub: Tub/shower unit         Home Equipment: Environmental consultant - 2 wheels;Bedside commode;Grab bars - tub/shower;Shower seat   Additional Comments: Patient has an aide 7  days a week, one hour on weekends, 2-3 hours during the week.      Prior Functioning/Environment Level of Independence: Needs assistance  Gait / Transfers Assistance Needed: Has a RW but needs assistance occasionally, limited in ambulation, can stand pivot transfer to [facility] but not able to self clean fully. ADL's / Homemaking Assistance Needed: Aide assists in toileting clean up, dressing, bathing, cooking, etc. Patient does not drive. Family will provide transportation for appointments   Comments: Patient desires wheelchair due to limited mobility and  reliance upon aide.        OT Problem List: Decreased strength;Pain;Cardiopulmonary status limiting activity;Decreased activity tolerance;Impaired balance (sitting and/or standing);Obesity;Decreased knowledge of use of DME or AE      OT Treatment/Interventions: Self-care/ADL training;Therapeutic exercise;Therapeutic activities;DME and/or AE instruction;Energy conservation;Patient/family education;Balance training    OT Goals(Current goals can be found in the care plan section) Acute Rehab OT Goals Patient Stated Goal: to get a wheelchair so I can move around at home OT Goal Formulation: With patient Time For Goal Achievement: 07/08/21 Potential to Achieve Goals: Good ADL Goals Pt Will Perform Lower Body Dressing: with modified independence;with adaptive equipment;sit to/from stand Pt Will Transfer to Toilet: with supervision;ambulating (bari BSC, LRAD for amb, toileting aide for hygiene) Pt Will Perform Toileting - Clothing Manipulation and hygiene: with modified independence;with adaptive equipment;sit to/from stand  OT Frequency: Min 2X/week   Barriers to D/C:            Co-evaluation PT/OT/SLP Co-Evaluation/Treatment: Yes Reason for Co-Treatment: Complexity of the patient's impairments (multi-system involvement);For patient/therapist safety;To address functional/ADL transfers PT goals addressed during session: Mobility/safety with mobility;Proper use of DME;Balance;Strengthening/ROM OT goals addressed during session: ADL's and self-care;Proper use of Adaptive equipment and DME;Strengthening/ROM      AM-PAC OT "6 Clicks" Daily Activity     Outcome Measure Help from another person eating meals?: None Help from another person taking care of personal grooming?: A Little Help from another person toileting, which includes using toliet, bedpan, or urinal?: A Lot Help from another person bathing (including washing, rinsing, drying)?: A Lot Help from another person to put on and  taking off regular upper body clothing?: A Little Help from another person to put on and taking off regular lower body clothing?: A Lot 6 Click Score: 16   End of Session Equipment Utilized During Treatment: Gait belt;Rolling walker;Oxygen Nurse Communication: Mobility status;Other (comment) (concern for skin breakdown in folds/groin area)  Activity Tolerance: Patient tolerated treatment well Patient left: in bed;with call bell/phone within reach  OT Visit Diagnosis: Other abnormalities of gait and mobility (R26.89);Muscle weakness (generalized) (M62.81);Pain;Dizziness and giddiness (R42) Pain - Right/Left:  (both) Pain - part of body: Leg                Time: 0933-1006 OT Time Calculation (min): 33 min Charges:  OT General Charges $OT Visit: 1 Visit OT Evaluation $OT Eval Moderate Complexity: 1 Mod OT Treatments $Therapeutic Activity: 8-22 mins  Wynona Canes, MPH, MS, OTR/L ascom [phone] 06/24/21, 1:43 PM

## 2021-06-24 NOTE — ED Notes (Signed)
Patient return from CT, [name] at the bedside

## 2021-06-24 NOTE — Discharge Summary (Signed)
Physician Discharge Summary  [name]. Lovett OKH:[id number] DOB: 04-Jul-1961 DOA: 06/23/2021  PCP: Gracelyn Nurse, MD  Admit date: 06/23/2021 Discharge date: 06/24/2021  Admitted From: Home Disposition: Home with home health  Recommendations for Outpatient Follow-up:  Follow up with PCP in 1-2 weeks Consider referral to neurology  Home Health: Yes Equipment/Devices: Oxygen 2 L  Discharge Condition: Stable CODE STATUS: Full Diet recommendation: Heart Healthy  Brief/Interim Summary: 60 y.o. African-American female with medical history significant for morbid obesity, asthma, CHF, lymphedema, depression, GERD, hypertension and obstructive sleep apnea, who presented to the emergency room with acute onset of generalized weakness as well as dizziness with associated vertigo, nausea and vomiting twice since yesterday.  She had a brief episode initially and later on it recurred and became constant.  She denied any paresthesias or focal muscle weakness.  She admits to urinary frequency and urgency without dysuria, oliguria or hematuria or flank pain.  No cough or wheezing or hemoptysis.  No chest pain or palpitations.  No other bleeding diathesis  Patient symptoms of nausea vomiting and dizziness resolved.  I discussed the case with inpatient neurologist.  Presentation is not entirely clear.  Patient continues to demonstrate nystagmus on exam but presentation is inconsistent with BPPV.  Will elect to treat like a CVA.  At time of discharge recommend commendation aspirin and Plavix for the next 21 days.  At that time stop aspirin and continue Plavix monotherapy.  Discussed discharge instructions in detail with patient.  All questions answered.  Home health services and DME wheelchair ordered on discharge.  Patient discharged in stable condition.   Discharge Diagnoses:  Active Problems:   CVA (cerebral vascular accident) (HCC)  Persistent vertigo associated with lateral nystagmus While imaging did not  demonstrate signs of CVA physical exam was not consistent with BPPV.  After discussion with neurology will elect to treat with dual antiplatelet therapy aspirin and Plavix for the next 3 weeks, followed by Plavix monotherapy.  TTE ordered and results reviewed.  Negative for intracardiac thrombus.  Therapy consult ordered and home health will be arranged on discharge.  Possible UTI Patient was placed on IV Rocephin on admission however she is not endorsing any signs of infection.  Suspect asymptomatic bacteriuria.  No antibiotics on discharge.  Essential hypertension Continue home Cozaar and atenolol  Chronic diastolic congestive heart failure Repeat echocardiogram with grade 2 diastolic dysfunction.  No signs of acute exacerbation.  At time of discharge can continue Lasix and Zaroxolyn in addition to ACE inhibitor.  Follow-up outpatient cardiology.  Discharge Instructions  Discharge Instructions     Diet - low sodium heart healthy   Complete by: As directed    Increase activity slowly   Complete by: As directed       Allergies as of 06/24/2021   No Known Allergies      Medication List     STOP taking these medications    albuterol 108 (90 Base) MCG/ACT inhaler Commonly known as: VENTOLIN HFA   PROBIOTIC DAILY PO       TAKE these medications    aspirin EC 81 MG tablet Take 1 tablet (81 mg total) by mouth daily for 21 days.   atenolol 50 MG tablet Commonly known as: TENORMIN Take 1 tablet (50 mg total) by mouth 2 (two) times daily.   Biofreeze 4 % Gel Generic drug: Menthol (Topical Analgesic) Apply topically.   bismuth subsalicylate 262 MG/15ML suspension Commonly known as: PEPTO BISMOL Take 30 mLs by mouth every  6 (six) hours as needed.   calcium carbonate 500 MG chewable tablet Commonly known as: TUMS - dosed in mg elemental calcium Chew 1 tablet by mouth daily as needed for indigestion or heartburn.   Cholecalciferol 250 MCG (10000 UT) Caps Take 10,000  Units by mouth once a week.   clopidogrel 75 MG tablet Commonly known as: PLAVIX Take 1 tablet (75 mg total) by mouth daily. Start taking on: June 25, 2021   Cyanocobalamin 1000 MCG Caps Take by mouth daily.   diclofenac Sodium 1 % Gel Commonly known as: VOLTAREN Apply 2 g topically 4 (four) times daily.   Dimethicone-Zinc Oxide 5-5 % Crea Apply 1 application topically as needed.   furosemide 80 MG tablet Commonly known as: LASIX Take 1 tablet (80 mg total) by mouth daily.   losartan 100 MG tablet Commonly known as: COZAAR Take 100 mg by mouth daily.   metolazone 2.5 MG tablet Commonly known as: ZAROXOLYN Take by mouth. 2.5 mg daily x2 x week   OXYGEN Inhale 2 L into the lungs continuous.               Durable Medical Equipment  (From admission, onward)           Start     Ordered   06/24/21 1233  For home use only DME standard manual wheelchair with seat cushion  Once       Comments: Patient suffers from weakness which impairs their ability to perform daily activities like bathing, dressing, feeding, grooming, and toileting in the home.  A cane, crutch, or walker will not resolve issue with performing activities of daily living. A wheelchair will allow patient to safely perform daily activities. Patient can safely propel the wheelchair in the home or has a caregiver who can provide assistance. Length of need Lifetime. Accessories: elevating leg rests (ELRs), wheel locks, extensions and anti-tippers.   06/24/21 1233            No Known Allergies  Consultations: Neurology   Procedures/Studies: CT ANGIO HEAD NECK W WO CM  Result Date: 06/24/2021 CLINICAL DATA:  Vertigo EXAM: CT ANGIOGRAPHY HEAD AND NECK TECHNIQUE: Multidetector CT imaging of the head and neck was performed using the standard protocol during bolus administration of intravenous contrast. Multiplanar CT image reconstructions and MIPs were obtained to evaluate the vascular anatomy. Carotid  stenosis measurements (when applicable) are obtained utilizing NASCET criteria, using the distal internal carotid diameter as the denominator. CONTRAST:  86mL OMNIPAQUE IOHEXOL 350 MG/ML SOLN COMPARISON:  None. FINDINGS: The examination is severely limited by photon starvation artifacts related to patient body habitus. CTA NECK FINDINGS AORTIC ARCH: There is no calcific atherosclerosis of the aortic arch. There is no aneurysm, dissection or hemodynamically significant stenosis of the visualized portion of the aorta. Conventional 3 vessel aortic branching pattern. RIGHT CAROTID SYSTEM: Poor visualization of the common carotid artery. There is a retropharyngeal course of the internal carotid artery. Vessel appears grossly patent but cannot otherwise be assessed. LEFT CAROTID SYSTEM: Poor visualization of the common carotid artery. There is a retropharyngeal course of the internal carotid artery. Vessel appears grossly patent but cannot otherwise be assessed. VERTEBRAL ARTERIES: V1 through V3 segments cannot be visualized. CTA HEAD FINDINGS POSTERIOR CIRCULATION: --Vertebral arteries: Poor visualization --Inferior cerebellar arteries: Poor visualization --Basilar artery: Poor visualization but grossly patent --Superior cerebellar arteries: Normal. --Posterior cerebral arteries (PCA): Normal. ANTERIOR CIRCULATION: --Intracranial internal carotid arteries: Atherosclerotic calcification of the internal carotid arteries at the skull base without  hemodynamically significant stenosis. --Anterior cerebral arteries (ACA): Normal. Both A1 segments are present. Patent anterior communicating artery (a-comm). --Middle cerebral arteries (MCA): Normal. VENOUS SINUSES: As permitted by contrast timing, patent. ANATOMIC VARIANTS: None Review of the MIP images confirms the above findings. IMPRESSION: 1. Severely limited examination due to patient body habitus. Within that limitation, no visible intracranial arterial occlusion or  high-grade stenosis. Electronically Signed   By: Deatra Robinson M.D.   On: 06/24/2021 01:00   CT Head Wo Contrast  Result Date: 06/23/2021 CLINICAL DATA:  Dizziness EXAM: CT HEAD WITHOUT CONTRAST TECHNIQUE: Contiguous axial images were obtained from the base of the skull through the vertex without intravenous contrast. COMPARISON:  None. FINDINGS: Brain: No evidence of acute infarction, hemorrhage, hydrocephalus, extra-axial collection or mass lesion/mass effect. Vascular: Moderate intracranial arterial vascular calcifications. Skull: Calvarium appears intact. Sinuses/Orbits: Old appearing medial left orbital wall deformity. Opacification of the left frontal sinus and left ethmoid air cells. Mastoid air cells are clear. Other: None. IMPRESSION: 1. No acute intracranial abnormalities. 2. Left medial orbital wall deformity appears old. Opacification of the left ethmoid air cells and frontal sinus, likely inflammatory. If there is clinical history of recent injury to the left orbit, the possibility of acute fracture could be considered. Electronically Signed   By: Burman Nieves M.D.   On: 06/23/2021 22:06   US Carotid Bilateral (at [facility] and [facility] only)  Result Date: 06/24/2021 CLINICAL DATA:  Stroke.  Hypertension and syncope. EXAM: BILATERAL CAROTID DUPLEX ULTRASOUND TECHNIQUE: Wallace Cullens scale imaging, color Doppler and duplex ultrasound were performed of bilateral carotid and vertebral arteries in the neck. COMPARISON:  CT angiography of the head and neck. FINDINGS: Criteria: Quantification of carotid stenosis is based on velocity parameters that correlate the residual internal carotid diameter with NASCET-based stenosis levels, using the diameter of the distal internal carotid lumen as the denominator for stenosis measurement. The following velocity measurements were obtained: RIGHT ICA: 65/16 cm/sec CCA: 69/8 cm/sec SYSTOLIC ICA/CCA RATIO:  1.6 ECA: 121 cm/sec LEFT ICA: Not visualized CCA: 66/6 cm/sec SYSTOLIC  ICA/CCA RATIO: Not obtained due to non visualization of ICA. ECA: Not visualized. RIGHT CAROTID ARTERY: Somewhat limited visualization but flow is demonstrated throughout the carotid bifurcations without obvious calcific plaque formation. Visualized waveforms are normal. RIGHT VERTEBRAL ARTERY:  Antegrade flow is demonstrated. LEFT CAROTID ARTERY: Limited visualization. Flow is demonstrated in the common carotid artery. Poor visualization of the bifurcation and internal carotid artery due to patient's body habitus. Indeterminate examination of the left carotid artery. LEFT VERTEBRAL ARTERY:  Not visualized. IMPRESSION: Technically limited examination. No evidence of hemodynamically significant stenosis of the right internal carotid artery. Left internal carotid artery was not visualized and cannot be evaluated. Electronically Signed   By: Burman Nieves M.D.   On: 06/24/2021 01:53   ECHOCARDIOGRAM COMPLETE BUBBLE STUDY  Result Date: 06/24/2021    ECHOCARDIOGRAM REPORT   Patient Name:   [name] Date of Exam: 06/24/2021 Medical Rec #:  [id number]     Height:       62.0 in Accession #:    [id number]    Weight:       440.0 lb Date of Birth:  08-Apr-1961    BSA:          2.673 m Patient Age:    59 years      BP:           138/64 mmHg Patient Gender: F  HR:           70 bpm. Exam Location:  [facility] Procedure: 2D Echo, Saline Contrast Bubble Study and Intracardiac Opacification            Agent Indications:     Stroke  History:         Patient has prior history of Echocardiogram examinations. Risk                  Factors:Hypertension and Morbid Obesity.  Sonographer:     L Thornton-Maynard Referring Phys:  [id number] Vernetta HoneyJAN A MANSY Diagnosing Phys: Julien Nordmannimothy Gollan MD  Sonographer Comments: Image acquisition challenging due to patient body habitus. IMPRESSIONS  1. Left ventricular ejection fraction, by estimation, is 60 to 65%. The left ventricle has normal function. The left ventricle has no regional wall motion  abnormalities. Left ventricular diastolic parameters are consistent with Grade II diastolic dysfunction (pseudonormalization).  2. Right ventricular systolic function is normal. The right ventricular size is normal. There is normal pulmonary artery systolic pressure.  3. Agitated saline contrast bubble study was grossly negative, with no evidence of any interatrial shunt.  4. Challenging images secondary to body habitus FINDINGS  Left Ventricle: Left ventricular ejection fraction, by estimation, is 60 to 65%. The left ventricle has normal function. The left ventricle has no regional wall motion abnormalities. Definity contrast agent was given IV to delineate the left ventricular  endocardial borders. The left ventricular internal cavity size was normal in size. There is no left ventricular hypertrophy. Left ventricular diastolic parameters are consistent with Grade II diastolic dysfunction (pseudonormalization). Right Ventricle: The right ventricular size is normal. No increase in right ventricular wall thickness. Right ventricular systolic function is normal. There is normal pulmonary artery systolic pressure. The tricuspid regurgitant velocity is 1.33 m/s, and  with an assumed right atrial pressure of 3 mmHg, the estimated right ventricular systolic pressure is 10.1 mmHg. Left Atrium: Left atrial size was normal in size. Right Atrium: Right atrial size was normal in size. Pericardium: There is no evidence of pericardial effusion. Mitral Valve: The mitral valve was not well visualized. No evidence of mitral valve regurgitation. No evidence of mitral valve stenosis. Tricuspid Valve: The tricuspid valve is not well visualized. Tricuspid valve regurgitation is mild . No evidence of tricuspid stenosis. Aortic Valve: The aortic valve was not well visualized. Aortic valve regurgitation is not visualized. No aortic stenosis is present. Aortic valve mean gradient measures 4.0 mmHg. Aortic valve peak gradient measures 10.4  mmHg. Aortic valve area, by VTI measures 1.61 cm. Pulmonic Valve: The pulmonic valve was not well visualized. Pulmonic valve regurgitation is not visualized. No evidence of pulmonic stenosis. Aorta: The aortic root is normal in size and structure. Venous: The pulmonary veins were not well visualized. The inferior vena cava is normal in size with greater than 50% respiratory variability, suggesting right atrial pressure of 3 mmHg. IAS/Shunts: No atrial level shunt detected by color flow Doppler. Agitated saline contrast was given intravenously to evaluate for intracardiac shunting. Agitated saline contrast bubble study was negative, with no evidence of any interatrial shunt.  LEFT VENTRICLE PLAX 2D LVIDd:         4.70 cm  Diastology LVIDs:         2.87 cm  LV e' medial:    9.57 cm/s LV PW:         1.72 cm  LV E/e' medial:  10.6 LV IVS:        1.34 cm  LV e' lateral:   11.40 cm/s LVOT diam:     2.20 cm  LV E/e' lateral: 8.9 LV SV:         50 LV SV Index:   19 LVOT Area:     3.80 cm  RIGHT VENTRICLE RV S prime:     17.40 cm/s TAPSE (M-mode): 3.3 cm LEFT ATRIUM              Index LA diam:        3.80 cm  1.42 cm/m LA Vol (A2C):   59.5 ml  22.26 ml/m LA Vol (A4C):   129.0 ml 48.27 ml/m LA Biplane Vol: 94.7 ml  35.43 ml/m  AORTIC VALVE                   PULMONIC VALVE AV Area (Vmax):    1.19 cm    PV Vmax:       1.05 m/s AV Area (Vmean):   1.51 cm    PV Peak grad:  4.4 mmHg AV Area (VTI):     1.61 cm AV Vmax:           161.00 cm/s AV Vmean:          91.250 cm/s AV VTI:            0.311 m AV Peak Grad:      10.4 mmHg AV Mean Grad:      4.0 mmHg LVOT Vmax:         50.30 cm/s LVOT Vmean:        36.300 cm/s LVOT VTI:          0.132 m LVOT/AV VTI ratio: 0.42  AORTA Ao Root diam: 3.30 cm MITRAL VALVE                TRICUSPID VALVE MV Area (PHT): 3.28 cm     TR Peak grad:   7.1 mmHg MV E velocity: 101.00 cm/s  TR Vmax:        133.00 cm/s MV A velocity: 57.00 cm/s MV E/A ratio:  1.77         SHUNTS                              Systemic VTI:  0.13 m                             Systemic Diam: 2.20 cm Julien Nordmann MD Electronically signed by Julien Nordmann MD Signature Date/Time: 06/24/2021/4:21:59 PM    Final    (Echo, Carotid, EGD, Colonoscopy, ERCP)    Subjective: Patient seen and examined on the day of discharge.  She is stable in no distress.  She is discharged home in stable condition.  Follow-up outpatient PCP.  Discharge Exam: Vitals:   06/24/21 0830 06/24/21 0845  BP: 138/64   Pulse: 67 70  Resp:    Temp:    SpO2:  93%   Vitals:   06/24/21 0730 06/24/21 0800 06/24/21 0830 06/24/21 0845  BP: (!) 160/73 (!) 167/85 138/64   Pulse: 64 61 67 70  Resp:      Temp:      TempSrc:      SpO2: 100% 98%  93%  Weight:      Height:        General: Pt is alert, awake, not in acute distress Cardiovascular: RRR, S1/S2 +, no  rubs, no gallops Respiratory: CTA bilaterally, no wheezing, no rhonchi Abdominal: Soft, NT, ND, bowel sounds + Extremities: no edema, no cyanosis    The results of significant diagnostics from this hospitalization (including imaging, microbiology, ancillary and laboratory) are listed below for reference.     Microbiology: No results found for this or any previous visit (from the past 240 hour(s)).   Labs: BNP (last 3 results) No results for input(s): BNP in the last 8760 hours. Basic Metabolic Panel: Recent Labs  Lab 06/23/21 1811  NA 138  K 4.7  CL 99  CO2 33*  GLUCOSE 121*  BUN 16  CREATININE 0.69  CALCIUM 9.0   Liver Function Tests: No results for input(s): AST, ALT, ALKPHOS, BILITOT, PROT, ALBUMIN in the last 168 hours. No results for input(s): LIPASE, AMYLASE in the last 168 hours. No results for input(s): AMMONIA in the last 168 hours. CBC: Recent Labs  Lab 06/23/21 1811  WBC 7.2  HGB 15.1*  HCT 47.0*  MCV 108.3*  PLT 184   Cardiac Enzymes: No results for input(s): CKTOTAL, CKMB, CKMBINDEX, TROPONINI in the last 168 hours. BNP: Invalid input(s):  POCBNP CBG: No results for input(s): GLUCAP in the last 168 hours. D-Dimer No results for input(s): DDIMER in the last 72 hours. Hgb A1c No results for input(s): HGBA1C in the last 72 hours. Lipid Profile Recent Labs    06/24/21 0650  CHOL 157  HDL 77  LDLCALC 72  TRIG 39  CHOLHDL 2.0   Thyroid function studies No results for input(s): TSH, T4TOTAL, T3FREE, THYROIDAB in the last 72 hours.  Invalid input(s): FREET3 Anemia work up No results for input(s): VITAMINB12, FOLATE, FERRITIN, TIBC, IRON, RETICCTPCT in the last 72 hours. Urinalysis    Component Value Date/Time   COLORURINE AMBER (A) 06/23/2021 2210   APPEARANCEUR CLOUDY (A) 06/23/2021 2210   APPEARANCEUR Cloudy 02/12/2013 1250   LABSPEC 1.024 06/23/2021 2210   LABSPEC 1.016 02/12/2013 1250   PHURINE 7.0 06/23/2021 2210   GLUCOSEU NEGATIVE 06/23/2021 2210   GLUCOSEU Negative 02/12/2013 1250   HGBUR NEGATIVE 06/23/2021 2210   BILIRUBINUR NEGATIVE 06/23/2021 2210   BILIRUBINUR Negative 02/12/2013 1250   KETONESUR 5 (A) 06/23/2021 2210   PROTEINUR 30 (A) 06/23/2021 2210   NITRITE POSITIVE (A) 06/23/2021 2210   LEUKOCYTESUR TRACE (A) 06/23/2021 2210   LEUKOCYTESUR 2+ 02/12/2013 1250   Sepsis Labs Invalid input(s): PROCALCITONIN,  WBC,  LACTICIDVEN Microbiology No results found for this or any previous visit (from the past 240 hour(s)).   Time coordinating discharge: Over 30 minutes  SIGNED:   Tresa Moore, MD  [facility] 06/24/2021, 4:49 PM Pager   If 7PM-7AM, please contact night-coverage

## 2021-06-24 NOTE — ED Notes (Signed)
MD at the bedside  

## 2021-06-24 NOTE — ED Notes (Addendum)
Patient cleaned up. Fresh linen and pads placed on stretcher. Purewick in place.

## 2021-06-24 NOTE — ED Notes (Signed)
Pt transitioned to bariatric bed at this time.

## 2021-06-24 NOTE — ED Notes (Signed)
Patient transported to CT via stretcher by CT tech x 2

## 2021-06-24 NOTE — Evaluation (Signed)
Physical Therapy Evaluation Patient Details Name: [name]. Mccardle MRN: [id number] DOB: 06/13/1961 Today's Date: 06/24/2021   History of Present Illness  60 y.o. African-American female with medical history significant for morbid obesity, asthma, CHF, lymphedema, depression, GERD, hypertension and obstructive sleep apnea, who presented to the emergency room with acute onset of generalized weakness as well as dizziness with associated vertigo, nausea and vomiting twice since yesterday. Imaging reveals no acute intracranial abnormalities however is noted that body habitus limited results.   Clinical Impression  Patient is a pleasant 60 year old female who presents with dizziness and limited mobility. Prior to hospital admission, pt required assistance with mobility and transfers and lives alone. She lives in an apartment and has an aide 7 days/week with aid present 2-3 hours a day during M-F and one hour a day on S and Su. Patient relies on aide for cooking, cleaning, bathing, dressing, toileting clean up, and transfers.   Patient in bed upon PT and OT co-treat arrival and agreeable to participate in therapy. She is on 2L of oxygen via nasal cannula. 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 Head turns to L and R increased dizziness to the L but not the right however no beating of eye noted, vertical up/down head nods did not increase symptoms. Supine to sit transfer required mod A with assistance for LLE and stabilization upon sitting on EOB due to patient sliding too far. Upon sitting she is dizzy but no noticeable nystagmus beats. Patient able to stand with assistance, side step three steps with assistance to RW and required max A to return to supine after bedding was changed. She was then able to push herself up to top of bed without assistance with bilateral rails.   Pt  would benefit from skilled PT to address noted impairments and functional limitations (see below for any additional details).  Upon hospital discharge, pt would benefit from a manual wheelchair, HHPT, and increased hours from an aide daily due to need for assistance for ADLs.      Follow Up Recommendations Home health PT;Supervision for mobility/OOB    Equipment Recommendations  Wheelchair (measurements PT)    Recommendations for Other Services       Precautions / Restrictions Precautions Precautions: Fall Restrictions Weight Bearing Restrictions: No Other Position/Activity Restrictions: mobility limited by body habitus and pain in LE's      Mobility  Bed Mobility Overal bed mobility: Needs Assistance Bed Mobility: Rolling;Supine to Sit;Sit to Supine Rolling: Min assist;Mod assist   Supine to sit: Mod assist;+2 for physical assistance;HOB elevated;+2 for safety/equipment Sit to supine: Max assist;+2 for physical assistance;HOB elevated;+2 for safety/equipment   General bed mobility comments: Patient requires assistance for LLE to sit EOB, requires x2 assistance and unable to lift legs back into bed. Is able to push herself up to top of bed without assistance in supine position.    Transfers Overall transfer level: Needs assistance Equipment used: Rolling walker (2 wheeled) (bariatric) Transfers: Sit to/from Stand;Lateral/Scoot Transfers Sit to Stand: Min assist;+2 safety/equipment;From elevated surface        Lateral/Scoot Transfers: Min guard;From elevated surface General transfer comment: Requires stabilization of LE's for sit to stand transfer from elevated surface. Dizzy initially upon standing.  Ambulation/Gait             General Gait Details: unable to fully assess due  to fatigue, pain, and dizziness. Patient able to take three lateral steps to reposition in bed with bariatric RW and assistance to walker for placement.  Stairs            Wheelchair  Mobility    Modified Rankin (Stroke Patients Only)       Balance Overall balance assessment: Needs assistance Sitting-balance support: Bilateral upper extremity supported Sitting balance-Leahy Scale: Poor Sitting balance - Comments: Patient sliding off bed upon transition to EOB. once positioned able to stabilize self with UE's.   Standing balance support: Bilateral upper extremity supported Standing balance-Leahy Scale: Fair Standing balance comment: Patient requries heavy BUE support on RW in standing due to pain in LE's.                             Pertinent Vitals/Pain Pain Assessment: 0-10 Pain Score: 8  Pain Location: BLEs Pain Descriptors / Indicators: Aching Pain Intervention(s): Monitored during session;Repositioned    Home Living Family/patient expects to be discharged to:: Private residence Living Arrangements: Alone Available Help at Discharge: Available PRN/intermittently Type of Home: Apartment Home Access: Level entry     Home Layout: One level Home Equipment: Environmental consultant - 2 wheels;Bedside commode;Grab bars - tub/shower;Shower seat Additional Comments: Patient has an aide 7 days a week, one hour on weekends, 2-3 hours during the week.    Prior Function Level of Independence: Needs assistance   Gait / Transfers Assistance Needed: Has a RW but needs assistance occasionally, limited in ambulation, can stand pivot transfer to [facility] but not able to self clean fully.  ADL's / Homemaking Assistance Needed: Aide assists in toileting clean up, dressing, bathing, cooking, etc. Patient does not drive.  Comments: Patient desires wheelchair due to limited mobility and reliance upon aide.     Hand Dominance   Dominant Hand: Right    Extremity/Trunk Assessment   Upper Extremity Assessment Upper Extremity Assessment: Defer to OT evaluation    Lower Extremity Assessment Lower Extremity Assessment: Generalized weakness (grossly 3/5 however is limited by  body habitus for full examination)       Communication   Communication: No difficulties  Cognition Arousal/Alertness: Awake/alert Behavior During Therapy: WFL for tasks assessed/performed Overall Cognitive Status: Within Functional Limits for tasks assessed                                 General Comments: Patient A and O x 4, aware of limitations      General Comments General comments (skin integrity, edema, etc.): skin breakdown of L shin, noticeable odor and moisture from body habitus folds indicating poor care at home.    Exercises Other Exercises Other Exercises: Patient educated on role of PT in acute care setting, transfers, and safe bed mobility. Vestibular: eye tracking with L difficulty adducting (patient reports is baseline), able to turn head left and right (dizziness upon turning left), vertically without dizziness.   Assessment/Plan    PT Assessment Patient needs continued PT services  PT Problem List Decreased strength;Decreased activity tolerance;Decreased balance;Decreased mobility;Obesity;Pain       PT Treatment Interventions DME instruction;Gait training;Functional mobility training;Therapeutic activities;Therapeutic exercise;Manual techniques;Wheelchair mobility training;Patient/family education;Neuromuscular re-education;Balance training    PT Goals (Current goals can be found in the Care Plan section)  Acute Rehab PT Goals Patient Stated Goal: to get a wheelchair so I can move around at home PT Goal Formulation: With  patient Time For Goal Achievement: 07/08/21 Potential to Achieve Goals: Fair    Frequency Min 2X/week   Barriers to discharge Decreased caregiver support patient needs aide hours increased    Co-evaluation PT/OT/SLP Co-Evaluation/Treatment: Yes Reason for Co-Treatment: Complexity of the patient's impairments (multi-system involvement);For patient/therapist safety;To address functional/ADL transfers PT goals addressed  during session: Mobility/safety with mobility;Balance;Strengthening/ROM;Proper use of DME OT goals addressed during session: ADL's and self-care;Proper use of Adaptive equipment and DME;Strengthening/ROM       AM-PAC PT "6 Clicks" Mobility  Outcome Measure Help needed turning from your back to your side while in a flat bed without using bedrails?: A Lot Help needed moving from lying on your back to sitting on the side of a flat bed without using bedrails?: A Lot Help needed moving to and from a bed to a chair (including a wheelchair)?: A Lot Help needed standing up from a chair using your arms (e.g., wheelchair or bedside chair)?: A Little Help needed to walk in hospital room?: A Lot Help needed climbing 3-5 steps with a railing? : Total 6 Click Score: 12    End of Session Equipment Utilized During Treatment: Gait belt;Oxygen (2 L via nasal cannula) Activity Tolerance: Patient tolerated treatment well;Patient limited by fatigue Patient left: in bed;with call bell/phone within reach Nurse Communication: Mobility status PT Visit Diagnosis: Unsteadiness on feet (R26.81);Other abnormalities of gait and mobility (R26.89);Muscle weakness (generalized) (M62.81);BPPV;Difficulty in walking, not elsewhere classified (R26.2);Dizziness and giddiness (R42) BPPV - Right/Left : Left (possible, unable to test due to patient body habitus and being on ED bed (too elevated for safe attempt))    Time: 0933-1006 PT Time Calculation (min) (ACUTE ONLY): 33 min   Charges:   PT Evaluation $PT Eval Moderate Complexity: 1 Mod          Precious Bard, PT, DPT  06/24/2021, 10:43 AM

## 2021-06-24 NOTE — Care Management (Signed)
Brief notes.  Patient presented with chief complaint of dizziness, limited mobility associated with intractable nausea and vomiting.  She was started on IV fluids and dual antiplatelet therapy was initiated given concern for acute CVA.  MRI not a possibility due to patient body habitus.  CTA limited by body habitus but no large vessel occlusion noted.  Carotid Doppler also limited by body habitus but no hemodynamically significant stenosis noted.  Echocardiogram is pending.  Discussed with neurology, Dr. Selina Cooley will evaluate patient.  If neurology evaluates and is comfortable and results of echocardiogram are reassuring can possibly discharge home today with home health services.  Lolita Patella MD

## 2021-06-25 LAB — HEMOGLOBIN A1C
Hgb A1c MFr Bld: 5.7 % — ABNORMAL HIGH (ref 4.8–5.6)
Mean Plasma Glucose: 117 mg/dL

## 2021-07-05 DIAGNOSIS — M6281 Muscle weakness (generalized): Secondary | ICD-10-CM | POA: Diagnosis not present

## 2021-07-05 DIAGNOSIS — R32 Unspecified urinary incontinence: Secondary | ICD-10-CM | POA: Diagnosis not present

## 2021-07-05 DIAGNOSIS — R062 Wheezing: Secondary | ICD-10-CM | POA: Diagnosis not present

## 2021-07-05 DIAGNOSIS — I5020 Unspecified systolic (congestive) heart failure: Secondary | ICD-10-CM | POA: Diagnosis not present

## 2021-07-22 DIAGNOSIS — R062 Wheezing: Secondary | ICD-10-CM | POA: Diagnosis not present

## 2021-07-22 DIAGNOSIS — I5020 Unspecified systolic (congestive) heart failure: Secondary | ICD-10-CM | POA: Diagnosis not present

## 2021-07-22 DIAGNOSIS — M6281 Muscle weakness (generalized): Secondary | ICD-10-CM | POA: Diagnosis not present

## 2021-08-05 DIAGNOSIS — M6281 Muscle weakness (generalized): Secondary | ICD-10-CM | POA: Diagnosis not present

## 2021-08-05 DIAGNOSIS — R062 Wheezing: Secondary | ICD-10-CM | POA: Diagnosis not present

## 2021-08-05 DIAGNOSIS — I5020 Unspecified systolic (congestive) heart failure: Secondary | ICD-10-CM | POA: Diagnosis not present

## 2021-08-05 DIAGNOSIS — R32 Unspecified urinary incontinence: Secondary | ICD-10-CM | POA: Diagnosis not present

## 2021-08-22 DIAGNOSIS — I5020 Unspecified systolic (congestive) heart failure: Secondary | ICD-10-CM | POA: Diagnosis not present

## 2021-08-22 DIAGNOSIS — M6281 Muscle weakness (generalized): Secondary | ICD-10-CM | POA: Diagnosis not present

## 2021-08-22 DIAGNOSIS — R062 Wheezing: Secondary | ICD-10-CM | POA: Diagnosis not present

## 2021-09-09 DIAGNOSIS — I5020 Unspecified systolic (congestive) heart failure: Secondary | ICD-10-CM | POA: Diagnosis not present

## 2021-09-09 DIAGNOSIS — M6281 Muscle weakness (generalized): Secondary | ICD-10-CM | POA: Diagnosis not present

## 2021-09-09 DIAGNOSIS — R32 Unspecified urinary incontinence: Secondary | ICD-10-CM | POA: Diagnosis not present

## 2021-09-09 DIAGNOSIS — R062 Wheezing: Secondary | ICD-10-CM | POA: Diagnosis not present

## 2021-09-15 ENCOUNTER — Telehealth: Payer: Medicaid Other | Admitting: Emergency Medicine

## 2021-09-15 DIAGNOSIS — N3001 Acute cystitis with hematuria: Secondary | ICD-10-CM

## 2021-09-15 MED ORDER — CEPHALEXIN 500 MG PO CAPS: 500.0000 mg | ORAL_CAPSULE | Freq: Two times a day (BID) | ORAL | 0 refills | Status: DC

## 2021-09-15 NOTE — Patient Instructions (Signed)
  [name]. Clelia Croft, thank you for joining Cathlyn Parsons, NP for today's virtual visit.  While this provider is not your primary care provider (PCP), if your PCP is located in our provider database this encounter information will be shared with them immediately following your visit.  Consent: (Patient) [name]. Racey provided verbal consent for this virtual visit at the beginning of the encounter.  Current Medications:  Current Outpatient Medications:    atenolol (TENORMIN) 50 MG tablet, Take 1 tablet (50 mg total) by mouth 2 (two) times daily., Disp: 60 tablet, Rfl: 5   bismuth subsalicylate (PEPTO BISMOL) 262 MG/15ML suspension, Take 30 mLs by mouth every 6 (six) hours as needed., Disp: , Rfl:    calcium carbonate (TUMS - DOSED IN MG ELEMENTAL CALCIUM) 500 MG chewable tablet, Chew 1 tablet by mouth daily as needed for indigestion or heartburn., Disp: , Rfl:    Cholecalciferol 250 MCG (10000 UT) CAPS, Take 10,000 Units by mouth once a week., Disp: , Rfl:    Cyanocobalamin 1000 MCG CAPS, Take by mouth daily., Disp: , Rfl:    diclofenac Sodium (VOLTAREN) 1 % GEL, Apply 2 g topically 4 (four) times daily., Disp: , Rfl:    Dimethicone-Zinc Oxide 5-5 % CREA, Apply 1 application topically as needed., Disp: , Rfl:    furosemide (LASIX) 80 MG tablet, Take 1 tablet (80 mg total) by mouth daily., Disp: 30 tablet, Rfl: 5   losartan (COZAAR) 100 MG tablet, Take 100 mg by mouth daily., Disp: , Rfl:    Menthol, Topical Analgesic, (BIOFREEZE) 4 % GEL, Apply topically., Disp: , Rfl:    metolazone (ZAROXOLYN) 2.5 MG tablet, Take by mouth. 2.5 mg daily x2 x week, Disp: , Rfl:    OXYGEN, Inhale 2 L into the lungs continuous., Disp: , Rfl:    Medications ordered in this encounter:  No orders of the defined types were placed in this encounter.    *If you need refills on other medications prior to your next appointment, please contact your pharmacy*  Follow-Up: Call back or seek an in-person evaluation if the  symptoms worsen or if the condition fails to improve as anticipated.  Other Instructions Finish the antibiotics as prescribed. If you feel you are not getting better or if you feel you are getting worse, please see your primary healthcare provider.    If you have been instructed to have an in-person evaluation today at a local Urgent Care facility, please use the link below. It will take you to a list of all of our available [facility], including address, phone number and hours of operation. Please do not delay care.  [facility]  If you or a family member do not have a primary care provider, use the link below to schedule a visit and establish care. When you choose a [facility] primary care physician or advanced practice provider, you gain a long-term partner in health. Find a Primary Care Provider  Learn more about [facility]'s in-office and virtual care options: [facility] - Get Care Now

## 2021-09-15 NOTE — Addendum Note (Signed)
Addended by: Cathlyn Parsons on: 09/15/2021 10:59 AM   Modules accepted: Level of Service

## 2021-09-15 NOTE — Progress Notes (Signed)
Virtual Visit Consent   [name]. Sidney, you are scheduled for a virtual visit with a [facility] provider today.     Just as with appointments in the office, your consent must be obtained to participate.  Your consent will be active for this visit and any virtual visit you may have with one of our providers in the next 365 days.     If you have a MyChart account, a copy of this consent can be sent to you electronically.  All virtual visits are billed to your insurance company just like a traditional visit in the office.    As this is a virtual visit, video technology does not allow for your provider to perform a traditional examination.  This may limit your provider's ability to fully assess your condition.  If your provider identifies any concerns that need to be evaluated in person or the need to arrange testing (such as labs, EKG, etc.), we will make arrangements to do so.     Although advances in technology are sophisticated, we cannot ensure that it will always work on either your end or our end.  If the connection with a video visit is poor, the visit may have to be switched to a telephone visit.  With either a video or telephone visit, we are not always able to ensure that we have a secure connection.     I need to obtain your verbal consent now.   Are you willing to proceed with your visit today?    Bailey Hurl. [name] has provided verbal consent on 09/15/2021 for a virtual visit (video or telephone).   Bailey Parsons, NP   Date: 09/15/2021 10:53 AM   Virtual Visit via Video Note   I, [name], connected with  [name]  ([id number], [birth year]) on 09/15/21 at 10:45 AM EDT by a video-enabled telemedicine application and verified that I am speaking with the correct person using two identifiers.  Location: Patient: Virtual Visit Location Patient: Home Provider: Virtual Visit Location Provider: Home Office   I discussed the limitations of evaluation and management by telemedicine  and the availability of in person appointments. The patient expressed understanding and agreed to proceed.    History of Present Illness: [name] is a 60 y.o. who identifies as a female who was assigned female at birth, and is being seen today for UTI symtpoms. Pt reports burning with urination and hematuria since 09/11/21, feels like she has a UTI. She reports last UTI was 06/23/21 when she was hospitalized overnight for dizziness at [facility]. Review of records shows she received rocephin for possible UTI but it was discontinued and asymptomatic bacteriuria was suspected; she was not discharged on antibiotics. Pt reports mild suprapubic pain only with urination. Denies nausea, vomiting, and flank pain. Felt chills last night but did not take temperature.   Pt reports does not have hx of recurrent UTI, can't remember when prior UTI (prior to 06/23/21) was.    HPI: HPI  Problems:  Patient Active Problem List   Diagnosis Date Noted   CVA (cerebral vascular accident) (HCC) 06/23/2021   Macrocytosis 12/07/2020   Erythrocytosis 12/07/2020   Depression 11/12/2017   (HFpEF) heart failure with preserved ejection fraction (HCC) 07/22/2017   Chronic diastolic heart failure (HCC) 11/30/2015   Benign essential HTN 11/30/2015   Obstructive sleep apnea 11/30/2015   Lymphedema of both lower extremities 11/30/2015   Cellulitis 11/01/2015    Allergies: No Known Allergies Medications:  Current Outpatient Medications:    atenolol (TENORMIN) 50 MG tablet, Take 1 tablet (50 mg total) by mouth 2 (two) times daily., Disp: 60 tablet, Rfl: 5   bismuth subsalicylate (PEPTO BISMOL) 262 MG/15ML suspension, Take 30 mLs by mouth every 6 (six) hours as needed., Disp: , Rfl:    calcium carbonate (TUMS - DOSED IN MG ELEMENTAL CALCIUM) 500 MG chewable tablet, Chew 1 tablet by mouth daily as needed for indigestion or heartburn., Disp: , Rfl:    Cholecalciferol 250 MCG (10000 UT) CAPS, Take 10,000 Units by mouth once a week.,  Disp: , Rfl:    Cyanocobalamin 1000 MCG CAPS, Take by mouth daily., Disp: , Rfl:    diclofenac Sodium (VOLTAREN) 1 % GEL, Apply 2 g topically 4 (four) times daily., Disp: , Rfl:    Dimethicone-Zinc Oxide 5-5 % CREA, Apply 1 application topically as needed., Disp: , Rfl:    furosemide (LASIX) 80 MG tablet, Take 1 tablet (80 mg total) by mouth daily., Disp: 30 tablet, Rfl: 5   losartan (COZAAR) 100 MG tablet, Take 100 mg by mouth daily., Disp: , Rfl:    Menthol, Topical Analgesic, (BIOFREEZE) 4 % GEL, Apply topically., Disp: , Rfl:    metolazone (ZAROXOLYN) 2.5 MG tablet, Take by mouth. 2.5 mg daily x2 x week, Disp: , Rfl:    OXYGEN, Inhale 2 L into the lungs continuous., Disp: , Rfl:   Observations/Objective: Patient is well-developed, well-nourished in no acute distress.  Resting comfortably  at home.  Head is normocephalic, atraumatic.  No labored breathing. Nasal cannula O2 in place Speech is clear and coherent with logical content.  Patient is alert and oriented at baseline.    Assessment and Plan: 1. Acute cystitis with hematuria Sx consistent with UTI, rx keflex. Reviewed reasons for seeking f/u care.    Follow Up Instructions: I discussed the assessment and treatment plan with the patient. The patient was provided an opportunity to ask questions and all were answered. The patient agreed with the plan and demonstrated an understanding of the instructions.  A copy of instructions were sent to the patient via MyChart unless otherwise noted below.    The patient was advised to call back or seek an in-person evaluation if the symptoms worsen or if the condition fails to improve as anticipated.  Time:  I spent 10 minutes with the patient via telehealth technology discussing the above problems/concerns.    Bailey Parsons, NP

## 2021-09-21 DIAGNOSIS — M6281 Muscle weakness (generalized): Secondary | ICD-10-CM | POA: Diagnosis not present

## 2021-09-21 DIAGNOSIS — I5020 Unspecified systolic (congestive) heart failure: Secondary | ICD-10-CM | POA: Diagnosis not present

## 2021-09-21 DIAGNOSIS — R062 Wheezing: Secondary | ICD-10-CM | POA: Diagnosis not present

## 2021-10-01 ENCOUNTER — Telehealth: Payer: Self-pay | Admitting: Internal Medicine

## 2021-10-01 NOTE — Telephone Encounter (Signed)
  Patient Consent for Virtual Visit        [name]. [name] has provided verbal consent on 10/01/2021 for a virtual visit (video or telephone).   CONSENT FOR VIRTUAL VISIT FOR:  [name]. Schlie  By participating in this virtual visit I agree to the following:  I hereby voluntarily request, consent and authorize [facility] and its employed or contracted physicians, physician assistants, nurse practitioners or other licensed health care professionals (the Practitioner), to provide me with telemedicine health care services (the "Services") as deemed necessary by the treating Practitioner. I acknowledge and consent to receive the Services by the Practitioner via telemedicine. I understand that the telemedicine visit will involve communicating with the Practitioner through live audiovisual communication technology and the disclosure of certain medical information by electronic transmission. I acknowledge that I have been given the opportunity to request an in-person assessment or other available alternative prior to the telemedicine visit and am voluntarily participating in the telemedicine visit.  I understand that I have the right to withhold or withdraw my consent to the use of telemedicine in the course of my care at any time, without affecting my right to future care or treatment, and that the Practitioner or I may terminate the telemedicine visit at any time. I understand that I have the right to inspect all information obtained and/or recorded in the course of the telemedicine visit and may receive copies of available information for a reasonable fee.  I understand that some of the potential risks of receiving the Services via telemedicine include:  Delay or interruption in medical evaluation due to technological equipment failure or disruption; Information transmitted may not be sufficient (e.g. poor resolution of images) to allow for appropriate medical decision making by the Practitioner; and/or   In rare instances, security protocols could fail, causing a breach of personal health information.  Furthermore, I acknowledge that it is my responsibility to provide information about my medical history, conditions and care that is complete and accurate to the best of my ability. I acknowledge that Practitioner's advice, recommendations, and/or decision may be based on factors not within their control, such as incomplete or inaccurate data provided by me or distortions of diagnostic images or specimens that may result from electronic transmissions. I understand that the practice of medicine is not an exact science and that Practitioner makes no warranties or guarantees regarding treatment outcomes. I acknowledge that a copy of this consent can be made available to me via my patient portal [facility] MyChart), or I can request a printed copy by calling the office of [facility].    I understand that my insurance will be billed for this visit.   I have read or had this consent read to me. I understand the contents of this consent, which adequately explains the benefits and risks of the Services being provided via telemedicine.  I have been provided ample opportunity to ask questions regarding this consent and the Services and have had my questions answered to my satisfaction. I give my informed consent for the services to be provided through the use of telemedicine in my medical care

## 2021-10-03 NOTE — Progress Notes (Signed)
Virtual Visit via Telephone Note   This visit type was conducted due to national recommendations for restrictions regarding the COVID-19 Pandemic (e.g. social distancing) in an effort to limit this patient's exposure and mitigate transmission in our community.  Due to her co-morbid illnesses, this patient is at least at moderate risk for complications without adequate follow up.  This format is felt to be most appropriate for this patient at this time.  The patient did not have access to video technology/had technical difficulties with video requiring transitioning to audio format only (telephone).  All issues noted in this document were discussed and addressed.  No physical exam could be performed with this format.  Please refer to the patient's chart for her  consent to telehealth for [facility].    Date:  10/04/2021   ID:  [name]. Ann, Bohne [birth year], MRN [id number] The patient was identified using 2 identifiers.  Patient Location: Home Provider Location: Office/Clinic   PCP:  Gracelyn Nurse, MD   [facility] Providers Cardiologist:  Yvonne Kendall, MD {  Evaluation Performed:  Follow-Up Visit  Chief Complaint:  Chest pain  History of Present Illness:    [name] is a 60 y.o. female with history of chronic HFrEF, hypertension, morbid obesity, obstructive sleep apnea, lipidemia, GERD, and depression.  We are speaking today for evaluation of chest pain and chronic HFpEF.  She was last seen in person in our office in 06/2018.  She had a subsequent telehealth visit with [name] in 03/2019, at which time she was feeling fairly well from a heart standpoint.  She was seen in the heart failure clinic by Clarisa Kindred, NP, this past March, at which time she was felt to have NYHA class II symptoms.  No medication changes were made.  She presented to the [facility] emergency department and July complaining of dizziness.  She underwent evaluation for possible stroke; work-up was unrevealing,  though this was degraded by the patient's body habitus.  Today, [name] reports that she is feeling fairly well though she has noticed intermittent chest pain over the last few months.  It typically happens when she lies down and feels like indigestion.  It happens every other day on average and typically improves when she uses Pepto-Bismol or Tums.  She has taken omeprazole in the past with some relief as well but is not using a PPI at this time.  There are no associated symptoms.  She reports stable exertional dyspnea and leg edema, controlled with her regimen of furosemide and metolazone.  She ran out of losartan about a month ago.  Her blood pressure today is somewhat elevated, though while on losartan was typically better with systolic readings in the 130s.  She notes occasional lightheadedness, similar to but less frequent/severe than what brought her to the ED with concern for stroke in July.  She has not had any new focal neurologic deficits.  She reports having lost about 45 pounds over the last year due to dietary changes.  She tries to walk some in her apartment but is not able to go out to exercise regularly due to transportation problems.   Past Medical History:  Diagnosis Date   Asthma 01/25/2018   CHF (congestive heart failure) (HCC)    Depression    GERD (gastroesophageal reflux disease)    Hypertension    Lymphedema    Morbid obesity (HCC)    Sleep apnea    History reviewed. No pertinent surgical history.  Current Meds  Medication Sig   atenolol (TENORMIN) 50 MG tablet Take 1 tablet (50 mg total) by mouth 2 (two) times daily.   bismuth subsalicylate (PEPTO BISMOL) 262 MG/15ML suspension Take 30 mLs by mouth every 6 (six) hours as needed.   calcium carbonate (TUMS - DOSED IN MG ELEMENTAL CALCIUM) 500 MG chewable tablet Chew 1 tablet by mouth daily as needed for indigestion or heartburn.   Cholecalciferol (VITAMIN D-3) 125 MCG (5000 UT) TABS Take 1 tablet by mouth daily.    Cyanocobalamin 1000 MCG CAPS Take by mouth daily.   diclofenac Sodium (VOLTAREN) 1 % GEL Apply topically 4 (four) times daily as needed.   Dimethicone-Zinc Oxide 5-5 % CREA Apply 1 application topically as needed.   furosemide (LASIX) 80 MG tablet Take 1 tablet (80 mg total) by mouth daily.   Menthol, Topical Analgesic, (BIOFREEZE) 4 % GEL Apply topically.   METOLAZONE PO Take 2.5 mg by mouth 2 (two) times a week.   OXYGEN Inhale 4 L into the lungs continuous.     Allergies:   Patient has no known allergies.   Social History   Tobacco Use   Smoking status: Former    Years: 10.00    Types: Cigarettes    Quit date: 11/06/2013    Years since quitting: 7.9   Smokeless tobacco: Never   Tobacco comments:    quit 7 years ago  Vaping Use   Vaping Use: Never used  Substance Use Topics   Alcohol use: Not Currently    Alcohol/week: 1.0 standard drink    Types: 1 Standard drinks or equivalent per week    Comment: occasional alcohol use on weekend   Drug use: No     Family Hx: The patient's family history includes CVA in her father; Cancer in her paternal grandmother; Hypertension in her father and mother; Kidney disease in her mother; Seizures in her father.  ROS:   Please see the history of present illness.   All other systems reviewed and are negative.   Prior CV studies:   The following studies were reviewed today:  Echo with bubble study (06/24/2021):  1. Left ventricular ejection fraction, by estimation, is 60 to 65%. The  left ventricle has normal function. The left ventricle has no regional  wall motion abnormalities. Left ventricular diastolic parameters are  consistent with Grade II diastolic  dysfunction (pseudonormalization).   2. Right ventricular systolic function is normal. The right ventricular  size is normal. There is normal pulmonary artery systolic pressure.   3. Agitated saline contrast bubble study was grossly negative, with no  evidence of any interatrial  shunt.   4. Challenging images secondary to body habitus   Labs/Other Tests and Data Reviewed:    EKG:  An ECG dated 06/23/2021 was personally reviewed today and demonstrated:  normal sinus rhythm without abnormality.  Recent Labs: 11/06/2020: ALT 13; TSH 2.198 06/23/2021: BUN 16; Creatinine, Ser 0.69; Hemoglobin 15.1; Platelets 184; Potassium 4.7; Sodium 138   Recent Lipid Panel Lab Results  Component Value Date/Time   CHOL 157 06/24/2021 06:50 AM   TRIG 39 06/24/2021 06:50 AM   HDL 77 06/24/2021 06:50 AM   CHOLHDL 2.0 06/24/2021 06:50 AM   LDLCALC 72 06/24/2021 06:50 AM    Wt Readings from Last 3 Encounters:  10/04/21 (!) 436 lb (197.8 kg)  06/23/21 (!) 440 lb (199.6 kg)  02/25/21 (!) 455 lb (206.4 kg)     Objective:    Vital Signs:  BP Marland Kitchen)  152/83   Pulse 71   Ht 5\' 2"  (1.575 m)   Wt (!) 436 lb (197.8 kg)   SpO2 99% Comment: on 4L O2  BMI 79.75 kg/m    VITAL SIGNS:  reviewed  ASSESSMENT & PLAN:    Chest pain: Symptoms most consistent with GERD.  I have encouraged [name] to try taking omeprazole 20 mg daily.  We will have her follow-up in the office in 2 to 3 weeks for further evaluation.  I have advised her to seek immediate medical attention if she has worsening chest pain.  Chronic HFpEF: Symptoms sound stable though assessment is limited by telephone visit and poor functional capacity.  We will restart losartan given elevated blood pressure, with plans for BMP at follow-up in 2-3 weeks.  [name] should continue her current diuretic regimen of furosemide and metolazone.  Hypertension: Blood pressure suboptimally controlled today.  We will have [name] restart losartan 100 mg daily with close outpatient follow-up.  Morbid obesity: I congratulated [name] on her weight loss over the last year and encouraged her to continue working on this through diet and exercise.  Time:   Today, I have spent 11 minutes with the patient with telehealth technology  discussing the above problems.     Medication Adjustments/Labs and Tests Ordered: Current medicines are reviewed at length with the patient today.  Concerns regarding medicines are outlined above.   Tests Ordered: Obtain BMP at follow-up visit in 2-3 weeks.   Medication Changes: Restart losartan 100 mg daily. Start omeprazole 20 mg daily.   Follow Up:  In Person in 2-3 weeks.  Signed, Clelia Croft, MD  10/04/2021 8:59 AM    [facility]

## 2021-10-04 ENCOUNTER — Encounter: Payer: Self-pay | Admitting: Internal Medicine

## 2021-10-04 ENCOUNTER — Telehealth: Payer: Self-pay | Admitting: Internal Medicine

## 2021-10-04 ENCOUNTER — Other Ambulatory Visit: Payer: Self-pay

## 2021-10-04 ENCOUNTER — Telehealth (INDEPENDENT_AMBULATORY_CARE_PROVIDER_SITE_OTHER): Payer: Medicaid Other | Admitting: Internal Medicine

## 2021-10-04 VITALS — BP 152/83 | HR 71 | Ht 62.0 in | Wt >= 6400 oz

## 2021-10-04 DIAGNOSIS — R079 Chest pain, unspecified: Secondary | ICD-10-CM

## 2021-10-04 DIAGNOSIS — I10 Essential (primary) hypertension: Secondary | ICD-10-CM | POA: Diagnosis not present

## 2021-10-04 DIAGNOSIS — I5032 Chronic diastolic (congestive) heart failure: Secondary | ICD-10-CM

## 2021-10-04 MED ORDER — LOSARTAN POTASSIUM 100 MG PO TABS: 100.0000 mg | ORAL_TABLET | Freq: Every day | ORAL | 0 refills | Status: DC

## 2021-10-04 MED ORDER — OMEPRAZOLE MAGNESIUM 20 MG PO TBEC: 20.0000 mg | DELAYED_RELEASE_TABLET | Freq: Every day | ORAL | 0 refills | Status: DC

## 2021-10-04 NOTE — Patient Instructions (Addendum)
It was a pleasure speaking with you today!  Medication Instructions:  - Your physician has recommended you make the following change in your medication:   1) START omeprazole 20 mg- take 1 tablet by mouth once daily   2) Losartan has been refilled   *If you need a refill on your cardiac medications before your next appointment, please call your pharmacy*   Lab Work: - Your physician recommends that you have lab work at your next office visit- BMP  If you have labs (blood work) drawn today and your tests are completely normal, you will receive your results only by: MyChart Message (if you have MyChart) OR A paper copy in the mail If you have any lab test that is abnormal or we need to change your treatment, we will call you to review the results.   Testing/Procedures: - none ordered   Follow-Up: At [facility], you and your health needs are our priority.  As part of our continuing mission to provide you with exceptional heart care, we have created designated Provider Care Teams.  These Care Teams include your primary Cardiologist (physician) and Advanced Practice Providers (APPs -  Physician Assistants and Nurse Practitioners) who all work together to provide you with the care you need, when you need it.  We recommend signing up for the patient portal called "MyChart".  Sign up information is provided on this After Visit Summary.  MyChart is used to connect with patients for Virtual Visits (Telemedicine).  Patients are able to view lab/test results, encounter notes, upcoming appointments, etc.  Non-urgent messages can be sent to your provider as well.   To learn more about what you can do with MyChart, go to [email or web address].    Your next appointment:   2-3 week(s)  The format for your next appointment:   In Person  Provider:   You may see Yvonne Kendall, MD or one of the following Advanced Practice Providers on your designated Care Team:   Nicolasa Ducking, NP Eula Listen, PA-C Marisue Ivan, PA-C Cadence Fransico Michael, [place]   Other Instructions  Omeprazole Tablets What is this medication? OMEPRAZOLE (oh ME pray zol) is used to treat heartburn, stomach ulcers, reflux disease, or other conditions that cause too much stomach acid. It works by reducing the amount of acid in the stomach. It belongs to a group of medications called PPIs. This medicine may be used for other purposes; ask your health care provider or pharmacist if you have questions. COMMON BRAND NAME(S): Prilosec OTC What should I tell my care team before I take this medication? They need to know if you have any of these conditions: Chest pain Have had heartburn for over 3 months Have heartburn with dizziness, lightheadedness or sweating Liver disease Low levels of calcium, magnesium, or potassium in the blood Lupus Stomach pain Trouble swallowing Unexplained weight loss Vomiting with blood Wheezing An unusual or allergic reaction to omeprazole, other medications, foods, dyes, or preservatives Pregnant or trying to get pregnant Breast-feeding How should I use this medication? Take this medication by mouth with a glass of water. Follow the directions on the product label. Do not cut, crush or chew this medication. Swallow the tablets whole. Take this medication on an empty stomach, at least 30 minutes before breakfast. Take your medication at regular intervals. Do not take it more often than directed. Talk to your care team about the use of this medication in children. Special care may be needed. Overdosage: If you think you have  taken too much of this medicine contact a poison control center or emergency room at once. NOTE: This medicine is only for you. Do not share this medicine with others. What if I miss a dose? If you miss a dose, take it as soon as you can. If it is almost time for your next dose, take only that dose. Do not take double or extra doses. What may interact with this  medication? Do not take this medication with any of the following: Atazanavir Clopidogrel Nelfinavir Rilpivirine This medication may also interact with the following: Antifungals like itraconazole, ketoconazole, and voriconazole Certain antivirals for HIV or hepatitis Certain medications that treat or prevent blood clots like warfarin Cilostazol Citalopram Cyclosporine Dasatinib Digoxin Disulfiram Diuretics Erlotinib Iron supplements Medications for anxiety, panic, and sleep like diazepam Medications for seizures like carbamazepine, phenobarbital, phenytoin Methotrexate Mycophenolate mofetil Nilotinib Rifampin St. John's wort Tacrolimus Vitamin B12 This list may not describe all possible interactions. Give your health care provider a list of all the medicines, herbs, non-prescription drugs, or dietary supplements you use. Also tell them if you smoke, drink alcohol, or use illegal drugs. Some items may interact with your medicine. What should I watch for while using this medication? It can take several days before your stomach pain gets better. Check with your care team if your condition does not start to get better, or if it gets worse. Do not treat diarrhea with over the counter products. Contact your care team if you have diarrhea that lasts more than 2 days or if it is severe and watery. You may need blood work done while you are taking this medication. Using this medication for a long time may weaken your bones. The risk of bone fractures may be increased. Talk to your care team about your bone health. Using this medication for a long time may cause growths (polyps) in the stomach. They usually don't cause any symptoms. They are usually not cancerous. Contact your care team if you notice pain or tenderness when you press your stomach, have nausea, or see bloody or black, tar-like stools. This medication may cause a decrease in vitamin B12. You should make sure that you get  enough vitamin B12 while you are taking this medication. Discuss the foods you eat and the vitamins you take with your care team. What side effects may I notice from receiving this medication? Side effects that you should report to your care team as soon as possible: Allergic reactions-skin rash, itching, hives, swelling of the face, lips, tongue, or throat Kidney injury-decrease in the amount of urine, swelling of the ankles, hands, or feet Low magnesium level-muscle pain or cramps, unusual weakness, fatigue, fast or irregular heartbeat, tremors Low vitamin B12 level-pain, tingling, or numbness in the hands or feet, muscle weakness, dizziness, confusion, difficulty concentrating Rash on the cheeks or arms that gets worse in the sun Redness, blistering, peeling, or loosening of the skin, including inside the mouth Severe diarrhea, fever Unusual bleeding or bruising Side effects that usually do not require medical attention (report to your care team if they continue or are bothersome): Gas Headache Nausea Stomach pain Vomiting This list may not describe all possible side effects. Call your doctor for medical advice about side effects. You may report side effects to FDA at 1-800-FDA-1088. Where should I keep my medication? Keep out of the reach of children and pets. Store at room temperature between 20 and 25 degrees C (68 and 77 degrees F). Protect from light and  moisture. Get rid of any unused medication after the expiration date. To get rid of medications that are no longer needed or expired: Take the medication to a medication take-back program. Check with your pharmacy or law enforcement to find a location. If you cannot return the medication, check the label or package insert to see if the medication should be thrown out in the garbage or flushed down the toilet. If you are not sure, ask your care team. If it is safe to put in the trash, empty the medication out of the container. Mix the  medication with cat litter, dirt, coffee grounds, or other unwanted substance. Seal the mixture in a bag or container. Put it in the trash. NOTE: This sheet is a summary. It may not cover all possible information. If you have questions about this medicine, talk to your doctor, pharmacist, or health care provider.  2022 Elsevier/Gold Standard (2021-03-04 16:52:23)

## 2021-10-04 NOTE — Telephone Encounter (Signed)
Attempted to schedule.  LMOV to call office.  ° °

## 2021-10-09 DIAGNOSIS — M6281 Muscle weakness (generalized): Secondary | ICD-10-CM | POA: Diagnosis not present

## 2021-10-09 DIAGNOSIS — I5020 Unspecified systolic (congestive) heart failure: Secondary | ICD-10-CM | POA: Diagnosis not present

## 2021-10-09 DIAGNOSIS — R32 Unspecified urinary incontinence: Secondary | ICD-10-CM | POA: Diagnosis not present

## 2021-10-09 DIAGNOSIS — R062 Wheezing: Secondary | ICD-10-CM | POA: Diagnosis not present

## 2021-11-08 ENCOUNTER — Telehealth: Payer: Self-pay | Admitting: Internal Medicine

## 2021-11-08 NOTE — Telephone Encounter (Signed)
Patient states she has changed her insurance, will call to schedule when she receives card.

## 2021-11-08 NOTE — Telephone Encounter (Signed)
-----   Message from Annia Belt, RN sent at 11/07/2021  4:32 PM EST ----- Regarding: f/u Pilar,  Can we call this pt to schedule follow up. There is a telephone note form 10/04/21 that scheduling tried calling once. She was a mychart visit on 10/14 and needed 2-3 week f/u. Can we call and schedule first available for follow up?   Thanks,  [employer]

## 2021-12-18 ENCOUNTER — Telehealth: Payer: Self-pay | Admitting: Internal Medicine

## 2021-12-18 NOTE — Telephone Encounter (Signed)
.. °  Medicaid Managed Care   Unsuccessful Outreach Note  12/18/2021 Name: [name] MRN: [id number] DOB: 11/04/1961  Referred by: Gracelyn Nurse, MD Reason for referral : High Risk Managed Medicaid (I called the patient today to get her scheduled with the MM Team. I left my name and number on her VM.)   An unsuccessful telephone outreach was attempted today. The patient was referred to the case management team for assistance with care management and care coordination.   Follow Up Plan: The care management team will reach out to the patient again over the next 14 days.   Weston Settle Care Guide, High Risk Medicaid Managed Care Embedded Care Coordination [facility]   [facility]

## 2021-12-30 ENCOUNTER — Other Ambulatory Visit: Payer: Self-pay | Admitting: Internal Medicine

## 2021-12-30 NOTE — Telephone Encounter (Signed)
Attempted to schedule no ans no vm  

## 2021-12-30 NOTE — Telephone Encounter (Signed)
Please schedule overdue 2-3 week F/U appointment. Thank you!

## 2022-01-10 NOTE — Telephone Encounter (Signed)
Attempted to schedule no ans no vm  

## 2022-01-15 ENCOUNTER — Telehealth: Payer: Self-pay | Admitting: Internal Medicine

## 2022-01-15 NOTE — Telephone Encounter (Signed)
.. °  Medicaid Managed Care   Unsuccessful Outreach Note  01/15/2022 Name: [name] MRN: [id number] DOB: 11/09/1961  Referred by: Gracelyn Nurse, MD Reason for referral : High Risk Managed Medicaid (I called the patient today to get her scheduled with the MM Team. I left my name and number on her VM.)   A second unsuccessful telephone outreach was attempted today. The patient was referred to the case management team for assistance with care management and care coordination.   Follow Up Plan: The care management team will reach out to the patient again over the next 14 days.    Weston Settle Care Guide, High Risk Medicaid Managed Care Embedded Care Coordination [facility]   [facility]

## 2022-01-20 NOTE — Telephone Encounter (Signed)
Attempted to schedule no ans no vm  

## 2022-02-02 ENCOUNTER — Other Ambulatory Visit: Payer: Self-pay | Admitting: Internal Medicine

## 2022-02-03 NOTE — Telephone Encounter (Signed)
Needs appointment for refills. Thank you! 

## 2022-02-03 NOTE — Telephone Encounter (Signed)
Attempted to schedule no ans no vm  

## 2022-02-13 NOTE — Telephone Encounter (Signed)
Last seen physically 06/2018. Has had 2 telemedicine visits since then. Was advised to F/U in 2-3 weeks during last telehealth visit-October 2022. Unable to reach patient by phone to schedule. Please advise on refill.  Thanks!

## 2022-02-14 NOTE — Telephone Encounter (Signed)
Pt last seen by Dr. Okey Dupre for virtual visit only 10/04/21.   Losartan 100 mg daily restarted at this visit with plan for pt to follow up IN OFFICE 2-3 weeks with BMP.  Attempt to schedule follow up unsuccessful per telephone notes 10/14 and 11/08/21.   Pt has since had CMP completed at [facility] 12/25/21 with stable results per preliminary review.   Will refill pt's Losartan for 30 days only.   Note sent to pharmacy that pt will need appointment with our office for further refills.   Note forwarded to scheduling to attempt to reach pt again to set up follow up appointment.  Attempt made yesterday to reach pt with no answer.

## 2022-02-25 ENCOUNTER — Telehealth: Payer: Self-pay | Admitting: Family

## 2022-02-25 ENCOUNTER — Ambulatory Visit: Payer: Medicaid Other | Admitting: Family

## 2022-02-25 NOTE — Telephone Encounter (Signed)
Patient did not show for her Heart Failure Clinic appointment on 02/25/22. Will attempt to reschedule.   ?

## 2022-03-11 ENCOUNTER — Telehealth: Payer: Self-pay | Admitting: Internal Medicine

## 2022-03-11 NOTE — Telephone Encounter (Signed)
.. ?  Medicaid Managed Care  ? ?Unsuccessful Outreach Note ? ?03/11/2022 ?Name: [name] MRN: [id number] DOB: 1961-10-05 ? ?Referred by: Gracelyn Nurse, MD ?Reason for referral : High Risk Managed Medicaid (I called the patient today to get her scheduled with the MM Team. I left my name and number on her VM.) ? ? ?Third unsuccessful telephone outreach was attempted today. The patient was referred to the case management team for assistance with care management and care coordination. The patient's primary care provider has been notified of our unsuccessful attempts to make or maintain contact with the patient. The care management team is pleased to engage with this patient at any time in the future should he/she be interested in assistance from the care management team.  ? ?Follow Up Plan: We have been unable to make contact with the patient for follow up. The care management team is available to follow up with the patient after provider conversation with the patient regarding recommendation for care management engagement and subsequent re-referral to the care management team.  ? ?Weston Settle ?Care Guide, High Risk Medicaid Managed Care ?Embedded Care Coordination ?[facility]  [facility]  ? ? ? ?

## 2022-03-12 ENCOUNTER — Other Ambulatory Visit: Payer: Self-pay | Admitting: Internal Medicine

## 2022-03-12 NOTE — Telephone Encounter (Signed)
Please contact pt for future appointment. ?Pt overdue for 2-3 wk f/u. ?

## 2022-03-31 NOTE — Telephone Encounter (Signed)
Please see note below. 

## 2022-04-03 ENCOUNTER — Other Ambulatory Visit: Payer: Self-pay | Admitting: Internal Medicine

## 2022-04-03 NOTE — Telephone Encounter (Signed)
Please contact pt for future appointment. ?Pt overdue for 2-3 wk f/u. ?Pt needing refills. ?

## 2022-04-04 NOTE — Telephone Encounter (Signed)
LVM to schedule

## 2022-04-09 NOTE — Telephone Encounter (Signed)
?*  STAT* If patient is at the pharmacy, call can be transferred to refill team. ? ? ?1. Which medications need to be refilled? (please list name of each medication and dose if known) omeprazole and losartan  ? ?2. Which pharmacy/location (including street and city if local pharmacy) is medication to be sent to? [facility] [address]  ? ?3. Do they need a 30 day or 90 day supply? 90 ? ?

## 2022-04-09 NOTE — Telephone Encounter (Signed)
Scheduled

## 2022-04-10 ENCOUNTER — Other Ambulatory Visit: Payer: Self-pay

## 2022-04-10 MED ORDER — OMEPRAZOLE 20 MG PO CPDR: 20.0000 mg | DELAYED_RELEASE_CAPSULE | Freq: Every day | ORAL | 0 refills | Status: DC

## 2022-04-10 MED ORDER — LOSARTAN POTASSIUM 100 MG PO TABS: 100.0000 mg | ORAL_TABLET | Freq: Every day | ORAL | 0 refills | Status: AC

## 2022-04-10 NOTE — Telephone Encounter (Signed)
Requested Prescriptions  ? ?Signed Prescriptions Disp Refills  ? losartan (COZAAR) 100 MG tablet 90 tablet 0  ?  Sig: Take 1 tablet (100 mg total) by mouth daily.  ?  Authorizing Provider: END, CHRISTOPHER  ?  Ordering User: Janan Ridge  ? omeprazole (PRILOSEC) 20 MG capsule 90 capsule 0  ?  Sig: Take 1 capsule (20 mg total) by mouth daily.  ?  Authorizing Provider: END, CHRISTOPHER  ?  Ordering User: Janan Ridge  ? ?

## 2022-04-29 NOTE — Progress Notes (Deleted)
Office Visit    Patient Name: [name] Date of Encounter: 04/29/2022  Primary Care Provider:  Gracelyn NurseJohnston, John D, MD Primary Cardiologist:  Yvonne Kendallhristopher End, MD Primary Electrophysiologist: None  Patient Profile    Chief Complaint: Follow-up for CHF, HTN   Notable History: HFrEF ([place] HA class II) HTN GERD Morbid obesity Sleep apnea on CPAP Asthma    Recent Studies: Echo with bubble study 06/24/2021: Left ventricular ejection fraction, by estimation, is 60 to 65%, grade 2 DD, no RWMA, with negative agitated saline. 06/23/2021 US carotid bilateral: No evidence of hemodynamically significant stenosis of the R ICA, LICA not visualize History of Present Illness    [name] is a 61 y.o. female with PMH as noted above was last seen on 09/2021 by Dr. In by video visit.  During visit patient endorsed intermittent chest pain that she described as indigestion.  She also endorsed stable dyspnea and leg edema that is controlled with regimen of methimazole and furosemide.  She had recently been seen on 06/23/21 for neurological deficits concerning for stroke and carotid US performed revealing no significant stenosis noted.  She reported weight loss of 45 pounds.  She was restarted on losartan 100 mg daily and omeprazole 20 mg for indigestion.  Since last being seen in the office patient reports.  Patient denies chest pain, palpitations, dyspnea, PND, orthopnea, nausea, vomiting, dizziness, syncope, edema, weight gain, or early satiety.   Past Medical History    Past Medical History:  Diagnosis Date   Asthma 01/25/2018   CHF (congestive heart failure) (HCC)    Depression    GERD (gastroesophageal reflux disease)    Hypertension    Lymphedema    Morbid obesity (HCC)    Sleep apnea    No past surgical history on file.  Allergies  No Known Allergies  Home Medications    Current Outpatient Medications  Medication Sig Dispense Refill   atenolol (TENORMIN) 50 MG tablet Take 1  tablet (50 mg total) by mouth 2 (two) times daily. 60 tablet 5   bismuth subsalicylate (PEPTO BISMOL) 262 MG/15ML suspension Take 30 mLs by mouth every 6 (six) hours as needed.     calcium carbonate (TUMS - DOSED IN MG ELEMENTAL CALCIUM) 500 MG chewable tablet Chew 1 tablet by mouth daily as needed for indigestion or heartburn.     Cholecalciferol (VITAMIN D-3) 125 MCG (5000 UT) TABS Take 1 tablet by mouth daily.     Cyanocobalamin 1000 MCG CAPS Take by mouth daily.     diclofenac Sodium (VOLTAREN) 1 % GEL Apply topically 4 (four) times daily as needed.     Dimethicone-Zinc Oxide 5-5 % CREA Apply 1 application topically as needed.     furosemide (LASIX) 80 MG tablet Take 1 tablet (80 mg total) by mouth daily. 30 tablet 5   losartan (COZAAR) 100 MG tablet Take 1 tablet (100 mg total) by mouth daily. 90 tablet 0   Menthol, Topical Analgesic, (BIOFREEZE) 4 % GEL Apply topically.     METOLAZONE PO Take 2.5 mg by mouth 2 (two) times a week.     omeprazole (PRILOSEC) 20 MG capsule Take 1 capsule (20 mg total) by mouth daily. 90 capsule 0   OXYGEN Inhale 4 L into the lungs continuous.     No current facility-administered medications for this visit.     Review of Systems  Please see the history of present illness.    (+)*** (+)***  All other systems reviewed  and are otherwise negative except as noted above.  Physical Exam    Wt Readings from Last 3 Encounters:  10/04/21 (!) 436 lb (197.8 kg)  06/23/21 (!) 440 lb (199.6 kg)  02/25/21 (!) 455 lb (206.4 kg)   [id number] were no vitals filed for this visit.,There is no height or weight on file to calculate BMI.  Constitutional:      Appearance: Healthy appearance. Not in distress.  Neck:     Vascular: JVD normal.  Pulmonary:     Effort: Pulmonary effort is normal.     Breath sounds: No wheezing. No rales. Diminished in the bases Cardiovascular:     Normal rate. Regular rhythm. Normal S1. Normal S2.      Murmurs: There is no murmur.   Edema:    Peripheral edema absent.  Abdominal:     Palpations: Abdomen is soft non tender. There is no hepatomegaly.  Skin:    General: Skin is warm and dry.  Neurological:     General: No focal deficit present.     Mental Status: Alert and oriented to person, place and time.     Cranial Nerves: Cranial nerves are intact.  EKG/LABS/Other Studies Reviewed    ECG personally reviewed by me today - ***  Risk Assessment/Calculations:   {Does this patient have ATRIAL FIBRILLATION?:[id number]}        Lab Results  Component Value Date   WBC 7.2 06/23/2021   HGB 15.1 (H) 06/23/2021   HCT 47.0 (H) 06/23/2021   MCV 108.3 (H) 06/23/2021   PLT 184 06/23/2021   Lab Results  Component Value Date   CREATININE 0.69 06/23/2021   BUN 16 06/23/2021   NA 138 06/23/2021   K 4.7 06/23/2021   CL 99 06/23/2021   CO2 33 (H) 06/23/2021   Lab Results  Component Value Date   ALT 13 11/06/2020   AST 21 11/06/2020   ALKPHOS 64 11/06/2020   BILITOT 1.1 11/06/2020   Lab Results  Component Value Date   CHOL 157 06/24/2021   HDL 77 06/24/2021   LDLCALC 72 06/24/2021   TRIG 39 06/24/2021   CHOLHDL 2.0 06/24/2021    Lab Results  Component Value Date   HGBA1C 5.7 (H) 06/24/2021    Assessment & Plan    1.  Chronic HFpEF: -  2.  HTN  3.  Chest pain  4.  Sleep apnea      Disposition: Follow-up with Nelva Bush, MD or APP in *** months {Are you ordering a CV Procedure (e.g. stress test, cath, DCCV, TEE, etc)?   Press F2        :[id number]   Medication Adjustments/Labs and Tests Ordered: Current medicines are reviewed at length with the patient today.  Concerns regarding medicines are outlined above.    Signed, Mable Fill, Marissa Nestle, NP 04/29/2022, 4:55 PM [place]

## 2022-04-30 ENCOUNTER — Telehealth: Payer: Medicaid Other | Admitting: Physician Assistant

## 2022-04-30 DIAGNOSIS — R3989 Other symptoms and signs involving the genitourinary system: Secondary | ICD-10-CM

## 2022-04-30 MED ORDER — CEPHALEXIN 500 MG PO CAPS: 500.0000 mg | ORAL_CAPSULE | Freq: Two times a day (BID) | ORAL | 0 refills | Status: AC

## 2022-04-30 NOTE — Progress Notes (Signed)
?Virtual Visit Consent  ? ?Bailey Hurl. [name], you are scheduled for a virtual visit with a [facility] provider today. Just as with appointments in the office, your consent must be obtained to participate. Your consent will be active for this visit and any virtual visit you may have with one of our providers in the next 365 days. If you have a MyChart account, a copy of this consent can be sent to you electronically. ? ?As this is a virtual visit, video technology does not allow for your provider to perform a traditional examination. This may limit your provider's ability to fully assess your condition. If your provider identifies any concerns that need to be evaluated in person or the need to arrange testing (such as labs, EKG, etc.), we will make arrangements to do so. Although advances in technology are sophisticated, we cannot ensure that it will always work on either your end or our end. If the connection with a video visit is poor, the visit may have to be switched to a telephone visit. With either a video or telephone visit, we are not always able to ensure that we have a secure connection. ? ?By engaging in this virtual visit, you consent to the provision of healthcare and authorize for your insurance to be billed (if applicable) for the services provided during this visit. Depending on your insurance coverage, you may receive a charge related to this service. ? ?I need to obtain your verbal consent now. Are you willing to proceed with your visit today? Bailey Hurl. Facchini has provided verbal consent on 04/30/2022 for a virtual visit (video or telephone). Piedad Climes, PA-C ? ?Date: 04/30/2022 5:58 PM ? ?Virtual Visit via Video Note  ? ?I, Piedad Climes, connected with  [name]  ([id number], January 30, 1961) on 04/30/22 at  5:45 PM EDT by a video-enabled telemedicine application and verified that I am speaking with the correct person using two identifiers. ? ?Location: ?Patient: Virtual Visit Location  Patient: Home ?Provider: Virtual Visit Location Provider: Home Office ?  ?I discussed the limitations of evaluation and management by telemedicine and the availability of in person appointments. The patient expressed understanding and agreed to proceed.   ? ?History of Present Illness: ?[name] is a 61 y.o. who identifies as a female who was assigned female at birth, and is being seen today for possible UTI. Endorses symptoms starting last week with some bilateral lower back pain, urinary urgency, frequency, hesitancy and suprapubic pressure. Notes urine has a bad odor to it. Denies any hematuria. Denies fever, chills, nausea or vomiting. Is trying to hydrate well giving her use of multiple diuretics and need to balance fluids due to history of heart failure. Notes urine is a darker yellow. Has history of UTI and this feels similar.  ? ?Lab Results  ?Component Value Date  ? CREATININE 0.69 06/23/2021  ? ? ? ?HPI: HPI  ?Problems:  ?Patient Active Problem List  ? Diagnosis Date Noted  ? Morbid obesity (HCC) 10/04/2021  ? CVA (cerebral vascular accident) (HCC) 06/23/2021  ? Macrocytosis 12/07/2020  ? Erythrocytosis 12/07/2020  ? Depression 11/12/2017  ? Chronic heart failure with preserved ejection fraction (HFpEF) (HCC) 07/22/2017  ? Chest pain of uncertain etiology 07/22/2017  ? Chronic diastolic heart failure (HCC) 11/30/2015  ? Essential hypertension 11/30/2015  ? Obstructive sleep apnea 11/30/2015  ? Lymphedema of both lower extremities 11/30/2015  ? Cellulitis 11/01/2015  ?  ?Allergies: No Known Allergies ?Medications:  ?Current Outpatient  Medications:  ?  aspirin 81 MG chewable tablet, Chew by mouth daily., Disp: , Rfl:  ?  cephALEXin (KEFLEX) 500 MG capsule, Take 1 capsule (500 mg total) by mouth 2 (two) times daily for 7 days., Disp: 14 capsule, Rfl: 0 ?  atenolol (TENORMIN) 50 MG tablet, Take 1 tablet (50 mg total) by mouth 2 (two) times daily., Disp: 60 tablet, Rfl: 5 ?  bismuth subsalicylate (PEPTO  BISMOL) 262 MG/15ML suspension, Take 30 mLs by mouth every 6 (six) hours as needed., Disp: , Rfl:  ?  calcium carbonate (TUMS - DOSED IN MG ELEMENTAL CALCIUM) 500 MG chewable tablet, Chew 1 tablet by mouth daily as needed for indigestion or heartburn., Disp: , Rfl:  ?  Cholecalciferol (VITAMIN D-3) 125 MCG (5000 UT) TABS, Take 1 tablet by mouth daily., Disp: , Rfl:  ?  Cyanocobalamin 1000 MCG CAPS, Take by mouth daily., Disp: , Rfl:  ?  furosemide (LASIX) 80 MG tablet, Take 1 tablet (80 mg total) by mouth daily., Disp: 30 tablet, Rfl: 5 ?  losartan (COZAAR) 100 MG tablet, Take 1 tablet (100 mg total) by mouth daily., Disp: 90 tablet, Rfl: 0 ?  Menthol, Topical Analgesic, (BIOFREEZE) 4 % GEL, Apply topically., Disp: , Rfl:  ?  METOLAZONE PO, Take 2.5 mg by mouth 2 (two) times a week., Disp: , Rfl:  ?  omeprazole (PRILOSEC) 20 MG capsule, Take 1 capsule (20 mg total) by mouth daily., Disp: 90 capsule, Rfl: 0 ?  OXYGEN, Inhale 4 L into the lungs continuous., Disp: , Rfl:  ? ?Observations/Objective: ?Patient is well-developed, well-nourished in no acute distress.  ?Resting comfortably at home.  ?Head is normocephalic, atraumatic.  ?No labored breathing. ?Speech is clear and coherent with logical content.  ?Patient is alert and oriented at baseline.  ? ?Assessment and Plan: ?1. Suspected UTI ?- cephALEXin (KEFLEX) 500 MG capsule; Take 1 capsule (500 mg total) by mouth 2 (two) times daily for 7 days.  Dispense: 14 capsule; Refill: 0 ? ?Classic symptoms without al;arm signs/symptoms but in patient with several chronic medical conditions. Will start treatment empirically with Keflex 500 mg BID x 7 days. Supportive measures and OTC medications reviewed. She was instructed to call her PCP office tomorrow morning when they open to schedule a follow-up. Very strict ER precautions reviewed.  ? ?Follow Up Instructions: ?I discussed the assessment and treatment plan with the patient. The patient was provided an opportunity to ask  questions and all were answered. The patient agreed with the plan and demonstrated an understanding of the instructions.  A copy of instructions were sent to the patient via MyChart unless otherwise noted below.  ? ?The patient was advised to call back or seek an in-person evaluation if the symptoms worsen or if the condition fails to improve as anticipated. ? ?Time:  ?I spent 10 minutes with the patient via telehealth technology discussing the above problems/concerns.   ? ?Piedad Climes, PA-C ?

## 2022-04-30 NOTE — Patient Instructions (Signed)
?Fredrick Dray. Clelia Croft, thank you for joining Piedad Climes, PA-C for today's virtual visit.  While this provider is not your primary care provider (PCP), if your PCP is located in our provider database this encounter information will be shared with them immediately following your visit. ? ?Consent: ?(Patient) [name]. Batie provided verbal consent for this virtual visit at the beginning of the encounter. ? ?Current Medications: ? ?Current Outpatient Medications:  ?  aspirin 81 MG chewable tablet, Chew by mouth daily., Disp: , Rfl:  ?  atenolol (TENORMIN) 50 MG tablet, Take 1 tablet (50 mg total) by mouth 2 (two) times daily., Disp: 60 tablet, Rfl: 5 ?  bismuth subsalicylate (PEPTO BISMOL) 262 MG/15ML suspension, Take 30 mLs by mouth every 6 (six) hours as needed., Disp: , Rfl:  ?  calcium carbonate (TUMS - DOSED IN MG ELEMENTAL CALCIUM) 500 MG chewable tablet, Chew 1 tablet by mouth daily as needed for indigestion or heartburn., Disp: , Rfl:  ?  Cholecalciferol (VITAMIN D-3) 125 MCG (5000 UT) TABS, Take 1 tablet by mouth daily., Disp: , Rfl:  ?  Cyanocobalamin 1000 MCG CAPS, Take by mouth daily., Disp: , Rfl:  ?  furosemide (LASIX) 80 MG tablet, Take 1 tablet (80 mg total) by mouth daily., Disp: 30 tablet, Rfl: 5 ?  losartan (COZAAR) 100 MG tablet, Take 1 tablet (100 mg total) by mouth daily., Disp: 90 tablet, Rfl: 0 ?  Menthol, Topical Analgesic, (BIOFREEZE) 4 % GEL, Apply topically., Disp: , Rfl:  ?  METOLAZONE PO, Take 2.5 mg by mouth 2 (two) times a week., Disp: , Rfl:  ?  omeprazole (PRILOSEC) 20 MG capsule, Take 1 capsule (20 mg total) by mouth daily., Disp: 90 capsule, Rfl: 0 ?  OXYGEN, Inhale 4 L into the lungs continuous., Disp: , Rfl:   ? ?Medications ordered in this encounter:  ?No orders of the defined types were placed in this encounter. ?  ? ?*If you need refills on other medications prior to your next appointment, please contact your pharmacy* ? ?Follow-Up: ?Call back or seek an in-person evaluation if  the symptoms worsen or if the condition fails to improve as anticipated. ? ?Other Instructions ?Your symptoms are consistent with a bladder infection, also called acute cystitis. Please take your antibiotic (Keflex) as directed until all pills are gone.  Stay very well hydrated.  Consider a daily probiotic (Align, Culturelle, or Activia) to help prevent stomach upset caused by the antibiotic.  Taking a probiotic daily may also help prevent recurrent UTIs.  Also consider taking AZO (Phenazopyridine) tablets to help decrease pain with urination. Go ahead and call to schedule a follow-up with your primary care provider. ? ?If you note any fever, vomiting, increased back pain, low urine volume or cola-colored urine -- you must be evaluated in the ER ASAP.  ? ?Urinary Tract Infection ?A urinary tract infection (UTI) can occur any place along the urinary tract. The tract includes the kidneys, ureters, bladder, and urethra. A type of germ called bacteria often causes a UTI. UTIs are often helped with antibiotic medicine.  ?HOME CARE  ?If given, take antibiotics as told by your doctor. Finish them even if you start to feel better. ?Drink enough fluids to keep your pee (urine) clear or pale yellow. ?Avoid tea, drinks with caffeine, and bubbly (carbonated) drinks. ?Pee often. Avoid holding your pee in for a long time. ?Pee before and after having sex (intercourse). ?Wipe from front to back after you poop (bowel movement)  if you are a woman. Use each tissue only once. ?GET HELP RIGHT AWAY IF:  ?You have back pain. ?You have lower belly (abdominal) pain. ?You have chills. ?You feel sick to your stomach (nauseous). ?You throw up (vomit). ?Your burning or discomfort with peeing does not go away. ?You have a fever. ?Your symptoms are not better in 3 days. ?MAKE SURE YOU:  ?Understand these instructions. ?Will watch your condition. ?Will get help right away if you are not doing well or get worse. ?Document Released: 05/26/2008  Document Revised: 09/01/2012 Document Reviewed: 07/08/2012 ?ExitCare? Patient Information ?2015 [employer]. This information is not intended to replace advice given to you by your health care provider. Make sure you discuss any questions you have with your health care provider. ? ? ? ? ?If you have been instructed to have an in-person evaluation today at a local Urgent Care facility, please use the link below. It will take you to a list of all of our available [facility], including address, phone number and hours of operation. Please do not delay care.  ?[facility] ? ?If you or a family member do not have a primary care provider, use the link below to schedule a visit and establish care. When you choose a [facility] primary care physician or advanced practice provider, you gain a long-term partner in health. ?Find a Primary Care Provider ? ?Learn more about [facility]'s in-office and virtual care options: ?[facility] - Get Care Now  ?

## 2022-05-01 ENCOUNTER — Ambulatory Visit: Payer: Medicaid Other | Admitting: Physician Assistant

## 2022-05-01 DIAGNOSIS — I10 Essential (primary) hypertension: Secondary | ICD-10-CM

## 2022-05-01 DIAGNOSIS — I5032 Chronic diastolic (congestive) heart failure: Secondary | ICD-10-CM

## 2022-05-01 DIAGNOSIS — G4733 Obstructive sleep apnea (adult) (pediatric): Secondary | ICD-10-CM

## 2022-05-01 DIAGNOSIS — R079 Chest pain, unspecified: Secondary | ICD-10-CM

## 2022-07-23 ENCOUNTER — Telehealth: Payer: Self-pay | Admitting: Internal Medicine

## 2022-07-23 NOTE — Telephone Encounter (Signed)
Never rescheduled overdue fu

## 2023-04-22 ENCOUNTER — Other Ambulatory Visit: Payer: Self-pay | Admitting: Internal Medicine

## 2023-04-22 DIAGNOSIS — Z1231 Encounter for screening mammogram for malignant neoplasm of breast: Secondary | ICD-10-CM

## 2023-04-22 NOTE — Progress Notes (Signed)
 Chief Complaint:   Chief Complaint  Patient presents with   Annual Exam    Subjective:   [name] is a 62 y.o. female in today for her annual physical exam.  Have not seen the patient in over a year.  Current Outpatient Medications  Medication Sig Dispense Refill   albuterol  (PROAIR  HFA) 90 mcg/actuation inhaler Inhale 2 inhalations into the lungs every 6 (six) hours as needed for Wheezing PATIENT NEEDS APPOINTMENT FOR ADDITIONAL REFILLS 1 Inhaler 0   aspirin  81 MG EC tablet Take 81 mg by mouth once daily     atenoloL  (TENORMIN ) 50 MG tablet TAKE 1 TABLET BY MOUTH TWICE A DAY 180 tablet 1   cholecalciferol (VITAMIN D3) 2,000 unit capsule Take 2,000 Units by mouth once daily     cyanocobalamin  (VITAMIN B12) 1000 MCG tablet Take 1,000 mcg by mouth once daily     diaper,brief,adult,disposable (PREVAIL PROTECTIVE UNDERWEAR) Misc Use 1 each as needed 120 each 5   diclofenac (VOLTAREN) 1 % topical gel Apply 2 g topically 2 (two) times daily as needed     FUROsemide  (LASIX ) 80 MG tablet TAKE 1 TABLET (80 MG TOTAL) BY MOUTH ONCE DAILY. 90 tablet 1   Lactobacillus acidophilus (PROBIOTIC ACIDOPHILUS ORAL) Take 0.5 mg by mouth     losartan  (COZAAR ) 100 MG tablet Take 1 tablet (100 mg total) by mouth once daily for 90 days 30 tablet 1   metOLazone  (ZAROXOLYN ) 2.5 MG tablet Take 1 tablet (2.5 mg total) by mouth once daily 24 tablet 14   valACYclovir (VALTREX) 1000 MG tablet Take 1 tablet (1,000 mg total) by mouth once daily 30 tablet 11   megestroL  (MEGACE ) 40 MG tablet Take by mouth     No current facility-administered medications for this visit.    Allergies as of 04/22/2023   (No Known Allergies)    Past Medical History:  Diagnosis Date   CHF (congestive heart failure) (CMS-HCC)    Depression (emotion)    DJD (degenerative joint disease)    GERD (gastroesophageal reflux disease)    Hypertension    Lymphedema of both lower extremities    Morbid obesity (CMS-HCC)     Sleep apnea    Vitamin D deficiency     No past surgical history on file.   Family History  Problem Relation Name Age of Onset   Seizures Mother     Stroke Mother      Social History:  reports that she quit smoking about 11 years ago. Her smoking use included cigarettes. She started smoking about 41 years ago. She has a 15 pack-year smoking history. She has never used smokeless tobacco. She reports current alcohol  use. Drug use questions deferred to the physician.  Results for orders placed or performed in visit on 03/12/22  ORDER   Narrative   Ordered by an unspecified provider.      ROS:  General: No fever, chills or recent illness. No change in weight Skin:   No skin lesions, growths, masses, rashes, pruritus  HEENT: No change in vision or hearing. No pain or difficulty with swallowing Respiratory: No cough or shortness of breath CV:  No chest pain or palpitations GI:  No pain, dyspepsia or change in bowel habits GU:  No dysuria, frequency, or hesitancy MSK:  No joint pain or injury Neurological: No headaches, changes in mental status, loss of sensation or strength Endocrine:  No heat or cold intolerance, polydipsia, polyuria  PHQ 2/9 from today's flowsheet  PHQ-2 PHQ-2  Over the last 2 weeks, how often have you been bothered by any of the following problems? Little interest or pleasure in doing things: Not at all Feeling down, depressed, or hopeless: Several days Patient Health Questionnaire-2 Score: 1  PHQ-9 (if PHQ >=3)    Depression Severity and Treatment Recommendations:  0-4= None  5-9= Mild / Treatment: Support, educate to call if worse; return in one month  10-14= Moderate / Treatment: Support, watchful waiting; Antidepressant or Psychotherapy  15-19= Moderately severe / Treatment: Antidepressant OR Psychotherapy  >= 20 = Major depression, severe / Antidepressant AND Psychotherapy   Alcohol  Screening: Alcohol  Use: Not At Risk (04/22/2023)    AUDIT-C    Frequency of Alcohol  Consumption: 2-4 times a month    Average Number of Drinks: 1 or 2    Frequency of Binge Drinking: Never   Total time spent on alcohol  screening was approximately 15 minutes.   Objective:   Body mass index is 84.01 kg/m.  BP (!) 140/98   Pulse 56   Wt (!) 201.7 kg (444 lb 9.6 oz)   SpO2 (!) 90%   BMI 84.01 kg/m   General: Obese female, in no acute distress HEENT: Pupils equal and round, EOMI. oral mucosa moist.  Oropharynx clear. Neck: supple, trachea midline; no thyromegaly Respiratory:clear to auscultation.  No dullness to percussion.  No use of accessory muscles. Cardiac:  Regular rate and rhythm without murmur, gallops, or rubs Vascular: Carotid and radials 2+; distal pulses 2+. Musculoskeletal: 1+ lower extremity edema neuro: CN grossly intact.  No acute decrease in sensation in the upper and lower extremities bilaterally. Integumental: Moist with no significant rashes or nodules    Assessment/Plan:   Encounter for routine adult physical exam with abnormal findings  (primary encounter diagnosis) Essential hypertension CHF with cardiomyopathy (CMS/HHS-HCC) Mild intermittent asthma without complication (HHS-HCC) OSA (obstructive sleep apnea) Major depressive disorder, recurrent, in full remission (CMS-HCC) Breast cancer screening by mammogram Colon cancer screening Morbid obesity with body mass index of 70 and over in adult (CMS/HHS-HCC) Chronic respiratory failure with hypoxia (CMS/HHS-HCC)  Assessment and Plan  1.  Annual physical exam.  Ordered Cologuard and mammogram to bring patient up-to-date with health maintenance.  Will get screening labs today to review. 2.  Hypertension.  Continue current medications. 3.  CHF.  No recent exacerbation. 4.  Asthma.  No recent flareup. 5.  Obstructive sleep apnea.  She benefits from CPAP usage. 6.  Depression.  See PHQ 2 scores above. 7.  Chronic respiratory failure.  She is on home  oxygen .  Will renew order. 8.  Morbid obesity.  She would benefit greatly from weight loss.    Goals      * Lose Weight (pt-stated)        NORLEEN ALM ROWER, MD  Portions of this note were created using dictation software and may contain typographical errors.  *Some images could not be shown.

## 2023-06-11 NOTE — Progress Notes (Signed)
 Referring Physician:  Rudolpho Norleen Lenis, MD  Pulmonologist: @APPTPROVNAME @  Establish Care and Shortness of Breath   History of Present Illness: [name] is a 62 y.o. female that presents to clinic for pulm re evaluation. She was once my patient, lost to follow up due to covid. She is morbidly over weight. On cpap rarely. On 02 at night. Occasionally wheezes, use albuterol  prn with control. No cough, chest pain, leg pain, gerd or fever or chills. Hardly get around due to her weight. Advised strongly to get back on cpap. See bariatric specialist.   Current Medications:  Current Outpatient Medications  Medication Sig Dispense Refill   albuterol  (PROAIR  HFA) 90 mcg/actuation inhaler Inhale 2 inhalations into the lungs every 6 (six) hours as needed for Wheezing PATIENT NEEDS APPOINTMENT FOR ADDITIONAL REFILLS 1 Inhaler 0   aspirin  81 MG EC tablet Take 81 mg by mouth once daily     atenoloL  (TENORMIN ) 50 MG tablet TAKE 1 TABLET BY MOUTH TWICE A DAY 180 tablet 1   cholecalciferol (VITAMIN D3) 2,000 unit capsule Take 2,000 Units by mouth once daily     cyanocobalamin  (VITAMIN B12) 1000 MCG tablet Take 1,000 mcg by mouth once daily     diclofenac (VOLTAREN) 1 % topical gel Apply 2 g topically 2 (two) times daily as needed     FUROsemide  (LASIX ) 80 MG tablet TAKE 1 TABLET (80 MG TOTAL) BY MOUTH ONCE DAILY. 90 tablet 1   Lactobacillus acidophilus (PROBIOTIC ACIDOPHILUS ORAL) Take 0.5 mg by mouth     losartan  (COZAAR ) 100 MG tablet Take 1 tablet (100 mg total) by mouth once daily for 90 days 30 tablet 1   metOLazone  (ZAROXOLYN ) 2.5 MG tablet Take 1 tablet (2.5 mg total) by mouth once daily 24 tablet 14   valACYclovir (VALTREX) 1000 MG tablet Take 1 tablet (1,000 mg total) by mouth once daily 30 tablet 11   diaper,brief,adult,disposable (PREVAIL PROTECTIVE UNDERWEAR) Misc Use 1 each as needed (Patient not taking: Reported on 06/11/2023) 120 each 5   megestroL  (MEGACE ) 40 MG tablet  Take by mouth     No current facility-administered medications for this visit.    Problem List:  Patient Active Problem List  Diagnosis   Hyperglycemia   Morbid obesity with body mass index of 70 and over in adult (CMS/HHS-HCC)   Macrocytosis without anemia   Major depressive disorder, recurrent, in full remission (CMS-HCC)   Gastroesophageal reflux disease without esophagitis   Lymphedema   OSA (obstructive sleep apnea)   Essential hypertension   CHF with cardiomyopathy (CMS/HHS-HCC)   Mild intermittent asthma without complication (HHS-HCC)   Chronic respiratory failure with hypoxia (CMS/HHS-HCC)    Allergies:  Patient has no known allergies.  History:  Past Medical History:  Diagnosis Date   Asthma, unspecified asthma severity, unspecified whether complicated, unspecified whether persistent (HHS-HCC)    CHF (congestive heart failure) (CMS/HHS-HCC)    Depression (emotion)    DJD (degenerative joint disease)    GERD (gastroesophageal reflux disease)    Hypertension    Lymphedema of both lower extremities    Morbid obesity (CMS/HHS-HCC)    Sleep apnea    Vitamin D deficiency     No past surgical history on file.  Family History  Problem Relation Name Age of Onset   Seizures Mother Simcha Farrington    Stroke Mother Monaca Wadas    Asthma Mother Carolyn Sylvia        She had it growing up....she no longer has it  High blood pressure (Hypertension) Father Jerrika Ledlow     [facility]:  reports that she quit smoking about 11 years ago. Her smoking use included cigarettes. She started smoking about 41 years ago. She has a 15 pack-year smoking history. She has never used smokeless tobacco. She reports current alcohol  use of about 3.0 standard drinks of alcohol  per week. Drug use questions deferred to the physician.  Review of System: As per above. All systems were reviewed with her in totality. Again, no other cardiopulmonary symptoms. No nausea, vomiting,  diarrhea, blood in stools,dysuria or flank pain. No GERD, dysphagia, choking spells, polyphagia, polydipsia, heat or cold intolerance, rashes, lymph nodes, bleeding, seizures, TIAs, passing out spells, suicidaly ideation, proximal muscle tenderness, joint effusions. No strong history to suggest sleep apnea. No edema or leg pain. No fever or chills  Physical Exam: BP (!) 181/95   Pulse 75   Ht 154.9 cm (5' 1)   Wt (!) 202.8 kg (447 lb)   SpO2 95% Comment: room air  BMI 84.46 kg/m  (!) 202.8 kg (447 lb) 95% General:  NAD. Able to speak in complete sentences without cough or dyspnea, moridly obese HEENT: Normocephalic, nontraumatic. Extraocular movements intact NECK: Supple. No JVD, nodes, thyromegaly, very short neck CV: RRR no murmurs, gallops, rubs PULM: Normal respiratory effort,  without wheezing or crackles ABD: Increase girth, unable to palpate organs EXTREMITIES: No significant edema, cyanosis or Homans'signs SKIN: Fair turgor. No rashes LYMPHATIC: No nodes NEURO: Raise extremities  against gravity PSYCH: Appropriate affect, alert, oriented   Diagnostics: Fvc 1.6 liters ( 72%), fev1 1.2 liters ( 72 %), fef 69%), mild restriction  Impression: SOB (shortness of breath),  Most of this is due to her weight. Ex smoker, ? Stage o copd. Asthma  She does wheeze at times.. Weight loss Prn albuterol  2 puffs q id  To consider bariatric evaluation Pace self Follow up in 18 weeks   Hx of sleep apnea/hx of co2 retention ?  hypoventilation syndrome. No worsening hypersomnolance since last seen. She is not wearing her cpap as she should. Encouraged strongly to wear it nightly.with her oxygen   Will continue cpap/02  Weight loss   Hx of HEpEF. Well compensated ie clinically not in failure Continue cardiology recs

## 2023-06-13 ENCOUNTER — Telehealth: Payer: Medicaid Other | Admitting: Nurse Practitioner

## 2023-06-13 DIAGNOSIS — R399 Unspecified symptoms and signs involving the genitourinary system: Secondary | ICD-10-CM | POA: Diagnosis not present

## 2023-06-13 MED ORDER — CEPHALEXIN 500 MG PO CAPS: 500.0000 mg | ORAL_CAPSULE | Freq: Two times a day (BID) | ORAL | 0 refills | Status: DC

## 2023-06-13 NOTE — Progress Notes (Signed)

## 2023-06-17 NOTE — Progress Notes (Signed)
 REST AND EXERTION OXIMETRY  SPO2 At Rest On Room Air: 95% SPO2 With Exertion On Room Air: 85% SPO2 On Oxygen  2 LPM With Exertion: 95%

## 2023-07-14 ENCOUNTER — Other Ambulatory Visit: Payer: Self-pay | Admitting: Nurse Practitioner

## 2024-02-12 ENCOUNTER — Telehealth: Payer: Medicaid Other | Admitting: Family Medicine

## 2024-02-12 DIAGNOSIS — R399 Unspecified symptoms and signs involving the genitourinary system: Secondary | ICD-10-CM | POA: Diagnosis not present

## 2024-02-12 MED ORDER — CEPHALEXIN 500 MG PO CAPS: 500.0000 mg | ORAL_CAPSULE | Freq: Two times a day (BID) | ORAL | 0 refills | Status: AC

## 2024-02-12 NOTE — Progress Notes (Signed)

## 2024-03-23 NOTE — Progress Notes (Signed)
 [facility] [employer] was not able to reach the parent by phone call.  The details of interventions are as follows:  Preventive Care-     03/23/2024   12:21 PM  Gap Closure  Age Group: Pediatric  Pediatric Yearly Physical Interventions Unable to reach-- reminder message sent  Comments: lvm    [name]  For more information on [facility] services, click here.

## 2024-11-10 NOTE — Progress Notes (Signed)
 [facility] [employer] was not able to reach the patient by phone call.  The details of interventions are as follows:     11/10/2024    8:53 AM  Gap Closure  Age Group: Adult  Adult Yearly Physical Interventions Unable to reach-- reminder message sent  Comments: Left voice message, MyChart message sent.    COURTNEY SUGGS    [address]; [place], [place] [address] l  [email or web address] l [phone]   For more information on [facility] services, click here.

## 2024-12-08 ENCOUNTER — Emergency Department

## 2024-12-08 ENCOUNTER — Observation Stay

## 2024-12-08 ENCOUNTER — Other Ambulatory Visit: Payer: Self-pay

## 2024-12-08 ENCOUNTER — Inpatient Hospital Stay: Admission: EM | Admit: 2024-12-08 | Discharge: 2024-12-16 | DRG: 291 | Disposition: A | Discharge status: Home Health

## 2024-12-08 DIAGNOSIS — Z9981 Dependence on supplemental oxygen: Secondary | ICD-10-CM

## 2024-12-08 DIAGNOSIS — H814 Vertigo of central origin: Secondary | ICD-10-CM

## 2024-12-08 DIAGNOSIS — R0602 Shortness of breath: Principal | ICD-10-CM

## 2024-12-08 DIAGNOSIS — Z823 Family history of stroke: Secondary | ICD-10-CM | POA: Diagnosis not present

## 2024-12-08 DIAGNOSIS — Z87891 Personal history of nicotine dependence: Secondary | ICD-10-CM

## 2024-12-08 DIAGNOSIS — I959 Hypotension, unspecified: Secondary | ICD-10-CM | POA: Diagnosis not present

## 2024-12-08 DIAGNOSIS — E785 Hyperlipidemia, unspecified: Secondary | ICD-10-CM | POA: Diagnosis present

## 2024-12-08 DIAGNOSIS — I5033 Acute on chronic diastolic (congestive) heart failure: Secondary | ICD-10-CM | POA: Diagnosis present

## 2024-12-08 DIAGNOSIS — I5032 Chronic diastolic (congestive) heart failure: Secondary | ICD-10-CM | POA: Diagnosis present

## 2024-12-08 DIAGNOSIS — M79605 Pain in left leg: Secondary | ICD-10-CM | POA: Diagnosis present

## 2024-12-08 DIAGNOSIS — I509 Heart failure, unspecified: Secondary | ICD-10-CM

## 2024-12-08 DIAGNOSIS — Z8673 Personal history of transient ischemic attack (TIA), and cerebral infarction without residual deficits: Secondary | ICD-10-CM

## 2024-12-08 DIAGNOSIS — I110 Hypertensive heart disease with heart failure: Principal | ICD-10-CM | POA: Diagnosis present

## 2024-12-08 DIAGNOSIS — Z23 Encounter for immunization: Secondary | ICD-10-CM | POA: Diagnosis not present

## 2024-12-08 DIAGNOSIS — K219 Gastro-esophageal reflux disease without esophagitis: Secondary | ICD-10-CM | POA: Diagnosis not present

## 2024-12-08 DIAGNOSIS — Z8419 Family history of other disorders of kidney and ureter: Secondary | ICD-10-CM

## 2024-12-08 DIAGNOSIS — B9620 Unspecified Escherichia coli [E. coli] as the cause of diseases classified elsewhere: Secondary | ICD-10-CM | POA: Diagnosis not present

## 2024-12-08 DIAGNOSIS — N179 Acute kidney failure, unspecified: Secondary | ICD-10-CM | POA: Diagnosis not present

## 2024-12-08 DIAGNOSIS — Z6841 Body Mass Index (BMI) 40.0 and over, adult: Secondary | ICD-10-CM | POA: Diagnosis not present

## 2024-12-08 DIAGNOSIS — Z8249 Family history of ischemic heart disease and other diseases of the circulatory system: Secondary | ICD-10-CM | POA: Diagnosis not present

## 2024-12-08 DIAGNOSIS — Z809 Family history of malignant neoplasm, unspecified: Secondary | ICD-10-CM

## 2024-12-08 DIAGNOSIS — R001 Bradycardia, unspecified: Secondary | ICD-10-CM | POA: Diagnosis present

## 2024-12-08 DIAGNOSIS — J45909 Unspecified asthma, uncomplicated: Secondary | ICD-10-CM | POA: Diagnosis present

## 2024-12-08 DIAGNOSIS — Z7982 Long term (current) use of aspirin: Secondary | ICD-10-CM

## 2024-12-08 DIAGNOSIS — Z1152 Encounter for screening for COVID-19: Secondary | ICD-10-CM

## 2024-12-08 DIAGNOSIS — Z79899 Other long term (current) drug therapy: Secondary | ICD-10-CM

## 2024-12-08 DIAGNOSIS — G4733 Obstructive sleep apnea (adult) (pediatric): Secondary | ICD-10-CM | POA: Diagnosis not present

## 2024-12-08 DIAGNOSIS — N390 Urinary tract infection, site not specified: Secondary | ICD-10-CM | POA: Diagnosis not present

## 2024-12-08 DIAGNOSIS — Z820 Family history of epilepsy and other diseases of the nervous system: Secondary | ICD-10-CM

## 2024-12-08 DIAGNOSIS — J9611 Chronic respiratory failure with hypoxia: Secondary | ICD-10-CM | POA: Diagnosis present

## 2024-12-08 DIAGNOSIS — R42 Dizziness and giddiness: Secondary | ICD-10-CM

## 2024-12-08 DIAGNOSIS — I10 Essential (primary) hypertension: Secondary | ICD-10-CM | POA: Diagnosis present

## 2024-12-08 LAB — COMPREHENSIVE METABOLIC PANEL WITH GFR
ALT: 17 U/L (ref 0–44)
AST: 19 U/L (ref 15–41)
Albumin: 3.7 g/dL (ref 3.5–5.0)
Alkaline Phosphatase: 78 U/L (ref 38–126)
Anion gap: 8 (ref 5–15)
BUN: 12 mg/dL (ref 8–23)
CO2: 31 mmol/L (ref 22–32)
Calcium: 9.3 mg/dL (ref 8.9–10.3)
Chloride: 101 mmol/L (ref 98–111)
Creatinine, Ser: 0.58 mg/dL (ref 0.44–1.00)
GFR, Estimated: >60 mL/min (ref >60)
Glucose, Bld: 113 mg/dL — ABNORMAL HIGH (ref 70–99)
Potassium: 4 mmol/L (ref 3.5–5.1)
Sodium: 141 mmol/L (ref 135–145)
Total Bilirubin: 0.3 mg/dL (ref 0.0–1.2)
Total Protein: 6.9 g/dL (ref 6.5–8.1)

## 2024-12-08 LAB — CBC WITH DIFFERENTIAL/PLATELET
Abs Immature Granulocytes: 0.01 K/uL (ref 0.00–0.07)
Basophils Absolute: 0 K/uL (ref 0.0–0.1)
Basophils Relative: 0 %
Eosinophils Absolute: 0.2 K/uL (ref 0.0–0.5)
Eosinophils Relative: 3 %
HCT: 39 % (ref 36.0–46.0)
Hemoglobin: 12.5 g/dL (ref 12.0–15.0)
Immature Granulocytes: 0 %
Lymphocytes Relative: 49 %
Lymphs Abs: 3.8 K/uL (ref 0.7–4.0)
MCH: 31.7 pg (ref 26.0–34.0)
MCHC: 32.1 g/dL (ref 30.0–36.0)
MCV: 99 fL (ref 80.0–100.0)
Monocytes Absolute: 0.6 K/uL (ref 0.1–1.0)
Monocytes Relative: 8 %
Neutro Abs: 3.2 K/uL (ref 1.7–7.7)
Neutrophils Relative %: 40 %
Platelets: 225 K/uL (ref 150–400)
RBC: 3.94 MIL/uL (ref 3.87–5.11)
RDW: 12 % (ref 11.5–15.5)
WBC: 7.9 K/uL (ref 4.0–10.5)
nRBC: 0 % (ref 0.0–0.2)

## 2024-12-08 LAB — RESP PANEL BY RT-PCR (RSV, FLU A&B, COVID)  RVPGX2
Influenza A by PCR: NEGATIVE
Influenza B by PCR: NEGATIVE
Resp Syncytial Virus by PCR: NEGATIVE
SARS Coronavirus 2 by RT PCR: NEGATIVE

## 2024-12-08 LAB — PRO BRAIN NATRIURETIC PEPTIDE: Pro Brain Natriuretic Peptide: 214 pg/mL (ref <300.0)

## 2024-12-08 LAB — MAGNESIUM: Magnesium: 1.8 mg/dL (ref 1.7–2.4)

## 2024-12-08 LAB — TROPONIN T, HIGH SENSITIVITY: Troponin T High Sensitivity: <15 ng/L (ref 0–19)

## 2024-12-08 MED ORDER — IOHEXOL 350 MG/ML SOLN
75.0000 mL | Freq: Once | INTRAVENOUS | Status: AC | PRN
  Administered 2024-12-08: 20:00:00 75 mL via INTRAVENOUS

## 2024-12-08 MED ORDER — HEPARIN SODIUM (PORCINE) 5000 UNIT/ML IJ SOLN
5000.0000 [IU] | Freq: Three times a day (TID) | INTRAMUSCULAR | Status: DC
  Administered 2024-12-08 – 2024-12-16 (×24): 5000 [IU] via SUBCUTANEOUS
  Filled 2024-12-08 (×24): qty 1

## 2024-12-08 MED ORDER — ONDANSETRON HCL 4 MG PO TABS: 4.0000 mg | ORAL_TABLET | Freq: Four times a day (QID) | ORAL | Status: DC | PRN

## 2024-12-08 MED ORDER — SENNOSIDES-DOCUSATE SODIUM 8.6-50 MG PO TABS: 1.0000 | ORAL_TABLET | Freq: Every evening | ORAL | Status: DC | PRN

## 2024-12-08 MED ORDER — ONDANSETRON HCL 4 MG/2ML IJ SOLN: 4.0000 mg | Freq: Four times a day (QID) | INTRAMUSCULAR | Status: DC | PRN

## 2024-12-08 MED ORDER — ACETAMINOPHEN 650 MG RE SUPP: 650.0000 mg | Freq: Four times a day (QID) | RECTAL | Status: DC | PRN

## 2024-12-08 MED ORDER — ACETAMINOPHEN 325 MG PO TABS
650.0000 mg | ORAL_TABLET | Freq: Four times a day (QID) | ORAL | Status: DC | PRN
  Administered 2024-12-09 – 2024-12-13 (×4): 650 mg via ORAL
  Filled 2024-12-08 (×4): qty 2

## 2024-12-08 MED ORDER — FUROSEMIDE 10 MG/ML IJ SOLN
80.0000 mg | Freq: Once | INTRAMUSCULAR | Status: AC
  Administered 2024-12-08: 15:00:00 80 mg via INTRAVENOUS
  Filled 2024-12-08: qty 8

## 2024-12-08 MED ORDER — IPRATROPIUM-ALBUTEROL 0.5-2.5 (3) MG/3ML IN SOLN
3.0000 mL | Freq: Four times a day (QID) | RESPIRATORY_TRACT | Status: DC | PRN
  Administered 2024-12-11: 3 mL via RESPIRATORY_TRACT
  Filled 2024-12-08: qty 3

## 2024-12-08 MED ORDER — SODIUM CHLORIDE 0.9% FLUSH
3.0000 mL | Freq: Two times a day (BID) | INTRAVENOUS | Status: DC
  Administered 2024-12-08 – 2024-12-11 (×7): 3 mL via INTRAVENOUS
  Administered 2024-12-12: 10 mL via INTRAVENOUS
  Administered 2024-12-12 – 2024-12-13 (×3): 3 mL via INTRAVENOUS
  Administered 2024-12-14: 10 mL via INTRAVENOUS
  Administered 2024-12-14 – 2024-12-16 (×4): 3 mL via INTRAVENOUS

## 2024-12-08 MED ADMIN — Meclizine HCl Tab 25 MG: 50 mg | ORAL | @ 21:00:00 | NDC 00904737661

## 2024-12-08 MED FILL — Meclizine HCl Tab 25 MG: 50.0000 mg | ORAL | Qty: 2 | Status: AC

## 2024-12-08 NOTE — H&P (Addendum)
 History and Physical    Briselda Naval. Seelye FMW:[id number] DOB: 10/11/1961 DOA: 12/08/2024  DOS: the patient was seen and examined on 12/08/2024  PCP: [name], MD   Patient coming from: Home  I have personally briefly reviewed patient's [facility]  HPI:   Kindle Strohmeier. Farnam is a 63 y.o. year old female with medical history of hypertension, hyperlipidemia, HFpEF (EF 60%, G2 DD and 06/2021), OSA and chronic hypoxic respiratory failure on 2 L secondary to above, presenting to the ED with shortness of breath.  Patient reports waking up this morning with significant shortness of breath.  She reports this was not present when she went to sleep last night.  She states she also had dizziness that started after waking up. She states it has been constant without any periods without it. States this is similar to previous episode but more severe. Denies any URI symptoms in the last month.  Denies any fevers or chills.  On arrival to the ED patient was noted to be HDS stable.  Lab work and imaging obtained.  CBC and CMP is unremarkable.  Respiratory panel was negative for COVID, flu, RSV.  proBNP is 214.  Troponin is less than 15.  Chest x-ray shows signs of volume overload with pulmonary vascular congestion.  It is overall poor study given the rotation.  Given patient presentation, [facility] contacted for admission.  Review of Systems: As mentioned in the history of present illness. All other systems reviewed and are negative.   Past Medical History:  Diagnosis Date   Asthma 01/25/2018   CHF (congestive heart failure) (HCC)    Depression    GERD (gastroesophageal reflux disease)    Hypertension    Lymphedema    Morbid obesity (HCC)    Sleep apnea     No past surgical history on file.   Allergies[1]  Family History  Problem Relation Age of Onset   CVA Father    Hypertension Father    Seizures Father    Hypertension Mother    Kidney disease  Mother    Cancer Paternal Grandmother     Prior to Admission medications  Medication Sig Start Date End Date Taking? Authorizing Provider  aspirin  81 MG chewable tablet Chew by mouth daily.    [provider]  atenolol  (TENORMIN ) 50 MG tablet Take 1 tablet (50 mg total) by mouth 2 (two) times daily. 02/13/17   Donette Ellouise LABOR, FNP  bismuth  subsalicylate (PEPTO BISMOL) 262 MG/15ML suspension Take 30 mLs by mouth every 6 (six) hours as needed.    [provider]  calcium  carbonate (TUMS - DOSED IN MG ELEMENTAL CALCIUM ) 500 MG chewable tablet Chew 1 tablet by mouth daily as needed for indigestion or heartburn.    [provider]  Cholecalciferol (VITAMIN D-3) 125 MCG (5000 UT) TABS Take 1 tablet by mouth daily.    [provider]  Cyanocobalamin  1000 MCG CAPS Take by mouth daily.    [provider]  furosemide  (LASIX ) 80 MG tablet Take 1 tablet (80 mg total) by mouth daily. 02/13/17   Donette Ellouise LABOR, FNP  losartan  (COZAAR ) 100 MG tablet Take 1 tablet (100 mg total) by mouth daily. 04/10/22 07/09/22  End, Lonni, MD  Menthol, Topical Analgesic, (BIOFREEZE) 4 % GEL Apply topically.    [provider]  METOLAZONE  PO Take 2.5 mg by mouth 2 (two) times a week.    [provider]  omeprazole  (PRILOSEC )  20 MG capsule Take 1 capsule (20 mg total) by mouth daily. 04/10/22   End, Lonni, MD  OXYGEN  Inhale 4 L into the lungs continuous.    [provider]  albuterol  (PROVENTIL  HFA;VENTOLIN  HFA) 108 (90 Base) MCG/ACT inhaler Inhale 2 puffs into the lungs every 6 (six) hours as needed for wheezing or shortness of breath. Patient not taking: Reported on 06/24/2021  06/24/21  [provider]    Social History:  reports that she quit smoking about 11 years ago. Her smoking use included cigarettes. She started smoking about 21 years ago. She has never used smokeless tobacco. She reports that she does not currently use alcohol  after  a past usage of about 1.0 standard drink of alcohol  per week. She reports that she does not use drugs.    Physical Exam: Vitals:   12/08/24 1142 12/08/24 1144 12/08/24 1215 12/08/24 1800  BP:  (!) 157/89    Pulse:  86    Resp:  19    Temp: 97.8 F (36.6 C)   97.7 F (36.5 C)  TempSrc: Oral     SpO2:  98% 98%   Weight:      Height:       Gen: NAD HENT: NCAT CV: normal heart sounds Lung: CTAB, diminished at lung sounds at the bases Abd: No TTP, normal bowel sounds MSK: No asymmetry, good bulk and tone Neuro: alert and oriented. CNII-XII intact, 5/5 upper and lower extremity strength. Intact sensation. HINTS exam with rotational nystagmus but negative head impulse test and negative test of skew.    Labs on Admission: I have personally reviewed following labs and imaging studies  CBC: Recent Labs  Lab 12/08/24 1152  WBC 7.9  NEUTROABS 3.2  HGB 12.5  HCT 39.0  MCV 99.0  PLT 225   Basic Metabolic Panel: Recent Labs  Lab 12/08/24 1409  NA 141  K 4.0  CL 101  CO2 31  GLUCOSE 113*  BUN 12  CREATININE 0.58  CALCIUM  9.3  MG 1.8   GFR: Estimated Creatinine Clearance: 123.3 mL/min (by C-G formula based on SCr of 0.58 mg/dL). Liver Function Tests: Recent Labs  Lab 12/08/24 1409  AST 19  ALT 17  ALKPHOS 78  BILITOT 0.3  PROT 6.9  ALBUMIN 3.7   No results for input(s): LIPASE, AMYLASE in the last 168 hours. No results for input(s): AMMONIA in the last 168 hours. Coagulation Profile: No results for input(s): INR, PROTIME in the last 168 hours. Cardiac Enzymes: No results for input(s): CKTOTAL, CKMB, CKMBINDEX, TROPONINI, TROPONINIHS in the last 168 hours. BNP (last 3 results) No results for input(s): BNP in the last 8760 hours. HbA1C: No results for input(s): HGBA1C in the last 72 hours. CBG: No results for input(s): GLUCAP in the last 168 hours. Lipid Profile: No results for input(s): CHOL, HDL, LDLCALC, TRIG,  CHOLHDL, LDLDIRECT in the last 72 hours. Thyroid  Function Tests: No results for input(s): TSH, T4TOTAL, FREET4, T3FREE, THYROIDAB in the last 72 hours. Anemia Panel: No results for input(s): VITAMINB12, FOLATE, FERRITIN, TIBC, IRON, RETICCTPCT in the last 72 hours. Urine analysis:    Component Value Date/Time   COLORURINE AMBER (A) 06/23/2021 2210   APPEARANCEUR CLOUDY (A) 06/23/2021 2210   APPEARANCEUR Cloudy 02/12/2013 1250   LABSPEC 1.024 06/23/2021 2210   LABSPEC 1.016 02/12/2013 1250   PHURINE 7.0 06/23/2021 2210   GLUCOSEU NEGATIVE 06/23/2021 2210   GLUCOSEU Negative 02/12/2013 1250   HGBUR NEGATIVE 06/23/2021 2210   BILIRUBINUR NEGATIVE  06/23/2021 2210   BILIRUBINUR Negative 02/12/2013 1250   KETONESUR 5 (A) 06/23/2021 2210   PROTEINUR 30 (A) 06/23/2021 2210   NITRITE POSITIVE (A) 06/23/2021 2210   LEUKOCYTESUR TRACE (A) 06/23/2021 2210   LEUKOCYTESUR 2+ 02/12/2013 1250    Radiological Exams on Admission: I have personally reviewed images DG Chest Port 1 View Result Date: 12/08/2024 CLINICAL DATA:  10026 Shortness of breath 10026 EXAM: PORTABLE CHEST - 1 VIEW COMPARISON:  11/01/2015 FINDINGS: Central pulmonary vascular congestion. No focal airspace consolidation, pleural effusion, or pneumothorax. Moderate cardiomegaly. Tortuous aorta with aortic atherosclerosis. No acute fracture or destructive lesions. Multilevel thoracic osteophytosis. IMPRESSION: Moderate cardiomegaly with central pulmonary vascular congestion. No overt pulmonary edema. Electronically Signed   By: Rogelia Myers M.D.   On: 12/08/2024 12:17    EKG: My personal interpretation of EKG shows: Sinus rhythm without any acute ST changes  Assessment/Plan Principal Problem:   Vertigo Active Problems:   Essential hypertension   Obstructive sleep apnea   Chronic heart failure with preserved ejection fraction (HFpEF) (HCC)   Obesity, morbid, BMI 50 or higher (HCC)   Acute  exacerbation of CHF (congestive heart failure) (HCC)   Acute Heart Failure Exacerbation Pt with hx of CHF and signs of volume overload on exam and imaging suggestive of CHF exacerbation. Suspect her BNP is normal given her BMI.  She is supposed to be on Lasix  and metolazone  but appears she is not taking these.  Given her last echocardiogram was in 2022.  Will repeat echocardiogram this admission. - Admit to med-telemetry - Start IV Furosemide  40 mg BID  - Trend BMP, follow mag (goal K>4 and Mag>2) - Strict I&Os - Daily Weights - Fluid restriction to 2 L, limit salt intake. Educate on importance of both.  - Keep O2 sat >88  Vertigo: concern for central etiology. I have ordered head imaging. I will also order meclizine  to see if she has symptomatic improvement but given rotational nystagmus, I am concerned for central etiology and would pursue imaging to rule out stroke. Given her symptoms are limited to vertigo and it has been >6 hours, she is not a candidate for acute interventions.   Hypertension: Patient's home regimen appears to be losartan  100 mg daily.  Allowing permissive hypertension for now.   Hyperlipidemia: Not on statin.  Patient has previous episode that was consistent with CVA as determined by neurology.  Given this, she may need to be on statin with LDL goal less than 70.  Severe obesity: BMI is 80.  She will benefit from therapies for weight loss.  These needs to be considered on outpatient basis.  Did discuss lifestyle modifications and overall benefits of weight loss on her health.  OSA: Will place on nightly CPAP.  This can precipitate heart failure exacerbation and promote weight gain.   VTE prophylaxis:  SQ Heparin   Diet: Heart healthy Code Status:  Full Code Telemetry:  Admission status: Observation, Telemetry bed Patient is from: Home Anticipated d/c is to: Home Anticipated d/c is in: 1-2 days   Family Communication: Updated at bedside  Consults called:  None   Severity of Illness: The appropriate patient status for this patient is OBSERVATION. Observation status is judged to be reasonable and necessary in order to provide the required intensity of service to ensure the patient's safety. The patient's presenting symptoms, physical exam findings, and initial radiographic and laboratory data in the context of their medical condition is felt to place them at decreased risk for further  clinical deterioration. Furthermore, it is anticipated that the patient will be medically stable for discharge from the hospital within 2 midnights of admission.    Morene Bathe, MD Jolynn DEL. [facility]      [1] No Known Allergies

## 2024-12-08 NOTE — ED Provider Notes (Signed)
 Angiocath insertion Performed by: Franky Moores  Consent: Verbal consent obtained. Risks and benefits: risks, benefits and alternatives were discussed Time out: Immediately prior to procedure a time out was called to verify the correct patient, procedure, equipment, support staff and site/side marked as required.  Preparation: Patient was prepped and draped in the usual sterile fashion.  Vein Location: Left forearm  Yes: Ultrasound Guided  Gauge: 20  Normal blood return and flush without difficulty Patient tolerance: Patient tolerated the procedure well with no immediate complications.     Moores Franky, MD 12/08/24 [phone]

## 2024-12-08 NOTE — ED Provider Notes (Signed)
 [facility] Provider Note    Event Date/Time   First MD Initiated Contact with Huerta 12/08/24 1135     (approximate)   History   Shortness of Breath   HPI  [name]. Anstey is a 63 y.o. female with a history of hypertension, CHF, chronic respiratory failure on 3 L O2 at home, and GERD who presents with shortness of breath, acute onset when she woke up this morning, associated with feeling very woozy and lightheaded.  [name] denies associated chest pain.  She has no new or worsening leg swelling.  She denies any change to her chronic cough.  She has no fever or chills.  She felt weak enough that she was unable to get up or walk.  I reviewed Bailey past medical records.  [name]'s most recent outpatient visit that I have access to was with family medicine on 2/21 for a UTI.   Physical Exam   Triage Vital Signs: ED Triage Vitals [12/08/24 1140]  Encounter Vitals Group     BP      Girls Systolic BP Percentile      Girls Diastolic BP Percentile      Boys Systolic BP Percentile      Boys Diastolic BP Percentile      Pulse      Resp      Temp      Temp src      SpO2      Weight (!) 440 lb (199.6 kg)     Height 5' 2 (1.575 m)     Head Circumference      Peak Flow      Pain Score      Pain Loc      Pain Education      Exclude from Growth Chart     Most recent vital signs: Vitals:   12/08/24 1144 12/08/24 1215  BP: (!) 157/89   Pulse: 86   Resp: 19   Temp:    SpO2: 98% 98%     General: Alert, weak appearing, no distress.  CV:  Good peripheral perfusion.  Resp:  Increased respiratory effort.  Diminished breath sounds bilaterally. Abd:  No distention.  Other:  No significant pitting edema.   ED Results / Procedures / Treatments   Labs (all labs ordered are listed, but only abnormal results are displayed) Labs Reviewed  COMPREHENSIVE METABOLIC PANEL WITH GFR - Abnormal; Notable for Bailey following components:      Result Value    Glucose, Bld 113 (*)    All other components within normal limits  RESP PANEL BY RT-PCR (RSV, FLU A&B, COVID)  RVPGX2  CBC WITH DIFFERENTIAL/PLATELET  PRO BRAIN NATRIURETIC PEPTIDE  TROPONIN T, HIGH SENSITIVITY  TROPONIN T, HIGH SENSITIVITY     EKG  ED ECG REPORT I, Waylon Cassis, Bailey attending physician, personally viewed and interpreted this ECG.  Date: 12/08/2024 EKG Time: 1145 Rate: 83 Rhythm: normal sinus rhythm QRS Axis: normal Intervals: normal ST/T Wave abnormalities: Nonspecific T wave abnormalities Narrative Interpretation: no evidence of acute ischemia    RADIOLOGY  Chest x-ray: I independently viewed and interpreted Bailey images; there is cardiomegaly with no focal consolidation or significant edema  PROCEDURES:  Critical Care performed: No  Procedures   MEDICATIONS ORDERED IN ED: Medications  furosemide  (LASIX ) injection 80 mg (80 mg Intravenous Given 12/08/24 1508)     IMPRESSION / MDM / ASSESSMENT AND PLAN / ED COURSE  I reviewed Bailey triage vital  signs and Bailey nursing notes.  63 year old female with PMH as noted above presents with acute onset of shortness of breath since this morning associated with dizziness and generalized weakness.  On exam [name] is weak appearing but in no acute distress.  She does have somewhat increased work of breathing.  O2 saturation is in Bailey high 90s on her baseline 3 L O2.  She has diminished breath sounds bilaterally.  Differential diagnosis includes, but is not limited to, CHF exacerbation, acute bronchitis, COVID or other viral syndrome, pneumonia, less likely other cardiac etiology.  We will obtain a chest x-ray, lab workup, and reassess.  Huerta's presentation is most consistent with acute presentation with potential threat to life or bodily function.  [name] is on Bailey cardiac monitor to evaluate for evidence of arrhythmia and/or significant heart rate  changes.  ----------------------------------------- 3:47 PM on 12/08/2024 -----------------------------------------  Lab workup is overall reassuring.  Troponin and BNP are negative.  Respiratory panel is negative.  CMP and CBC show no acute findings.  However Bailey chest x-ray does show some vascular congestion.  Overall presentation is most concerning for possible CHF.  I have ordered IV Lasix .  Given [name]'s weakness and decreased exercise tolerance, she will need admission for further monitoring.  I consulted Bailey hospitalist; based on our discussion he agrees to evaluate Huerta for admission.   FINAL CLINICAL IMPRESSION(S) / ED DIAGNOSES   Final diagnoses:  Shortness of breath     Rx / DC Orders   ED Discharge Orders     None        Note:  This document was prepared using Dragon voice recognition software and may include unintentional dictation errors.    Jacolyn Pae, MD 12/08/24 1547

## 2024-12-08 NOTE — ED Triage Notes (Signed)
 Pt BIB EMS from home with complaints of SOB and dizziness that started this morning. Per Ems pt is on 3L at baseline. Pt has hx of HTN and CHF.

## 2024-12-08 NOTE — ED Notes (Signed)
 Lab called about pt needing redraw of light green. Pt is a very hard stick. Current IV will not draw. Lab called back at this time by this RN to attempt redraw.

## 2024-12-09 ENCOUNTER — Observation Stay: Admit: 2024-12-09 | Discharge: 2024-12-09 | Disposition: A | Attending: Internal Medicine

## 2024-12-09 DIAGNOSIS — R42 Dizziness and giddiness: Secondary | ICD-10-CM | POA: Diagnosis not present

## 2024-12-09 DIAGNOSIS — I5031 Acute diastolic (congestive) heart failure: Secondary | ICD-10-CM | POA: Diagnosis not present

## 2024-12-09 LAB — CBC
HCT: 42.7 % (ref 36.0–46.0)
Hemoglobin: 13.2 g/dL (ref 12.0–15.0)
MCH: 31.4 pg (ref 26.0–34.0)
MCHC: 30.9 g/dL (ref 30.0–36.0)
MCV: 101.7 fL — ABNORMAL HIGH (ref 80.0–100.0)
Platelets: 213 K/uL (ref 150–400)
RBC: 4.2 MIL/uL (ref 3.87–5.11)
RDW: 12.3 % (ref 11.5–15.5)
WBC: 6.5 K/uL (ref 4.0–10.5)
nRBC: 0 % (ref 0.0–0.2)

## 2024-12-09 LAB — BASIC METABOLIC PANEL WITH GFR
Anion gap: 8 (ref 5–15)
BUN: 11 mg/dL (ref 8–23)
CO2: 33 mmol/L — ABNORMAL HIGH (ref 22–32)
Calcium: 9.4 mg/dL (ref 8.9–10.3)
Chloride: 99 mmol/L (ref 98–111)
Creatinine, Ser: 0.68 mg/dL (ref 0.44–1.00)
GFR, Estimated: >60 mL/min
Glucose, Bld: 89 mg/dL (ref 70–99)
Potassium: 3.9 mmol/L (ref 3.5–5.1)
Sodium: 139 mmol/L (ref 135–145)

## 2024-12-09 LAB — BLOOD GAS, VENOUS
Acid-Base Excess: 13 mmol/L — ABNORMAL HIGH (ref 0.0–2.0)
Bicarbonate: 40.6 mmol/L — ABNORMAL HIGH (ref 20.0–28.0)
O2 Saturation: 64.3 %
Patient temperature: 37
pCO2, Ven: 64 mmHg — ABNORMAL HIGH (ref 44–60)
pH, Ven: 7.41 (ref 7.25–7.43)
pO2, Ven: 37 mmHg (ref 32–45)

## 2024-12-09 LAB — TROPONIN T, HIGH SENSITIVITY: Troponin T High Sensitivity: <15 ng/L (ref 0–19)

## 2024-12-09 MED ORDER — INFLUENZA VIRUS VACC SPLIT PF (FLUZONE) 0.5 ML IM SUSY
0.5000 mL | PREFILLED_SYRINGE | INTRAMUSCULAR | Status: AC
  Administered 2024-12-10: 0.5 mL via INTRAMUSCULAR
  Filled 2024-12-09: qty 0.5

## 2024-12-09 MED ORDER — HYDRALAZINE HCL 20 MG/ML IJ SOLN
5.0000 mg | Freq: Once | INTRAMUSCULAR | Status: AC
  Administered 2024-12-09: 5 mg via INTRAVENOUS
  Filled 2024-12-09: qty 1

## 2024-12-09 MED ORDER — FUROSEMIDE 10 MG/ML IJ SOLN
40.0000 mg | Freq: Two times a day (BID) | INTRAMUSCULAR | Status: DC
  Administered 2024-12-09 – 2024-12-12 (×7): 40 mg via INTRAVENOUS
  Filled 2024-12-09 (×7): qty 4

## 2024-12-09 MED ORDER — PERFLUTREN LIPID MICROSPHERE
1.0000 mL | INTRAVENOUS | Status: AC | PRN
  Administered 2024-12-09: 2 mL via INTRAVENOUS

## 2024-12-09 MED ORDER — LOSARTAN POTASSIUM 50 MG PO TABS
100.0000 mg | ORAL_TABLET | Freq: Every day | ORAL | Status: DC
  Administered 2024-12-09 – 2024-12-12 (×4): 100 mg via ORAL
  Filled 2024-12-09 (×4): qty 2

## 2024-12-09 MED ORDER — MECLIZINE HCL 25 MG PO TABS
25.0000 mg | ORAL_TABLET | Freq: Three times a day (TID) | ORAL | Status: DC | PRN
  Administered 2024-12-09: 25 mg via ORAL
  Filled 2024-12-09: qty 1

## 2024-12-09 MED ORDER — LABETALOL HCL 5 MG/ML IV SOLN: 10.0000 mg | INTRAVENOUS | Status: DC | PRN

## 2024-12-09 NOTE — TOC Initial Note (Addendum)
 Transition of Care (TOC) - Initial/Assessment Note    Patient Details  Name: [name] MRN: [id number] Date of Birth: 09-Nov-1961  Transition of Care [facility]) CM/SW Contact:    Racheal LITTIE Schimke, RN Phone Number: 12/09/2024, 2:39 PM  Clinical Narrative:  CMRN spoke with patient at bedside in ED38. She is alert and oriented x4, lives alone in [place] lower level. DMF, BSC, RW, O2 and 3 in 1. Patient voices need for PT/OT, had PCS services but lost coverage. HHPT/OT recs made, CM arranged HHPT/OT/ RN with Hedda Lloyd. Per attending patient will not discharge today, Lloyd notified.                       Patient Goals and CMS Choice            Expected Discharge Plan and Services                                              Prior Living Arrangements/Services                       Activities of Daily Living      Permission Sought/Granted                  Emotional Assessment              Admission diagnosis:  Acute exacerbation of CHF (congestive heart failure) (HCC) [I50.9] Patient Active Problem List   Diagnosis Date Noted   Acute exacerbation of CHF (congestive heart failure) (HCC) 12/08/2024   Vertigo 12/08/2024   Obesity, morbid, BMI 50 or higher (HCC) 10/04/2021   CVA (cerebral vascular accident) (HCC) 06/23/2021   Macrocytosis 12/07/2020   Erythrocytosis 12/07/2020   Depression 11/12/2017   Chronic heart failure with preserved ejection fraction (HFpEF) (HCC) 07/22/2017   Chest pain of uncertain etiology 07/22/2017   Chronic diastolic heart failure (HCC) 11/30/2015   Essential hypertension 11/30/2015   Obstructive sleep apnea 11/30/2015   Lymphedema of both lower extremities 11/30/2015   Cellulitis 11/01/2015   PCP:  [name], MD Pharmacy:   [id number] - [place], [place] - [address] [place] [address] Phone: [phone] Fax: [phone]     Social Drivers of Health (SDOH) Social  History: SDOH Screenings   Food Insecurity: Patient Declined (06/10/2023)   Received from [facility]  Housing: Low Risk  (06/10/2023)   Received from [facility]  Transportation Needs: Unmet Transportation Needs (06/10/2023)   Received from [facility]  Utilities: Not At Risk (06/10/2023)   Received from [facility]  Financial Resource Strain: Patient Declined (06/10/2023)   Received from [facility]  Tobacco Use: Medium Risk (06/11/2023)   Received from [facility]   SDOH Interventions:     Readmission Risk Interventions     No data to display

## 2024-12-09 NOTE — Evaluation (Signed)
 Occupational Therapy Evaluation Patient Details Name: [name]. Taborn MRN: [id number] DOB: [birth year] Today's Date: 12/09/2024   History of Present Illness   [name] is a 62yoF who comes to [facility] after waking middle of night with dizziness, spinning, imbalance. Pt reoprts symptoms were persistent and are still present upon PT evaluation in ED on 12/09/24. PMH: CHF, lymphedema, OSA, home O2. Chart shows similar presentation to ED in July 2022.     Clinical Impressions Patient was seen for OT evaluation this date. Prior to hospital admission, patient was living alone in an apartment, she did have PCS aid but recently lost her coverage so family is having to provide more assist. Mostly, patient is bedbound, she transfers from bed to [facility] and then walks short distance to her recliner where she sits 3-4 hours a day before returning to bed. She is having difficulty managing ADLs due to overall deconditoning, balance deficits related to vertigo. Patient declined any OOB mobility with OT stating PT had just been in room, she had gotten into recliner and then returned to bed after PT left and she is tired. OT provided HEP forUB mobility/scapular mobility with good return demo. Patient is close to baseline (requires A at baseline) so OT will follow while inpatient, recommending HH OT. Patient would benefit from skilled OT services to address noted impairments and functional limitations (see below for any additional details) in order to maximize safety and independence while minimizing future risk of falls, injury, and readmission. Anticipate the need for follow up OT services upon acute hospital DC.      If plan is discharge home, recommend the following:   A little help with walking and/or transfers;A little help with bathing/dressing/bathroom     Functional Status Assessment   Patient has had a recent decline in their functional status and demonstrates the ability to make significant improvements  in function in a reasonable and predictable amount of time.     Equipment Recommendations   Wheelchair (measurements OT);Wheelchair cushion (measurements OT)     Recommendations for Other Services         Precautions/Restrictions   Precautions Precautions: Fall Recall of Precautions/Restrictions: Intact Restrictions Weight Bearing Restrictions Per Provider Order: No     Mobility Bed Mobility Overal bed mobility: Needs Assistance Bed Mobility: Rolling Rolling: Supervision         General bed mobility comments: able to move self up in bed with set up    Transfers                   General transfer comment: deferred due to just getting back in bed and fatigued      Balance                                           ADL either performed or assessed with clinical judgement   ADL Overall ADL's : At baseline                                       General ADL Comments: requires A at baseline, sedentary and has assistance     Vision         Perception         Praxis         Pertinent Vitals/Pain Pain  Assessment Pain Assessment: No/denies pain     Extremity/Trunk Assessment             Communication Communication Communication: No apparent difficulties   Cognition Arousal: Alert Behavior During Therapy: WFL for tasks assessed/performed Cognition: No apparent impairments                               Following commands: Intact       Cueing  General Comments   Cueing Techniques: Verbal cues      Exercises     Shoulder Instructions      Home Living Family/patient expects to be discharged to:: Private residence Living Arrangements: Alone Available Help at Discharge: Family;Personal care attendant Type of Home: Apartment Home Access: Level entry     Home Layout: One level     Bathroom Shower/Tub: Sponge bathes at baseline         Home Equipment: BSC/3in1;Rolling  Walker (2 wheels)   Additional Comments: was receiving PCS services but recently discontinued, daughter/sister/aid have been helping intermittently      Prior Functioning/Environment Prior Level of Function : Needs assist             Mobility Comments: limited household AMB distances. ADLs Comments: minA for some- PCA previously, more recent help from sister    OT Problem List: Decreased strength;Decreased activity tolerance   OT Treatment/Interventions: Self-care/ADL training;Energy conservation;Patient/family education;Balance training      OT Goals(Current goals can be found in the care plan section)   Acute Rehab OT Goals Patient Stated Goal: to go home OT Goal Formulation: With patient Time For Goal Achievement: 12/23/24 Potential to Achieve Goals: Good ADL Goals Pt Will Perform Grooming: with modified independence;sitting Pt Will Transfer to Toilet: with modified independence;bedside commode Pt Will Perform Toileting - Clothing Manipulation and hygiene: with modified independence;sit to/from stand   OT Frequency:  Min 1X/week    Co-evaluation              AM-PAC OT 6 Clicks Daily Activity     Outcome Measure Help from another person eating meals?: None Help from another person taking care of personal grooming?: A Little Help from another person toileting, which includes using toliet, bedpan, or urinal?: A Little Help from another person bathing (including washing, rinsing, drying)?: A Little Help from another person to put on and taking off regular upper body clothing?: A Little Help from another person to put on and taking off regular lower body clothing?: A Little 6 Click Score: 19   End of Session Equipment Utilized During Treatment: Oxygen   Activity Tolerance: Patient limited by fatigue Patient left: in bed;with call bell/phone within reach;with bed alarm set  OT Visit Diagnosis: Unsteadiness on feet (R26.81)                Time: 1335-1401 OT  Time Calculation (min): 26 min Charges:  OT General Charges $OT Visit: 1 Visit OT Evaluation $OT Eval Low Complexity: 1 Low  Rogers Clause, OT/L MSOT, 12/09/2024

## 2024-12-09 NOTE — Progress Notes (Signed)
 " PROGRESS NOTE    [name]. [name]  FMW:[id number] DOB: 07-23-1961 DOA: 12/08/2024 PCP: [name], MD    Brief Narrative:  63 y.o. year old female with medical history of hypertension, hyperlipidemia, HFpEF (EF 60%, G2 DD and 06/2021), OSA and chronic hypoxic respiratory failure on 2 L secondary to above, presenting to the ED with shortness of breath.   Patient reports waking up this morning with significant shortness of breath.  She reports this was not present when she went to sleep last night.  She states she also had dizziness that started after waking up. She states it has been constant without any periods without it. States this is similar to previous episode but more severe. Denies any URI symptoms in the last month.  Denies any fevers or chills.   On arrival to the ED patient was noted to be HDS stable.  Lab work and imaging obtained.  CBC and CMP is unremarkable.  Respiratory panel was negative for COVID, flu, RSV.  proBNP is 214.  Troponin is less than 15.  Chest x-ray shows signs of volume overload with pulmonary vascular congestion.  It is overall poor study given the rotation.  Given patient presentation, TRH contacted for admission.   Assessment & Plan:   Principal Problem:   Vertigo Active Problems:   Essential hypertension   Obstructive sleep apnea   Chronic heart failure with preserved ejection fraction (HFpEF) (HCC)   Obesity, morbid, BMI 50 or higher (HCC)   Acute exacerbation of CHF (congestive heart failure) (HCC)  Acute Heart Failure Exacerbation Pt with hx of CHF and signs of volume overload on exam and imaging suggestive of CHF exacerbation. Suspect her BNP is normal given her BMI.  She is supposed to be on Lasix  and metolazone  but appears she is not taking these.  Given her last echocardiogram was in 2022.  Will repeat echocardiogram this admission. Plan: Lasix  40 mg IV twice daily Strict I's and O's, daily weights, fluid restrict Repeat 2D  echocardiogram Consider cardiology consultation based on results  Vertigo Seen in consultation by physical therapy.  Patient's body habitus precludes MRI however CTA is negative for LVO.  Physical therapy evaluation suggest a peripheral vestibular issue, not BPPV.   Plan:  As needed meclizine   Continue therapy evaluations   Hypertension Resume home losartan  100 mg daily Diuretic as above Consider addition of AV nodal blocking agents once echocardiogram is resulted IV labetalol  as needed  Hyperlipidemia Not currently on statin Check lipid panel in the a.m.   Severe obesity BMI is 80.  She will benefit from therapies for weight loss.  These needs to be considered on outpatient basis.  Did discuss lifestyle modifications and overall benefits of weight loss on her health.  This is a complicating factor in overall care and prognosis   OSA Nightly CPAP  DVT prophylaxis: SQH Code Status: Full Family Communication: None Disposition Plan: Status is: Observation The patient will require care spanning > 2 midnights and should be moved to inpatient because: Acute decompensated heart failure on IV diuretic   Level of care: Telemetry  Consultants:  None  Procedures:  None  Antimicrobials: None   Subjective: Seen and examined.  Resting in bed.  Family member at bedside.  No pain complaints.  Reports shortness of breath is improving.  Objective: Vitals:   12/09/24 0600 12/09/24 0604 12/09/24 0800 12/09/24 1130  BP:  (!) 162/147    Pulse:  73  86  Resp:  15  14  Temp: 98.1 F (36.7 C)  97.7 F (36.5 C)   TempSrc: Oral     SpO2:  100%  100%  Weight:      Height:        Intake/Output Summary (Last 24 hours) at 12/09/2024 1235 Last data filed at 12/09/2024 1126 Gross per 24 hour  Intake --  Output 2100 ml  Net -2100 ml   Filed Weights   12/08/24 1140  Weight: (!) 199.6 kg    Examination:  General exam: NAD Respiratory system: Crackles bilaterally.  Diminished  air entry.  Normal work of breathing.  2 L Cardiovascular system: S1-S2, RRR, no murmurs, 1+ pedal edema BLE Gastrointestinal system: Obese, soft, NT/ND, normal bowel sounds Central nervous system: Alert and oriented. No focal neurological deficits. Extremities: Symmetric 5 x 5 power. Skin: No rashes, lesions or ulcers Psychiatry: Judgement and insight appear normal. Mood & affect appropriate.     Data Reviewed: I have personally reviewed following labs and imaging studies  CBC: Recent Labs  Lab 12/08/24 1152 12/09/24 0844  WBC 7.9 6.5  NEUTROABS 3.2  --   HGB 12.5 13.2  HCT 39.0 42.7  MCV 99.0 101.7*  PLT 225 213   Basic Metabolic Panel: Recent Labs  Lab 12/08/24 1409 12/09/24 0844  NA 141 139  K 4.0 3.9  CL 101 99  CO2 31 33*  GLUCOSE 113* 89  BUN 12 11  CREATININE 0.58 0.68  CALCIUM  9.3 9.4  MG 1.8  --    GFR: Estimated Creatinine Clearance: 123.3 mL/min (by C-G formula based on SCr of 0.68 mg/dL). Liver Function Tests: Recent Labs  Lab 12/08/24 1409  AST 19  ALT 17  ALKPHOS 78  BILITOT 0.3  PROT 6.9  ALBUMIN  3.7   No results for input(s): LIPASE, AMYLASE in the last 168 hours. No results for input(s): AMMONIA in the last 168 hours. Coagulation Profile: No results for input(s): INR, PROTIME in the last 168 hours. Cardiac Enzymes: No results for input(s): CKTOTAL, CKMB, CKMBINDEX, TROPONINI in the last 168 hours. BNP (last 3 results) Recent Labs    12/08/24 1409  PROBNP 214.0   HbA1C: No results for input(s): HGBA1C in the last 72 hours. CBG: No results for input(s): GLUCAP in the last 168 hours. Lipid Profile: No results for input(s): CHOL, HDL, LDLCALC, TRIG, CHOLHDL, LDLDIRECT in the last 72 hours. Thyroid  Function Tests: No results for input(s): TSH, T4TOTAL, FREET4, T3FREE, THYROIDAB in the last 72 hours. Anemia Panel: No results for input(s): VITAMINB12, FOLATE, FERRITIN, TIBC, IRON,  RETICCTPCT in the last 72 hours. Sepsis Labs: No results for input(s): PROCALCITON, LATICACIDVEN in the last 168 hours.  Recent Results (from the past 240 hours)  Resp panel by RT-PCR (RSV, Flu A&B, Covid) Anterior Nasal Swab     Status: None   Collection Time: 12/08/24 11:52 AM   Specimen: Anterior Nasal Swab  Result Value Ref Range Status   SARS Coronavirus 2 by RT PCR NEGATIVE NEGATIVE Final    Comment: (NOTE) SARS-CoV-2 target nucleic acids are NOT DETECTED.  The SARS-CoV-2 RNA is generally detectable in upper respiratory specimens during the acute phase of infection. The lowest concentration of SARS-CoV-2 viral copies this assay can detect is 138 copies/mL. A negative result does not preclude SARS-Cov-2 infection and should not be used as the sole basis for treatment or other patient management decisions. A negative result may occur with  improper specimen collection/handling, submission of specimen other than nasopharyngeal swab, presence of viral  mutation(s) within the areas targeted by this assay, and inadequate number of viral copies(<138 copies/mL). A negative result must be combined with clinical observations, patient history, and epidemiological information. The expected result is Negative.  Fact Sheet for Patients:  bloggercourse.com  Fact Sheet for Healthcare Providers:  seriousbroker.it  This test is no t yet approved or cleared by the [place]  FDA and  has been authorized for detection and/or diagnosis of SARS-CoV-2 by FDA under an Emergency Use Authorization (EUA). This EUA will remain  in effect (meaning this test can be used) for the duration of the COVID-19 declaration under Section 564(b)(1) of the Act, 21 U.S.C.section 360bbb-3(b)(1), unless the authorization is terminated  or revoked sooner.       Influenza A by PCR NEGATIVE NEGATIVE Final   Influenza B by PCR NEGATIVE NEGATIVE Final     Comment: (NOTE) The Xpert Xpress SARS-CoV-2/FLU/RSV plus assay is intended as an aid in the diagnosis of influenza from Nasopharyngeal swab specimens and should not be used as a sole basis for treatment. Nasal washings and aspirates are unacceptable for Xpert Xpress SARS-CoV-2/FLU/RSV testing.  Fact Sheet for Patients: bloggercourse.com  Fact Sheet for Healthcare Providers: seriousbroker.it  This test is not yet approved or cleared by the [place]  FDA and has been authorized for detection and/or diagnosis of SARS-CoV-2 by FDA under an Emergency Use Authorization (EUA). This EUA will remain in effect (meaning this test can be used) for the duration of the COVID-19 declaration under Section 564(b)(1) of the Act, 21 U.S.C. section 360bbb-3(b)(1), unless the authorization is terminated or revoked.     Resp Syncytial Virus by PCR NEGATIVE NEGATIVE Final    Comment: (NOTE) Fact Sheet for Patients: bloggercourse.com  Fact Sheet for Healthcare Providers: seriousbroker.it  This test is not yet approved or cleared by the [place]  FDA and has been authorized for detection and/or diagnosis of SARS-CoV-2 by FDA under an Emergency Use Authorization (EUA). This EUA will remain in effect (meaning this test can be used) for the duration of the COVID-19 declaration under Section 564(b)(1) of the Act, 21 U.S.C. section 360bbb-3(b)(1), unless the authorization is terminated or revoked.  Performed at [facility], [address] [place], [place] [address]          Radiology Studies: CT ANGIO HEAD NECK W WO CM Result Date: 12/08/2024 CLINICAL DATA:  Initial evaluation for acute neuro deficit, stroke. EXAM: CT ANGIOGRAPHY HEAD AND NECK WITH AND WITHOUT CONTRAST TECHNIQUE: Multidetector CT imaging of the head and neck was performed using the standard protocol during bolus  administration of intravenous contrast. Multiplanar CT image reconstructions and MIPs were obtained to evaluate the vascular anatomy. Carotid stenosis measurements (when applicable) are obtained utilizing NASCET criteria, using the distal internal carotid diameter as the denominator. RADIATION DOSE REDUCTION: This exam was performed according to the departmental dose-optimization program which includes automated exposure control, adjustment of the mA and/or kV according to patient size and/or use of iterative reconstruction technique. CONTRAST:  75mL OMNIPAQUE  IOHEXOL  350 MG/ML SOLN COMPARISON:  Prior exam from 06/24/2021. FINDINGS: CT HEAD FINDINGS Brain: Cerebral volume within normal limits for patient age. No acute intracranial hemorrhage. No acute large vessel territory infarct. No mass lesion, midline shift, or mass effect. Ventricles are normal in size without hydrocephalus. No extra-axial fluid collection. Vascular: No abnormal hyperdense vessel. Skull: Scalp soft tissues demonstrate no acute abnormality. Calvarium intact. Sinuses/Orbits: Right gaze noted. Remote posttraumatic defect at the left lamina papyracea noted. Scattered mucosal thickening about the  ethmoidal air cells and maxillary sinuses. No mastoid effusion. CTA NECK FINDINGS Aortic arch: Examination severely limited by habitus, markedly limiting assessment. Aortic arch within normal limits for caliber with standard branch pattern. Aortic atherosclerosis. No visible stenosis about the origin the great vessels. Right carotid system: Right common and internal carotid arteries are patent. Partial medialization into the retropharyngeal space. No visible dissection. Atheromatous change about the right carotid bulb with approximate 50% stenosis, although evaluation is limited by habitus (series 13, image 186). Left carotid system: Left common and internal carotid arteries are grossly patent. Partial medialization into the left retropharyngeal space. No  visible dissection. Atheromatous change about the left carotid bulb but with no visible stenosis. Vertebral arteries: Evaluation of the vertebral arteries is severely limited within the neck. Both vertebral arteries grossly patent. No visible stenosis or dissection. Skeleton: No visible discrete or worrisome osseous lesions. Other neck: No other obvious abnormality. Upper chest: No other visible abnormality. Review of the MIP images confirms the above findings CTA HEAD FINDINGS Anterior circulation: Evaluation of the intracranial circulation limited by habitus. Atheromatous change about the carotid siphons bilaterally, left worse than right. Up to moderate stenosis at the left siphon. No significant stenosis visible about the right siphon. A1 segments patent bilaterally. Normal anterior communicating complex. Both ACAs patent without visible stenosis. 5 mm saccular lesion arising from the distal right ACA, consistent with aneurysm (series 13, image 56). No M1 stenosis or occlusion. No visible proximal MCA branch occlusion. Distal MCA branches grossly perfused and symmetric. Posterior circulation: Both V4 segments grossly patent without visible stenosis or other abnormality. Neither PICA well visualized. Basilar patent without visible stenosis. Superior cerebellar arteries patent bilaterally. Left PCA primarily supplied via the basilar. Right PCA supplied via a hypoplastic right P1 segment and prominent right posterior communicating artery. PCAs grossly patent to their distal aspects without visible stenosis. Venous sinuses: Grossly patent allowing for timing the contrast bolus. Anatomic variants: None significant. Review of the MIP images confirms the above findings IMPRESSION: 1. Markedly limited exam due to habitus. 2. Grossly negative CTA for large vessel occlusion or other emergent finding. 3. Atheromatous change about the right carotid bulb with approximate 50% stenosis, difficult to assess accurately given  limitations of this exam. 4. Atheromatous change about the left carotid siphon with up to moderate stenosis. 5. 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 6. No other acute intracranial abnormality. Aortic Atherosclerosis (ICD10-I70.0). Electronically Signed   By: Morene Hoard M.D.   On: 12/08/2024 21:03   DG Chest Port 1 View Result Date: 12/08/2024 CLINICAL DATA:  10026 Shortness of breath 10026 EXAM: PORTABLE CHEST - 1 VIEW COMPARISON:  11/01/2015 FINDINGS: Central pulmonary vascular congestion. No focal airspace consolidation, pleural effusion, or pneumothorax. Moderate cardiomegaly. Tortuous aorta with aortic atherosclerosis. No acute fracture or destructive lesions. Multilevel thoracic osteophytosis. IMPRESSION: Moderate cardiomegaly with central pulmonary vascular congestion. No overt pulmonary edema. Electronically Signed   By: Rogelia Myers M.D.   On: 12/08/2024 12:17        Scheduled Meds:  furosemide   40 mg Intravenous BID   heparin   5,000 Units Subcutaneous Q8H   sodium chloride  flush  3 mL Intravenous Q12H   Continuous Infusions:   LOS: 0 days    Calvin KATHEE Robson, MD [facility]   If 7PM-7AM, please contact night-coverage  12/09/2024, 12:35 PM   "

## 2024-12-09 NOTE — Evaluation (Addendum)
 Physical Therapy Evaluation Patient Details Name: [name] MRN: [id number] DOB: July 03, 1961 Today's Date: 12/09/2024  History of Present Illness  [name] is a 62yoF who comes to [facility] after waking middle of night with dizziness, spinning, imbalance. Pt reoprts symptoms were persistent and are still present upon PT evaluation in ED on 12/09/24. PMH: CHF, lymphedema, OSA, home O2. Chart shows similar presentation to ED in July 2022.  Clinical Impression  Exam notable for spontaneous left beat horizontal nystagmus that persists during eye exam, as well as bed mobility. Pt denies any congruent changes to hearing, baseline tinnitus, but does endorse more fuzzy vision, I would attribute this to nonstop nystagmus. Pt also with baseline chronic strabismus. Pt has normal finger to nose, finger to thumb, and finger to target testing. Pt does fairly well with bed mobility and transfers, essentially baseline in terms of independence and effort, but fluctuations in vertigo, nystagmus, equilibrium, and nausea complicate safe performance of mobility without assistance. Assistance with family is strongly recommended for return to home as desired by patient. Recommend HHPT, however no acute DME needs at this time. Will continue to follow.       If plan is discharge home, recommend the following: A little help with walking and/or transfers;Assist for transportation;Assistance with cooking/housework   Can travel by private vehicle        Equipment Recommendations None recommended by PT  Recommendations for Other Services       Functional Status Assessment Patient has had a recent decline in their functional status and demonstrates the ability to make significant improvements in function in a reasonable and predictable amount of time.     Precautions / Restrictions Precautions Precautions: Fall Restrictions Weight Bearing Restrictions Per Provider Order: No      Mobility  Bed Mobility Overal  bed mobility: Needs Assistance Bed Mobility: Supine to Sit, Rolling Rolling: Supervision (rolls onto right side; has significant brief increase in vertigo and brief increase in nystagmus amplitude, then better able to come upright to EOB after ~45sec)   Supine to sit: Supervision, HOB elevated     General bed mobility comments: heavy bed rail use; reports easier than her current home bed    Transfers Overall transfer level: Needs assistance Equipment used: Rolling walker (2 wheels) Transfers: Sit to/from Stand, Bed to chair/wheelchair/BSC Sit to Stand: Supervision   Step pivot transfers: Supervision       General transfer comment: well vesed technique for what is essentially a 1RM transfer; high velocity trunk, near max effort; needs slght elevation of bed for 2nd transfer.    Ambulation/Gait Ambulation/Gait assistance:  (deferred due to acute vertigo)                Stairs            Wheelchair Mobility     Tilt Bed    Modified Rankin (Stroke Patients Only)       Vestibular:   Oculomotor Screening:   Gaze fixation Spontaneous left bearing horizontal nystagmus  horizontal pursuits Left to Right WNL; Rt to Lt c heavy saccadic intrusion  Rt/Lt  gaze holding Spontaneous left bearing horizontal nystagmus  vertical pursuits Spontaneous left bearing horizontal nystagmus  Horizontal VOR   Vertical VOR    Vision suppressed:   neutral gaze, spontaneous nystagmus   Rt/Lt  gaze holding   up/down gaze holding    Cross cover test:  -baseline strabismus, not tested  Head Impulse Test: -Right: not tested due to nausea, spontaneous nystagmus   -  Left: not tested due to nausea, spontaneous nystagmus    CANAL TESTING: [place]pike:   -Right: not indicated  -Left: not indicated Roll Test:   -Right: constant spontaneous left bearing horizontal nystagmus  -Left: constant spontaneous left bearing horizontal nystagmus *negative canal test, whereas positive  horizontal canal would require a change in nystagmus direction between testing sides.   Finger to nose: WNL bilat Fingers to thumb:WNL bilat Finger to target: WNL bilat                                            Pertinent Vitals/Pain Pain Assessment Pain Assessment: No/denies pain (some leg pain when elevating in recliner)    Home Living Family/patient expects to be discharged to:: Private residence Living Arrangements: Alone Available Help at Discharge: Family;Personal care attendant (recently lost PCA services due to a change in insurance plans) Type of Home: Apartment Home Access: Level entry       Home Layout: One level Home Equipment:  (BRW, manual WC (hoping to get an [employer]); nonadjustable bed) Additional Comments: sister/DTR have been stepping up more recently sicne losing PCA services    Prior Function Prior Level of Function : Needs assist             Mobility Comments: limited household AMB distances. ADLs Comments: minA for some- PCA previously, more recent help from sister     Extremity/Trunk Assessment                Communication        Cognition                                         Cueing       General Comments      Exercises     Assessment/Plan    PT Assessment Patient needs continued PT services  PT Problem List Decreased balance;Decreased mobility;Decreased activity tolerance       PT Treatment Interventions DME instruction;Gait training;Patient/family education;Therapeutic activities;Functional mobility training;Therapeutic exercise;Balance training;Neuromuscular re-education    PT Goals (Current goals can be found in the Care Plan section)  Acute Rehab PT Goals Patient Stated Goal: be able to DC back to home; resolve vertigo PT Goal Formulation: With patient Time For Goal Achievement: 12/23/24 Potential to Achieve Goals: Good    Frequency Min 2X/week      Co-evaluation               AM-PAC PT 6 Clicks Mobility  Outcome Measure Help needed turning from your back to your side while in a flat bed without using bedrails?: A Little Help needed moving from lying on your back to sitting on the side of a flat bed without using bedrails?: A Little Help needed moving to and from a bed to a chair (including a wheelchair)?: A Little Help needed standing up from a chair using your arms (e.g., wheelchair or bedside chair)?: A Little Help needed to walk in hospital room?: A Lot Help needed climbing 3-5 steps with a railing? : A Lot 6 Click Score: 16    End of Session Equipment Utilized During Treatment: Oxygen  Activity Tolerance: Patient tolerated treatment well;Treatment limited secondary to medical complications (Comment);Other (comment) (flucutating vertigo) Patient left: in chair;with call bell/phone within reach Nurse Communication: Mobility  status PT Visit Diagnosis: Unsteadiness on feet (R26.81);Other abnormalities of gait and mobility (R26.89);Muscle weakness (generalized) (M62.81)    Time: 8891-8841 PT Time Calculation (min) (ACUTE ONLY): 50 min   Charges:   PT Evaluation $PT Eval Moderate Complexity: 1 Mod PT Treatments $Therapeutic Activity: 8-22 mins PT General Charges $$ ACUTE PT VISIT: 1 Visit    1:15 PM, 12/09/2024 Peggye JAYSON Linear, PT, DPT Physical Therapist - [facility]  [phone] (ASCOM)    Samie Reasons C 12/09/2024, 1:08 PM

## 2024-12-10 DIAGNOSIS — R42 Dizziness and giddiness: Secondary | ICD-10-CM | POA: Diagnosis not present

## 2024-12-10 LAB — ECHOCARDIOGRAM COMPLETE
AR max vel: 4.18 cm2
AV Area VTI: 3.97 cm2
AV Area mean vel: 3.97 cm2
AV Mean grad: 4 mmHg
AV Peak grad: 8 mmHg
Ao pk vel: 1.41 m/s
Area-P 1/2: 3.91 cm2
Calc EF: 82.1 %
Height: 62 in
MV VTI: 5.07 cm2
S' Lateral: 2.7 cm
Single Plane A2C EF: 86.7 %
Single Plane A4C EF: 79 %
Weight: 7040 [oz_av]

## 2024-12-10 LAB — BASIC METABOLIC PANEL WITH GFR
Anion gap: 11 (ref 5–15)
BUN: 17 mg/dL (ref 8–23)
CO2: 33 mmol/L — ABNORMAL HIGH (ref 22–32)
Calcium: 9.4 mg/dL (ref 8.9–10.3)
Chloride: 95 mmol/L — ABNORMAL LOW (ref 98–111)
Creatinine, Ser: 0.92 mg/dL (ref 0.44–1.00)
GFR, Estimated: >60 mL/min
Glucose, Bld: 98 mg/dL (ref 70–99)
Potassium: 3.9 mmol/L (ref 3.5–5.1)
Sodium: 139 mmol/L (ref 135–145)

## 2024-12-10 LAB — LIPID PANEL
Cholesterol: 150 mg/dL (ref 0–200)
HDL: 47 mg/dL
LDL Cholesterol: 88 mg/dL (ref 0–99)
Total CHOL/HDL Ratio: 3.2 ratio
Triglycerides: 76 mg/dL
VLDL: 15 mg/dL (ref 0–40)

## 2024-12-10 LAB — MAGNESIUM: Magnesium: 1.6 mg/dL — ABNORMAL LOW (ref 1.7–2.4)

## 2024-12-10 LAB — GLUCOSE, CAPILLARY: Glucose-Capillary: 101 mg/dL — ABNORMAL HIGH (ref 70–99)

## 2024-12-10 MED ORDER — OXYCODONE HCL 5 MG PO TABS
5.0000 mg | ORAL_TABLET | ORAL | Status: DC | PRN
  Administered 2024-12-14 (×2): 10 mg via ORAL
  Administered 2024-12-15: 5 mg via ORAL
  Administered 2024-12-15: 10 mg via ORAL
  Filled 2024-12-10 (×4): qty 2
  Filled 2024-12-10: qty 1

## 2024-12-10 MED ORDER — ATENOLOL 50 MG PO TABS
50.0000 mg | ORAL_TABLET | Freq: Two times a day (BID) | ORAL | Status: DC
  Administered 2024-12-10 – 2024-12-11 (×3): 50 mg via ORAL
  Filled 2024-12-10 (×4): qty 1

## 2024-12-10 MED ORDER — MAGNESIUM SULFATE 2 GM/50ML IV SOLN
2.0000 g | Freq: Once | INTRAVENOUS | Status: AC
  Administered 2024-12-10: 2 g via INTRAVENOUS
  Filled 2024-12-10: qty 50

## 2024-12-10 MED ORDER — NYSTATIN 100000 UNIT/GM EX CREA
TOPICAL_CREAM | Freq: Three times a day (TID) | CUTANEOUS | Status: DC
  Administered 2024-12-10 (×2): 1 via TOPICAL
  Filled 2024-12-10 (×3): qty 30

## 2024-12-10 MED ORDER — POTASSIUM CHLORIDE CRYS ER 20 MEQ PO TBCR
40.0000 meq | EXTENDED_RELEASE_TABLET | Freq: Once | ORAL | Status: AC
  Administered 2024-12-10: 40 meq via ORAL
  Filled 2024-12-10: qty 2

## 2024-12-10 NOTE — Progress Notes (Signed)
 " PROGRESS NOTE    [name]  FMW:[id number] DOB: Jun 01, 1961 DOA: 12/08/2024 PCP: [name], MD    Brief Narrative:  63 y.o. year old female with medical history of hypertension, hyperlipidemia, HFpEF (EF 60%, G2 DD and 06/2021), OSA and chronic hypoxic respiratory failure on 2 L secondary to above, presenting to the ED with shortness of breath.   Patient reports waking up this morning with significant shortness of breath.  She reports this was not present when she went to sleep last night.  She states she also had dizziness that started after waking up. She states it has been constant without any periods without it. States this is similar to previous episode but more severe. Denies any URI symptoms in the last month.  Denies any fevers or chills.   On arrival to the ED patient was noted to be HDS stable.  Lab work and imaging obtained.  CBC and CMP is unremarkable.  Respiratory panel was negative for COVID, flu, RSV.  proBNP is 214.  Troponin is less than 15.  Chest x-ray shows signs of volume overload with pulmonary vascular congestion.  It is overall poor study given the rotation.  Given patient presentation, [facility] contacted for admission.   Assessment & Plan:   Principal Problem:   Vertigo Active Problems:   Essential hypertension   Obstructive sleep apnea   Chronic heart failure with preserved ejection fraction (HFpEF) (HCC)   Obesity, morbid, BMI 50 or higher (HCC)   Acute exacerbation of CHF (congestive heart failure) (HCC)  Acute Heart Failure Exacerbation Pt with hx of CHF and signs of volume overload on exam and imaging suggestive of CHF exacerbation. Suspect her BNP is normal given her BMI.  She is supposed to be on Lasix  and metolazone  but appears she is not taking these.  Given her last echocardiogram was in 2022.  Will repeat echocardiogram this admission.  Repeat echocardiogram shows a supranormal ejection fraction with grade 1 diastolic dysfunction.  I suspect the  echocardiogram may be impacted by patient's body habitus. Plan: Continue Lasix  40 mg IV twice daily Strict I's and O's, daily weights, fluid restrict Consider cardiology inpatient consultation versus outpatient follow-up  Vertigo Seen in consultation by physical therapy.  Patient's body habitus precludes MRI however CTA is negative for LVO.  Physical therapy evaluation suggest a peripheral vestibular issue, not BPPV.   Plan:  As needed meclizine   Continue therapy evaluations   Hypertension Resume home losartan  100 mg daily Diuretic as above Restart home atenolol  50 mg twice daily IV labetalol  as needed  Hyperlipidemia Not currently on statin Lipid panel reassuring Will not start statin at this time   Severe obesity BMI is 80.  She will benefit from therapies for weight loss.  These needs to be considered on outpatient basis.  Did discuss lifestyle modifications and overall benefits of weight loss on her health.  This is a complicating factor in overall care and prognosis   OSA Nightly CPAP  DVT prophylaxis: SQH Code Status: Full Family Communication: None Disposition Plan: Status is: Observation The patient will require care spanning > 2 midnights and should be moved to inpatient because: Acute decompensated diastolic congestive heart failure IV diuretic   Level of care: Telemetry  Consultants:  None  Procedures:  None  Antimicrobials: None   Subjective: Seen and examined.  Reports that shortness of breath is improving.  No family members at bedside this morning.  Objective: Vitals:   12/10/24 0511 12/10/24 0808 12/10/24  1051 12/10/24 1248  BP:  (!) 152/80 (!) 152/80 134/69  Pulse:  77 77 (!) 56  Resp:  18  16  Temp:  98 F (36.7 C)  98.1 F (36.7 C)  TempSrc:      SpO2:  99%  100%  Weight: (!) 190.6 kg     Height:        Intake/Output Summary (Last 24 hours) at 12/10/2024 1433 Last data filed at 12/10/2024 1055 Gross per 24 hour  Intake 486 ml   Output 250 ml  Net 236 ml   Filed Weights   12/08/24 1140 12/10/24 0511  Weight: (!) 199.6 kg (!) 190.6 kg    Examination:  General exam: No acute distress Respiratory system: Basilar crackles.  Diminished air entry.  Normal work of breathing.  2 L Cardiovascular system: S1-S2, RRR, no murmurs, 1+ pedal edema BLE Gastrointestinal system: Obese, soft, NT/ND, normal bowel sounds Central nervous system: Alert and oriented. No focal neurological deficits. Extremities: Symmetric 5 x 5 power. Skin: No rashes, lesions or ulcers Psychiatry: Judgement and insight appear normal. Mood & affect appropriate.     Data Reviewed: I have personally reviewed following labs and imaging studies  CBC: Recent Labs  Lab 12/08/24 1152 12/09/24 0844  WBC 7.9 6.5  NEUTROABS 3.2  --   HGB 12.5 13.2  HCT 39.0 42.7  MCV 99.0 101.7*  PLT 225 213   Basic Metabolic Panel: Recent Labs  Lab 12/08/24 1409 12/09/24 0844 12/10/24 0338  NA 141 139 139  K 4.0 3.9 3.9  CL 101 99 95*  CO2 31 33* 33*  GLUCOSE 113* 89 98  BUN 12 11 17   CREATININE 0.58 0.68 0.92  CALCIUM  9.3 9.4 9.4  MG 1.8  --  1.6*   GFR: Estimated Creatinine Clearance: 103.7 mL/min (by C-G formula based on SCr of 0.92 mg/dL). Liver Function Tests: Recent Labs  Lab 12/08/24 1409  AST 19  ALT 17  ALKPHOS 78  BILITOT 0.3  PROT 6.9  ALBUMIN  3.7   No results for input(s): LIPASE, AMYLASE in the last 168 hours. No results for input(s): AMMONIA in the last 168 hours. Coagulation Profile: No results for input(s): INR, PROTIME in the last 168 hours. Cardiac Enzymes: No results for input(s): CKTOTAL, CKMB, CKMBINDEX, TROPONINI in the last 168 hours. BNP (last 3 results) Recent Labs    12/08/24 1409  PROBNP 214.0   HbA1C: No results for input(s): HGBA1C in the last 72 hours. CBG: Recent Labs  Lab 12/10/24 0807  GLUCAP 101*   Lipid Profile: Recent Labs    12/10/24 0338  CHOL 150  HDL 47   LDLCALC 88  TRIG 76  CHOLHDL 3.2   Thyroid  Function Tests: No results for input(s): TSH, T4TOTAL, FREET4, T3FREE, THYROIDAB in the last 72 hours. Anemia Panel: No results for input(s): VITAMINB12, FOLATE, FERRITIN, TIBC, IRON, RETICCTPCT in the last 72 hours. Sepsis Labs: No results for input(s): PROCALCITON, LATICACIDVEN in the last 168 hours.  Recent Results (from the past 240 hours)  Resp panel by RT-PCR (RSV, Flu A&B, Covid) Anterior Nasal Swab     Status: None   Collection Time: 12/08/24 11:52 AM   Specimen: Anterior Nasal Swab  Result Value Ref Range Status   SARS Coronavirus 2 by RT PCR NEGATIVE NEGATIVE Final    Comment: (NOTE) SARS-CoV-2 target nucleic acids are NOT DETECTED.  The SARS-CoV-2 RNA is generally detectable in upper respiratory specimens during the acute phase of infection. The lowest concentration of  SARS-CoV-2 viral copies this assay can detect is 138 copies/mL. A negative result does not preclude SARS-Cov-2 infection and should not be used as the sole basis for treatment or other patient management decisions. A negative result may occur with  improper specimen collection/handling, submission of specimen other than nasopharyngeal swab, presence of viral mutation(s) within the areas targeted by this assay, and inadequate number of viral copies(<138 copies/mL). A negative result must be combined with clinical observations, patient history, and epidemiological information. The expected result is Negative.  Fact Sheet for Patients:  bloggercourse.com  Fact Sheet for Healthcare Providers:  seriousbroker.it  This test is no t yet approved or cleared by the [place]  FDA and  has been authorized for detection and/or diagnosis of SARS-CoV-2 by FDA under an Emergency Use Authorization (EUA). This EUA will remain  in effect (meaning this test can be used) for the duration of  the COVID-19 declaration under Section 564(b)(1) of the Act, 21 U.S.C.section 360bbb-3(b)(1), unless the authorization is terminated  or revoked sooner.       Influenza A by PCR NEGATIVE NEGATIVE Final   Influenza B by PCR NEGATIVE NEGATIVE Final    Comment: (NOTE) The Xpert Xpress SARS-CoV-2/FLU/RSV plus assay is intended as an aid in the diagnosis of influenza from Nasopharyngeal swab specimens and should not be used as a sole basis for treatment. Nasal washings and aspirates are unacceptable for Xpert Xpress SARS-CoV-2/FLU/RSV testing.  Fact Sheet for Patients: bloggercourse.com  Fact Sheet for Healthcare Providers: seriousbroker.it  This test is not yet approved or cleared by the [place]  FDA and has been authorized for detection and/or diagnosis of SARS-CoV-2 by FDA under an Emergency Use Authorization (EUA). This EUA will remain in effect (meaning this test can be used) for the duration of the COVID-19 declaration under Section 564(b)(1) of the Act, 21 U.S.C. section 360bbb-3(b)(1), unless the authorization is terminated or revoked.     Resp Syncytial Virus by PCR NEGATIVE NEGATIVE Final    Comment: (NOTE) Fact Sheet for Patients: bloggercourse.com  Fact Sheet for Healthcare Providers: seriousbroker.it  This test is not yet approved or cleared by the [place]  FDA and has been authorized for detection and/or diagnosis of SARS-CoV-2 by FDA under an Emergency Use Authorization (EUA). This EUA will remain in effect (meaning this test can be used) for the duration of the COVID-19 declaration under Section 564(b)(1) of the Act, 21 U.S.C. section 360bbb-3(b)(1), unless the authorization is terminated or revoked.  Performed at [facility], [address] [place], [place] [address]          Radiology Studies: ECHOCARDIOGRAM COMPLETE Result  Date: 12/10/2024    ECHOCARDIOGRAM REPORT   Patient Name:   [name] Date of Exam: 12/09/2024 Medical Rec #:  [id number]     Height:       62.0 in Accession #:    [id number]    Weight:       440.0 lb Date of Birth:  01-22-1961    BSA:          2.673 m Patient Age:    62 years      BP:           162/147 mmHg Patient Gender: F             HR:           88 bpm. Exam Location:  [facility] Procedure: 2D Echo, Color Doppler, Cardiac Doppler and Intracardiac  Opacification Agent (Both Spectral and Color Flow Doppler were            utilized during procedure). Indications:     CHF-Acute Diastolic I50.31  History:         Patient has prior history of Echocardiogram examinations, most                  recent 06/24/2021. CHF.  Sonographer:     Ashley McNeely-Sloane Referring Phys:  [id number] [facility] Diagnosing Phys: Denyse Bathe IMPRESSIONS  1. Left ventricular ejection fraction, by estimation, is 70 to 75%. The left ventricle has hyperdynamic function. The left ventricle has no regional wall motion abnormalities. Left ventricular diastolic parameters are consistent with Grade I diastolic dysfunction (impaired relaxation).  2. Right ventricular systolic function is normal. The right ventricular size is normal.  3. The mitral valve is normal in structure. Trivial mitral valve regurgitation. No evidence of mitral stenosis.  4. The aortic valve is normal in structure. Aortic valve regurgitation is not visualized. No aortic stenosis is present.  5. The inferior vena cava is normal in size with greater than 50% respiratory variability, suggesting right atrial pressure of 3 mmHg. FINDINGS  Left Ventricle: Left ventricular ejection fraction, by estimation, is 70 to 75%. The left ventricle has hyperdynamic function. The left ventricle has no regional wall motion abnormalities. Strain was performed and the global longitudinal strain is indeterminate. The left ventricular internal cavity size was normal in size. There is  borderline left ventricular hypertrophy. Left ventricular diastolic parameters are consistent with Grade I diastolic dysfunction (impaired relaxation). Right Ventricle: The right ventricular size is normal. No increase in right ventricular wall thickness. Right ventricular systolic function is normal. Left Atrium: Left atrial size was normal in size. Right Atrium: Right atrial size was normal in size. Pericardium: There is no evidence of pericardial effusion. Mitral Valve: The mitral valve is normal in structure. Trivial mitral valve regurgitation. No evidence of mitral valve stenosis. MV peak gradient, 2.3 mmHg. The mean mitral valve gradient is 1.0 mmHg. Tricuspid Valve: The tricuspid valve is normal in structure. Tricuspid valve regurgitation is not demonstrated. No evidence of tricuspid stenosis. Aortic Valve: The aortic valve is normal in structure. Aortic valve regurgitation is not visualized. No aortic stenosis is present. Aortic valve mean gradient measures 4.0 mmHg. Aortic valve peak gradient measures 8.0 mmHg. Aortic valve area, by VTI measures 3.97 cm. Pulmonic Valve: The pulmonic valve was normal in structure. Pulmonic valve regurgitation is not visualized. No evidence of pulmonic stenosis. Aorta: The aortic root is normal in size and structure. Venous: The inferior vena cava is normal in size with greater than 50% respiratory variability, suggesting right atrial pressure of 3 mmHg. IAS/Shunts: No atrial level shunt detected by color flow Doppler. Additional Comments: 3D was performed not requiring image post processing on an independent workstation and was indeterminate.  LEFT VENTRICLE PLAX 2D LVIDd:         4.70 cm     Diastology LVIDs:         2.70 cm     LV e' medial:    4.68 cm/s LV PW:         1.55 cm     LV E/e' medial:  9.4 LV IVS:        1.55 cm     LV e' lateral:   6.53 cm/s LVOT diam:     2.30 cm     LV E/e' lateral: 6.7 LV SV:  92 LV SV Index:   34 LVOT Area:     4.15 cm LV IVRT:        116 msec  LV Volumes (MOD) LV vol d, MOD A2C: 60.3 ml LV vol d, MOD A4C: 77.2 ml LV vol s, MOD A2C: 8.0 ml LV vol s, MOD A4C: 16.2 ml LV SV MOD A2C:     52.3 ml LV SV MOD A4C:     77.2 ml LV SV MOD BP:      55.8 ml RIGHT VENTRICLE RV Basal diam:  5.00 cm RV S prime:     12.20 cm/s TAPSE (M-mode): 1.8 cm LEFT ATRIUM           Index        RIGHT ATRIUM           Index LA Vol (A4C): 39.8 ml 14.89 ml/m  RA Area:     21.50 cm                                    RA Volume:   57.10 ml  21.36 ml/m  AORTIC VALVE                    PULMONIC VALVE AV Area (Vmax):    4.18 cm     PV Vmax:        1.72 m/s AV Area (Vmean):   3.97 cm     PV Vmean:       107.000 cm/s AV Area (VTI):     3.97 cm     PV VTI:         0.254 m AV Vmax:           141.00 cm/s  PV Peak grad:   11.8 mmHg AV Vmean:          94.400 cm/s  PV Mean grad:   6.0 mmHg AV VTI:            0.231 m      RVOT Peak grad: 5 mmHg AV Peak Grad:      8.0 mmHg AV Mean Grad:      4.0 mmHg LVOT Vmax:         142.00 cm/s LVOT Vmean:        90.300 cm/s LVOT VTI:          0.221 m LVOT/AV VTI ratio: 0.96  AORTA Ao Root diam: 3.10 cm Ao Asc diam:  3.30 cm MITRAL VALVE MV Area (PHT): 3.91 cm    SHUNTS MV Area VTI:   5.07 cm    Systemic VTI:  0.22 m MV Peak grad:  2.3 mmHg    Systemic Diam: 2.30 cm MV Mean grad:  1.0 mmHg    Pulmonic VTI:  0.184 m MV Vmax:       0.76 m/s MV Vmean:      56.9 cm/s MV Decel Time: 194 msec MV E velocity: 43.95 cm/s MV A velocity: 54.75 cm/s MV E/A ratio:  0.80 Shaukat Khan Electronically signed by Denyse Bathe Signature Date/Time: 12/10/2024/12:17:16 PM    Final    CT ANGIO HEAD NECK W WO CM Result Date: 12/08/2024 CLINICAL DATA:  Initial evaluation for acute neuro deficit, stroke. EXAM: CT ANGIOGRAPHY HEAD AND NECK WITH AND WITHOUT CONTRAST TECHNIQUE: Multidetector CT imaging of the head and neck was performed using the standard protocol during bolus administration of intravenous contrast. Multiplanar CT  image reconstructions and MIPs were  obtained to evaluate the vascular anatomy. Carotid stenosis measurements (when applicable) are obtained utilizing NASCET criteria, using the distal internal carotid diameter as the denominator. RADIATION DOSE REDUCTION: This exam was performed according to the departmental dose-optimization program which includes automated exposure control, adjustment of the mA and/or kV according to patient size and/or use of iterative reconstruction technique. CONTRAST:  75mL OMNIPAQUE  IOHEXOL  350 MG/ML SOLN COMPARISON:  Prior exam from 06/24/2021. FINDINGS: CT HEAD FINDINGS Brain: Cerebral volume within normal limits for patient age. No acute intracranial hemorrhage. No acute large vessel territory infarct. No mass lesion, midline shift, or mass effect. Ventricles are normal in size without hydrocephalus. No extra-axial fluid collection. Vascular: No abnormal hyperdense vessel. Skull: Scalp soft tissues demonstrate no acute abnormality. Calvarium intact. Sinuses/Orbits: Right gaze noted. Remote posttraumatic defect at the left lamina papyracea noted. Scattered mucosal thickening about the ethmoidal air cells and maxillary sinuses. No mastoid effusion. CTA NECK FINDINGS Aortic arch: Examination severely limited by habitus, markedly limiting assessment. Aortic arch within normal limits for caliber with standard branch pattern. Aortic atherosclerosis. No visible stenosis about the origin the great vessels. Right carotid system: Right common and internal carotid arteries are patent. Partial medialization into the retropharyngeal space. No visible dissection. Atheromatous change about the right carotid bulb with approximate 50% stenosis, although evaluation is limited by habitus (series 13, image 186). Left carotid system: Left common and internal carotid arteries are grossly patent. Partial medialization into the left retropharyngeal space. No visible dissection. Atheromatous change about the left carotid bulb but with no visible  stenosis. Vertebral arteries: Evaluation of the vertebral arteries is severely limited within the neck. Both vertebral arteries grossly patent. No visible stenosis or dissection. Skeleton: No visible discrete or worrisome osseous lesions. Other neck: No other obvious abnormality. Upper chest: No other visible abnormality. Review of the MIP images confirms the above findings CTA HEAD FINDINGS Anterior circulation: Evaluation of the intracranial circulation limited by habitus. Atheromatous change about the carotid siphons bilaterally, left worse than right. Up to moderate stenosis at the left siphon. No significant stenosis visible about the right siphon. A1 segments patent bilaterally. Normal anterior communicating complex. Both ACAs patent without visible stenosis. 5 mm saccular lesion arising from the distal right ACA, consistent with aneurysm (series 13, image 56). No M1 stenosis or occlusion. No visible proximal MCA branch occlusion. Distal MCA branches grossly perfused and symmetric. Posterior circulation: Both V4 segments grossly patent without visible stenosis or other abnormality. Neither PICA well visualized. Basilar patent without visible stenosis. Superior cerebellar arteries patent bilaterally. Left PCA primarily supplied via the basilar. Right PCA supplied via a hypoplastic right P1 segment and prominent right posterior communicating artery. PCAs grossly patent to their distal aspects without visible stenosis. Venous sinuses: Grossly patent allowing for timing the contrast bolus. Anatomic variants: None significant. Review of the MIP images confirms the above findings IMPRESSION: 1. Markedly limited exam due to habitus. 2. Grossly negative CTA for large vessel occlusion or other emergent finding. 3. Atheromatous change about the right carotid bulb with approximate 50% stenosis, difficult to assess accurately given limitations of this exam. 4. Atheromatous change about the left carotid siphon with up to  moderate stenosis. 5. 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 6. No other acute intracranial abnormality. Aortic Atherosclerosis (ICD10-I70.0). Electronically Signed   By: Morene Hoard M.D.   On: 12/08/2024 21:03        Scheduled Meds:  atenolol   50 mg  Oral BID   furosemide   40 mg Intravenous BID   heparin   5,000 Units Subcutaneous Q8H   losartan   100 mg Oral Daily   nystatin  cream   Topical TID   sodium chloride  flush  3 mL Intravenous Q12H   Continuous Infusions:   LOS: 0 days    Calvin KATHEE Robson, MD [facility]   If 7PM-7AM, please contact night-coverage  12/10/2024, 2:33 PM   "

## 2024-12-10 NOTE — Progress Notes (Signed)
" °   12/10/24 0715  Spiritual Encounters  Type of Visit Initial  Care provided to: Patient  Referral source Other (comment) ([place] Consult)  Reason for visit Advance directives  OnCall Visit Yes   Chaplain visited patient in response to a [place] Consult in the system.  Chaplain took document and explained it to patient.    Rev. Rana M. Nicholaus, M.Div. Chaplain Resident [facility] "

## 2024-12-10 NOTE — Care Management Obs Status (Signed)
 MEDICARE OBSERVATION STATUS NOTIFICATION   Patient Details  Name: [name] MRN: [id number] Date of Birth: [birth year]   Medicare Observation Status Notification Given:   patient sleeping could not wake    [name] 12/10/2024, 12:50 PM

## 2024-12-10 NOTE — Care Management Obs Status (Signed)
 MEDICARE OBSERVATION STATUS NOTIFICATION   Patient Details  Name: [name] MRN: [id number] Date of Birth: 23-Dec-1960   Medicare Observation Status Notification Given:  Yes    [name] 12/10/2024, 1:48 PM

## 2024-12-10 NOTE — Care Management Obs Status (Signed)
 MEDICARE OBSERVATION STATUS NOTIFICATION   Patient Details  Name: [name] MRN: [id number] Date of Birth: October 23, 1961   Medicare Observation Status Notification Given:  Yes    [name] 12/10/2024, 1:44 PM

## 2024-12-11 DIAGNOSIS — Z6841 Body Mass Index (BMI) 40.0 and over, adult: Secondary | ICD-10-CM | POA: Diagnosis not present

## 2024-12-11 DIAGNOSIS — Z23 Encounter for immunization: Secondary | ICD-10-CM | POA: Diagnosis not present

## 2024-12-11 DIAGNOSIS — Z823 Family history of stroke: Secondary | ICD-10-CM | POA: Diagnosis not present

## 2024-12-11 DIAGNOSIS — M79605 Pain in left leg: Secondary | ICD-10-CM | POA: Diagnosis not present

## 2024-12-11 DIAGNOSIS — J9611 Chronic respiratory failure with hypoxia: Secondary | ICD-10-CM | POA: Diagnosis not present

## 2024-12-11 DIAGNOSIS — Z87891 Personal history of nicotine dependence: Secondary | ICD-10-CM | POA: Diagnosis not present

## 2024-12-11 DIAGNOSIS — K219 Gastro-esophageal reflux disease without esophagitis: Secondary | ICD-10-CM | POA: Diagnosis not present

## 2024-12-11 DIAGNOSIS — I5033 Acute on chronic diastolic (congestive) heart failure: Secondary | ICD-10-CM | POA: Diagnosis not present

## 2024-12-11 DIAGNOSIS — Z1152 Encounter for screening for COVID-19: Secondary | ICD-10-CM | POA: Diagnosis not present

## 2024-12-11 DIAGNOSIS — Z9981 Dependence on supplemental oxygen: Secondary | ICD-10-CM | POA: Diagnosis not present

## 2024-12-11 DIAGNOSIS — N390 Urinary tract infection, site not specified: Secondary | ICD-10-CM | POA: Diagnosis not present

## 2024-12-11 DIAGNOSIS — Z7982 Long term (current) use of aspirin: Secondary | ICD-10-CM | POA: Diagnosis not present

## 2024-12-11 DIAGNOSIS — Z8419 Family history of other disorders of kidney and ureter: Secondary | ICD-10-CM | POA: Diagnosis not present

## 2024-12-11 DIAGNOSIS — G4733 Obstructive sleep apnea (adult) (pediatric): Secondary | ICD-10-CM | POA: Diagnosis not present

## 2024-12-11 DIAGNOSIS — N179 Acute kidney failure, unspecified: Secondary | ICD-10-CM | POA: Diagnosis not present

## 2024-12-11 DIAGNOSIS — Z8249 Family history of ischemic heart disease and other diseases of the circulatory system: Secondary | ICD-10-CM | POA: Diagnosis not present

## 2024-12-11 DIAGNOSIS — Z79899 Other long term (current) drug therapy: Secondary | ICD-10-CM | POA: Diagnosis not present

## 2024-12-11 DIAGNOSIS — R001 Bradycardia, unspecified: Secondary | ICD-10-CM | POA: Diagnosis not present

## 2024-12-11 DIAGNOSIS — R0602 Shortness of breath: Secondary | ICD-10-CM | POA: Diagnosis present

## 2024-12-11 DIAGNOSIS — B9620 Unspecified Escherichia coli [E. coli] as the cause of diseases classified elsewhere: Secondary | ICD-10-CM | POA: Diagnosis not present

## 2024-12-11 DIAGNOSIS — J45909 Unspecified asthma, uncomplicated: Secondary | ICD-10-CM | POA: Diagnosis not present

## 2024-12-11 DIAGNOSIS — E785 Hyperlipidemia, unspecified: Secondary | ICD-10-CM | POA: Diagnosis not present

## 2024-12-11 DIAGNOSIS — I959 Hypotension, unspecified: Secondary | ICD-10-CM | POA: Diagnosis not present

## 2024-12-11 DIAGNOSIS — I110 Hypertensive heart disease with heart failure: Secondary | ICD-10-CM | POA: Diagnosis not present

## 2024-12-11 LAB — MAGNESIUM: Magnesium: 2 mg/dL (ref 1.7–2.4)

## 2024-12-11 LAB — BASIC METABOLIC PANEL WITH GFR
Anion gap: 9 (ref 5–15)
BUN: 21 mg/dL (ref 8–23)
CO2: 35 mmol/L — ABNORMAL HIGH (ref 22–32)
Calcium: 9.1 mg/dL (ref 8.9–10.3)
Chloride: 97 mmol/L — ABNORMAL LOW (ref 98–111)
Creatinine, Ser: 1.08 mg/dL — ABNORMAL HIGH (ref 0.44–1.00)
GFR, Estimated: 57 mL/min — ABNORMAL LOW
Glucose, Bld: 89 mg/dL (ref 70–99)
Potassium: 4.3 mmol/L (ref 3.5–5.1)
Sodium: 140 mmol/L (ref 135–145)

## 2024-12-11 LAB — GLUCOSE, CAPILLARY: Glucose-Capillary: 96 mg/dL (ref 70–99)

## 2024-12-11 MED ORDER — POLYETHYLENE GLYCOL 3350 17 G PO PACK
17.0000 g | PACK | Freq: Every day | ORAL | Status: DC
  Administered 2024-12-11 – 2024-12-16 (×6): 17 g via ORAL
  Filled 2024-12-11 (×6): qty 1

## 2024-12-11 MED ORDER — ATENOLOL 25 MG PO TABS
25.0000 mg | ORAL_TABLET | Freq: Two times a day (BID) | ORAL | Status: DC
  Administered 2024-12-11: 25 mg via ORAL
  Filled 2024-12-11 (×2): qty 1

## 2024-12-11 MED ORDER — ALUM & MAG HYDROXIDE-SIMETH 200-200-20 MG/5ML PO SUSP
15.0000 mL | ORAL | Status: DC | PRN
  Administered 2024-12-11: 15 mL via ORAL
  Filled 2024-12-11: qty 30

## 2024-12-11 MED ORDER — LACTULOSE 10 GM/15ML PO SOLN
20.0000 g | Freq: Every day | ORAL | Status: DC | PRN
  Administered 2024-12-12 – 2024-12-16 (×4): 20 g via ORAL
  Filled 2024-12-11 (×4): qty 30

## 2024-12-11 MED ORDER — SENNOSIDES-DOCUSATE SODIUM 8.6-50 MG PO TABS
1.0000 | ORAL_TABLET | Freq: Every day | ORAL | Status: DC
  Administered 2024-12-11 – 2024-12-15 (×4): 1 via ORAL
  Filled 2024-12-11 (×5): qty 1

## 2024-12-11 NOTE — Plan of Care (Signed)
  Problem: Clinical Measurements: Goal: Ability to maintain clinical measurements within normal limits will improve Outcome: Progressing   Problem: Clinical Measurements: Goal: Respiratory complications will improve Outcome: Progressing   Problem: Pain Managment: Goal: General experience of comfort will improve and/or be controlled Outcome: Progressing   Problem: Safety: Goal: Ability to remain free from injury will improve Outcome: Progressing   Problem: Skin Integrity: Goal: Risk for impaired skin integrity will decrease Outcome: Progressing

## 2024-12-11 NOTE — Consult Note (Signed)
 WOC Nurse Consult Note: Reason for Consult: Blisters on left inner thigh, left leg pain, looks like a blister popped, skin tears on the breast folds  Wound type: Linear intertriginous skin damage; under breast, arm skin folds  full thickness and partial thickness ICD-10 CM Codes for Irritant Dermatitis L24A0 - Due to friction or contact with body fluids; unspecified L30.4  - Erythema intertrigo. Also used for abrasion of the hand, chafing of the skin, dermatitis due to sweating and friction, friction dermatitis, friction eczema, and genital/thigh intertrigo.  Left thigh; partial thickness; appears to be intact    Pressure Injury POA: NA Measurement: see nursing flow sheets Wound bed: all areas that are open are clean and pink Drainage (amount, consistency, odor) see nursing flow sheets Periwound: intact  Dressing procedure/placement/frequency: Cleanse wounds under breast with saline, pat dry. Apply single layer of xeroform and top with foam. Change daily due to location  Cleanse affected areas under arm with bathing, apply foam to protect  Ok to apply foam to the thigh wound per the skin care order set     Re consult if needed, will not follow at this time. Thanks  Rucha [employer]inger [employer], RN,CWOCN, CNS, [employer] [phone]

## 2024-12-11 NOTE — Progress Notes (Signed)
 " PROGRESS NOTE    [name]  FMW:[id number] DOB: 1961-02-09 DOA: 12/08/2024 PCP: [name], MD    Brief Narrative:  63 y.o. year old female with medical history of hypertension, hyperlipidemia, HFpEF (EF 60%, G2 DD and 06/2021), OSA and chronic hypoxic respiratory failure on 2 L secondary to above, presenting to the ED with shortness of breath.   Patient reports waking up this morning with significant shortness of breath.  She reports this was not present when she went to sleep last night.  She states she also had dizziness that started after waking up. She states it has been constant without any periods without it. States this is similar to previous episode but more severe. Denies any URI symptoms in the last month.  Denies any fevers or chills.   On arrival to the ED patient was noted to be HDS stable.  Lab work and imaging obtained.  CBC and CMP is unremarkable.  Respiratory panel was negative for COVID, flu, RSV.  proBNP is 214.  Troponin is less than 15.  Chest x-ray shows signs of volume overload with pulmonary vascular congestion.  It is overall poor study given the rotation.  Given patient presentation, [facility] contacted for admission.   Assessment & Plan:   Principal Problem:   Acute exacerbation of CHF (congestive heart failure) (HCC) Active Problems:   Vertigo   Essential hypertension   Obstructive sleep apnea   Chronic heart failure with preserved ejection fraction (HFpEF) (HCC)   Obesity, morbid, BMI 50 or higher (HCC)  Acute Heart Failure Exacerbation Pt with hx of CHF and signs of volume overload on exam and imaging suggestive of CHF exacerbation. Suspect her BNP is normal given her BMI.  She is supposed to be on Lasix  and metolazone  but appears she is not taking these.  Given her last echocardiogram was in 2022.  Repeat echocardiogram shows a supranormal ejection fraction with grade 1 diastolic dysfunction.   Plan: Continue Lasix  40 mg IV twice daily Strict I's  and O's, daily weights, fluid restrict Consider cardiology inpatient consultation versus outpatient follow-up  Vertigo Seen in consultation by physical therapy.  Patient's body habitus precludes MRI however CTA H&N is negative for LVO.  Physical therapy evaluation suggest a peripheral vestibular issue, not BPPV.   Plan:  As needed meclizine   F/u orthostatic vitals when able to obtain  Continue therapy evaluations   Hypertension Continue home losartan  100 mg daily Diuretic as above Resume home atenolol  at 25 mg BID as opposed to 50 mg twice daily given bradycardia   AKI  Possibly due to diuresis.  Will check BMP in the AM and diureses as appropriate.   Hyperlipidemia Not currently on statin Lipid panel reassuring Will not start statin at this time   Morbid obesity BMI is 80.  She will benefit from therapies for weight loss.  These need to be considered on outpatient basis.  Lifestyle modifications and overall benefits of weight loss on her health were discussed  This is a complicating factor in overall care and prognosis   OSA w/ likely OHS  Nightly CPAP. Patient does not wear this at home as she does not like the mask. Would like to see if RT could recommend different mask and this be ordered via DME on discharge.  Patient may benefit from bipap as she has elevated PCO2 as well.   DVT prophylaxis: SQH Code Status: Full Family Communication: None Disposition Plan: Status is: Observation The patient will require care  spanning > 2 midnights and should be moved to inpatient because: Acute decompensated diastolic congestive heart failure IV diuretic   Level of care: Telemetry  Consultants:  None  Procedures:  None  Antimicrobials: None   Subjective: She is out of lasix  for the past for about two months.   Objective: Vitals:   12/10/24 2347 12/11/24 0415 12/11/24 0416 12/11/24 0827  BP: (!) 154/59 133/64  129/60  Pulse: (!) 58 (!) 51  (!) 54  Resp: 17 17  18    Temp: 98.7 F (37.1 C) 97.7 F (36.5 C)  98.4 F (36.9 C)  TempSrc: Oral     SpO2: 100% 97%  100%  Weight:   (!) 192.3 kg   Height:        Intake/Output Summary (Last 24 hours) at 12/11/2024 1056 Last data filed at 12/11/2024 0900 Gross per 24 hour  Intake 243 ml  Output --  Net 243 ml   Filed Weights   12/08/24 1140 12/10/24 0511 12/11/24 0416  Weight: (!) 199.6 kg (!) 190.6 kg (!) 192.3 kg    Examination:   Constitutional: In no distress.  Cardiovascular: Normal rate, regular rhythm. trace lower extremity edema  Pulmonary: Non labored breathing on [place], no wheezing or rales.   Abdominal: Soft. Obese Non distended and non tender Musculoskeletal: Normal range of motion.     Neurological: Alert and oriented to person, place, and time. Non focal  Skin: Skin is warm and dry.    Data Reviewed: I have personally reviewed following labs and imaging studies  CBC: Recent Labs  Lab 12/08/24 1152 12/09/24 0844  WBC 7.9 6.5  NEUTROABS 3.2  --   HGB 12.5 13.2  HCT 39.0 42.7  MCV 99.0 101.7*  PLT 225 213   Basic Metabolic Panel: Recent Labs  Lab 12/08/24 1409 12/09/24 0844 12/10/24 0338 12/11/24 0409  NA 141 139 139 140  K 4.0 3.9 3.9 4.3  CL 101 99 95* 97*  CO2 31 33* 33* 35*  GLUCOSE 113* 89 98 89  BUN 12 11 17 21   CREATININE 0.58 0.68 0.92 1.08*  CALCIUM  9.3 9.4 9.4 9.1  MG 1.8  --  1.6* 2.0   GFR: Estimated Creatinine Clearance: 88.9 mL/min (A) (by C-G formula based on SCr of 1.08 mg/dL (H)). Liver Function Tests: Recent Labs  Lab 12/08/24 1409  AST 19  ALT 17  ALKPHOS 78  BILITOT 0.3  PROT 6.9  ALBUMIN  3.7   No results for input(s): LIPASE, AMYLASE in the last 168 hours. No results for input(s): AMMONIA in the last 168 hours. Coagulation Profile: No results for input(s): INR, PROTIME in the last 168 hours. Cardiac Enzymes: No results for input(s): CKTOTAL, CKMB, CKMBINDEX, TROPONINI in the last 168 hours. BNP (last 3  results) Recent Labs    12/08/24 1409  PROBNP 214.0   HbA1C: No results for input(s): HGBA1C in the last 72 hours. CBG: Recent Labs  Lab 12/10/24 0807 12/11/24 0823  GLUCAP 101* 96   Lipid Profile: Recent Labs    12/10/24 0338  CHOL 150  HDL 47  LDLCALC 88  TRIG 76  CHOLHDL 3.2   Thyroid  Function Tests: No results for input(s): TSH, T4TOTAL, FREET4, T3FREE, THYROIDAB in the last 72 hours. Anemia Panel: No results for input(s): VITAMINB12, FOLATE, FERRITIN, TIBC, IRON, RETICCTPCT in the last 72 hours. Sepsis Labs: No results for input(s): PROCALCITON, LATICACIDVEN in the last 168 hours.  Recent Results (from the past 240 hours)  Resp panel by  RT-PCR (RSV, Flu A&B, Covid) Anterior Nasal Swab     Status: None   Collection Time: 12/08/24 11:52 AM   Specimen: Anterior Nasal Swab  Result Value Ref Range Status   SARS Coronavirus 2 by RT PCR NEGATIVE NEGATIVE Final    Comment: (NOTE) SARS-CoV-2 target nucleic acids are NOT DETECTED.  The SARS-CoV-2 RNA is generally detectable in upper respiratory specimens during the acute phase of infection. The lowest concentration of SARS-CoV-2 viral copies this assay can detect is 138 copies/mL. A negative result does not preclude SARS-Cov-2 infection and should not be used as the sole basis for treatment or other patient management decisions. A negative result may occur with  improper specimen collection/handling, submission of specimen other than nasopharyngeal swab, presence of viral mutation(s) within the areas targeted by this assay, and inadequate number of viral copies(<138 copies/mL). A negative result must be combined with clinical observations, patient history, and epidemiological information. The expected result is Negative.  Fact Sheet for Patients:  bloggercourse.com  Fact Sheet for Healthcare Providers:  seriousbroker.it  This test is no  t yet approved or cleared by the [place]  FDA and  has been authorized for detection and/or diagnosis of SARS-CoV-2 by FDA under an Emergency Use Authorization (EUA). This EUA will remain  in effect (meaning this test can be used) for the duration of the COVID-19 declaration under Section 564(b)(1) of the Act, 21 U.S.C.section 360bbb-3(b)(1), unless the authorization is terminated  or revoked sooner.       Influenza A by PCR NEGATIVE NEGATIVE Final   Influenza B by PCR NEGATIVE NEGATIVE Final    Comment: (NOTE) The Xpert Xpress SARS-CoV-2/FLU/RSV plus assay is intended as an aid in the diagnosis of influenza from Nasopharyngeal swab specimens and should not be used as a sole basis for treatment. Nasal washings and aspirates are unacceptable for Xpert Xpress SARS-CoV-2/FLU/RSV testing.  Fact Sheet for Patients: bloggercourse.com  Fact Sheet for Healthcare Providers: seriousbroker.it  This test is not yet approved or cleared by the [place]  FDA and has been authorized for detection and/or diagnosis of SARS-CoV-2 by FDA under an Emergency Use Authorization (EUA). This EUA will remain in effect (meaning this test can be used) for the duration of the COVID-19 declaration under Section 564(b)(1) of the Act, 21 U.S.C. section 360bbb-3(b)(1), unless the authorization is terminated or revoked.     Resp Syncytial Virus by PCR NEGATIVE NEGATIVE Final    Comment: (NOTE) Fact Sheet for Patients: bloggercourse.com  Fact Sheet for Healthcare Providers: seriousbroker.it  This test is not yet approved or cleared by the [place]  FDA and has been authorized for detection and/or diagnosis of SARS-CoV-2 by FDA under an Emergency Use Authorization (EUA). This EUA will remain in effect (meaning this test can be used) for the duration of the COVID-19 declaration under Section  564(b)(1) of the Act, 21 U.S.C. section 360bbb-3(b)(1), unless the authorization is terminated or revoked.  Performed at [facility], [address], [place], [place] [address]          Radiology Studies: ECHOCARDIOGRAM COMPLETE Result Date: 12/10/2024    ECHOCARDIOGRAM REPORT   Patient Name:   MARIENA MEARES. Farewell Date of Exam: 12/09/2024 Medical Rec #:  [id number]     Height:       62.0 in Accession #:    [id number]    Weight:       440.0 lb Date of Birth:  October 17, 1961    BSA:  2.673 m Patient Age:    62 years      BP:           162/147 mmHg Patient Gender: F             HR:           88 bpm. Exam Location:  [facility] Procedure: 2D Echo, Color Doppler, Cardiac Doppler and Intracardiac            Opacification Agent (Both Spectral and Color Flow Doppler were            utilized during procedure). Indications:     CHF-Acute Diastolic I50.31  History:         Patient has prior history of Echocardiogram examinations, most                  recent 06/24/2021. CHF.  Sonographer:     Ashley McNeely-Sloane Referring Phys:  [id number] [facility] Diagnosing Phys: Denyse Bathe IMPRESSIONS  1. Left ventricular ejection fraction, by estimation, is 70 to 75%. The left ventricle has hyperdynamic function. The left ventricle has no regional wall motion abnormalities. Left ventricular diastolic parameters are consistent with Grade I diastolic dysfunction (impaired relaxation).  2. Right ventricular systolic function is normal. The right ventricular size is normal.  3. The mitral valve is normal in structure. Trivial mitral valve regurgitation. No evidence of mitral stenosis.  4. The aortic valve is normal in structure. Aortic valve regurgitation is not visualized. No aortic stenosis is present.  5. The inferior vena cava is normal in size with greater than 50% respiratory variability, suggesting right atrial pressure of 3 mmHg. FINDINGS  Left Ventricle: Left ventricular ejection fraction, by estimation, is 70 to  75%. The left ventricle has hyperdynamic function. The left ventricle has no regional wall motion abnormalities. Strain was performed and the global longitudinal strain is indeterminate. The left ventricular internal cavity size was normal in size. There is borderline left ventricular hypertrophy. Left ventricular diastolic parameters are consistent with Grade I diastolic dysfunction (impaired relaxation). Right Ventricle: The right ventricular size is normal. No increase in right ventricular wall thickness. Right ventricular systolic function is normal. Left Atrium: Left atrial size was normal in size. Right Atrium: Right atrial size was normal in size. Pericardium: There is no evidence of pericardial effusion. Mitral Valve: The mitral valve is normal in structure. Trivial mitral valve regurgitation. No evidence of mitral valve stenosis. MV peak gradient, 2.3 mmHg. The mean mitral valve gradient is 1.0 mmHg. Tricuspid Valve: The tricuspid valve is normal in structure. Tricuspid valve regurgitation is not demonstrated. No evidence of tricuspid stenosis. Aortic Valve: The aortic valve is normal in structure. Aortic valve regurgitation is not visualized. No aortic stenosis is present. Aortic valve mean gradient measures 4.0 mmHg. Aortic valve peak gradient measures 8.0 mmHg. Aortic valve area, by VTI measures 3.97 cm. Pulmonic Valve: The pulmonic valve was normal in structure. Pulmonic valve regurgitation is not visualized. No evidence of pulmonic stenosis. Aorta: The aortic root is normal in size and structure. Venous: The inferior vena cava is normal in size with greater than 50% respiratory variability, suggesting right atrial pressure of 3 mmHg. IAS/Shunts: No atrial level shunt detected by color flow Doppler. Additional Comments: 3D was performed not requiring image post processing on an independent workstation and was indeterminate.  LEFT VENTRICLE PLAX 2D LVIDd:         4.70 cm     Diastology LVIDs:  2.70 cm     LV e' medial:    4.68 cm/s LV PW:         1.55 cm     LV E/e' medial:  9.4 LV IVS:        1.55 cm     LV e' lateral:   6.53 cm/s LVOT diam:     2.30 cm     LV E/e' lateral: 6.7 LV SV:         92 LV SV Index:   34 LVOT Area:     4.15 cm LV IVRT:       116 msec  LV Volumes (MOD) LV vol d, MOD A2C: 60.3 ml LV vol d, MOD A4C: 77.2 ml LV vol s, MOD A2C: 8.0 ml LV vol s, MOD A4C: 16.2 ml LV SV MOD A2C:     52.3 ml LV SV MOD A4C:     77.2 ml LV SV MOD BP:      55.8 ml RIGHT VENTRICLE RV Basal diam:  5.00 cm RV S prime:     12.20 cm/s TAPSE (M-mode): 1.8 cm LEFT ATRIUM           Index        RIGHT ATRIUM           Index LA Vol (A4C): 39.8 ml 14.89 ml/m  RA Area:     21.50 cm                                    RA Volume:   57.10 ml  21.36 ml/m  AORTIC VALVE                    PULMONIC VALVE AV Area (Vmax):    4.18 cm     PV Vmax:        1.72 m/s AV Area (Vmean):   3.97 cm     PV Vmean:       107.000 cm/s AV Area (VTI):     3.97 cm     PV VTI:         0.254 m AV Vmax:           141.00 cm/s  PV Peak grad:   11.8 mmHg AV Vmean:          94.400 cm/s  PV Mean grad:   6.0 mmHg AV VTI:            0.231 m      RVOT Peak grad: 5 mmHg AV Peak Grad:      8.0 mmHg AV Mean Grad:      4.0 mmHg LVOT Vmax:         142.00 cm/s LVOT Vmean:        90.300 cm/s LVOT VTI:          0.221 m LVOT/AV VTI ratio: 0.96  AORTA Ao Root diam: 3.10 cm Ao Asc diam:  3.30 cm MITRAL VALVE MV Area (PHT): 3.91 cm    SHUNTS MV Area VTI:   5.07 cm    Systemic VTI:  0.22 m MV Peak grad:  2.3 mmHg    Systemic Diam: 2.30 cm MV Mean grad:  1.0 mmHg    Pulmonic VTI:  0.184 m MV Vmax:       0.76 m/s MV Vmean:      56.9 cm/s MV Decel Time: 194 msec MV E velocity: 43.95  cm/s MV A velocity: 54.75 cm/s MV E/A ratio:  0.80 Shaukat Khan Electronically signed by Denyse Bathe Signature Date/Time: 12/10/2024/12:17:16 PM    Final         Scheduled Meds:  atenolol   50 mg Oral BID   furosemide   40 mg Intravenous BID   heparin   5,000 Units Subcutaneous  Q8H   losartan   100 mg Oral Daily   nystatin  cream   Topical TID   sodium chloride  flush  3 mL Intravenous Q12H   Continuous Infusions:   LOS: 0 days    Alban Pepper, MD [facility]   If 7PM-7AM, please contact night-coverage  12/11/2024, 10:56 AM   "

## 2024-12-12 DIAGNOSIS — R0602 Shortness of breath: Secondary | ICD-10-CM | POA: Diagnosis not present

## 2024-12-12 DIAGNOSIS — I5033 Acute on chronic diastolic (congestive) heart failure: Secondary | ICD-10-CM | POA: Diagnosis not present

## 2024-12-12 DIAGNOSIS — I110 Hypertensive heart disease with heart failure: Secondary | ICD-10-CM | POA: Diagnosis not present

## 2024-12-12 LAB — CBC
HCT: 37.3 % (ref 36.0–46.0)
Hemoglobin: 11.8 g/dL — ABNORMAL LOW (ref 12.0–15.0)
MCH: 31.9 pg (ref 26.0–34.0)
MCHC: 31.6 g/dL (ref 30.0–36.0)
MCV: 100.8 fL — ABNORMAL HIGH (ref 80.0–100.0)
Platelets: 203 K/uL (ref 150–400)
RBC: 3.7 MIL/uL — ABNORMAL LOW (ref 3.87–5.11)
RDW: 12.3 % (ref 11.5–15.5)
WBC: 10.2 K/uL (ref 4.0–10.5)
nRBC: 0 % (ref 0.0–0.2)

## 2024-12-12 LAB — BASIC METABOLIC PANEL WITH GFR
Anion gap: 11 (ref 5–15)
BUN: 24 mg/dL — ABNORMAL HIGH (ref 8–23)
CO2: 32 mmol/L (ref 22–32)
Calcium: 8.5 mg/dL — ABNORMAL LOW (ref 8.9–10.3)
Chloride: 93 mmol/L — ABNORMAL LOW (ref 98–111)
Creatinine, Ser: 1.14 mg/dL — ABNORMAL HIGH (ref 0.44–1.00)
GFR, Estimated: 53 mL/min — ABNORMAL LOW
Glucose, Bld: 101 mg/dL — ABNORMAL HIGH (ref 70–99)
Potassium: 4.2 mmol/L (ref 3.5–5.1)
Sodium: 136 mmol/L (ref 135–145)

## 2024-12-12 LAB — BLOOD GAS, VENOUS
Acid-Base Excess: 13.3 mmol/L — ABNORMAL HIGH (ref 0.0–2.0)
Bicarbonate: 39.3 mmol/L — ABNORMAL HIGH (ref 20.0–28.0)
O2 Saturation: 92.8 %
Patient temperature: 37
pCO2, Ven: 54 mmHg (ref 44–60)
pH, Ven: 7.47 — ABNORMAL HIGH (ref 7.25–7.43)
pO2, Ven: 64 mmHg — ABNORMAL HIGH (ref 32–45)

## 2024-12-12 LAB — GLUCOSE, CAPILLARY: Glucose-Capillary: 90 mg/dL (ref 70–99)

## 2024-12-12 LAB — MAGNESIUM: Magnesium: 2 mg/dL (ref 1.7–2.4)

## 2024-12-12 MED ORDER — ATENOLOL 25 MG PO TABS
12.5000 mg | ORAL_TABLET | Freq: Two times a day (BID) | ORAL | Status: DC
  Administered 2024-12-12: 12.5 mg via ORAL
  Filled 2024-12-12: qty 0.5

## 2024-12-12 NOTE — Plan of Care (Signed)

## 2024-12-12 NOTE — Progress Notes (Signed)
 Physical Therapy Treatment Patient Details Name: [name]. Vollrath MRN: [id number] DOB: 04-13-1961 Today's Date: 12/12/2024   History of Present Illness Taren Dymek is a 62yoF who comes to [facility] after waking middle of night with dizziness, spinning, imbalance. Pt reoprts symptoms were persistent and are still present upon PT evaluation in ED on 12/09/24. PMH: CHF, lymphedema, OSA, home O2. Chart shows similar presentation to ED in July 2022.    PT Comments  Pt seen for PT tx with pt agreeable, reporting need to use restroom. Pt requires min<>Mod assist for bed mobility with hospital bed features, reports her bed at home is modified to allow her increased ease of movement. Pt is able to transfer bed<>BSC with RW & supervision, using momentum to assist with sit>stand transfers. Pt c/o feeling swimmy headed, nurse made aware & vitals below. Will continue to follow pt acutely to progress mobility as able.    If plan is discharge home, recommend the following: A little help with walking and/or transfers;Assist for transportation;Assistance with cooking/housework   Can travel by [place] (measurements PT) (bariatric)    Recommendations for Other Services       Precautions / Restrictions Precautions Precautions: Fall Restrictions Weight Bearing Restrictions Per Provider Order: No     Mobility  Bed Mobility Overal bed mobility: Needs Assistance Bed Mobility: Supine to Sit     Supine to sit: Min assist, Used rails, HOB elevated (exit R side of bed) Sit to supine: Mod assist (assistance to elevate BLE onto bed)        Transfers Overall transfer level: Needs assistance Equipment used: Rolling walker (2 wheels) Transfers: Sit to/from Stand, Bed to chair/wheelchair/BSC Sit to Stand: Supervision   Step pivot transfers: Contact guard assist, Supervision       General transfer comment: sit>stand from EOB, recliner with  decreased awareness of hand placement as BUE on RW, uses momentum to transfer to standing    Ambulation/Gait                   Stairs             Wheelchair Mobility     Tilt Bed    Modified Rankin (Stroke Patients Only)       Balance Overall balance assessment: Needs assistance Sitting-balance support: Feet supported Sitting balance-Leahy Scale: Good     Standing balance support: Bilateral upper extremity supported, During functional activity, Reliant on assistive device for balance Standing balance-Leahy Scale: Fair                              Hotel Manager: No apparent difficulties  Cognition Arousal: Alert Behavior During Therapy: WFL for tasks assessed/performed (tearful at times 2/2 situation, not feeling well)   PT - Cognitive impairments: No apparent impairments                         Following commands: Intact      Cueing Cueing Techniques: Verbal cues  Exercises      General Comments General comments (skin integrity, edema, etc.): pt on 4L/min throughout session, SpO2 > 90% throughout, BP 124/103 mmHg MAP 109 in R wrist sitting on BSC, pt reports feeling swimmy headed nurse made aware      Pertinent Vitals/Pain Pain Assessment Pain Assessment: No/denies pain    Home Living  Prior Function            PT Goals (current goals can now be found in the care plan section) Acute Rehab PT Goals Patient Stated Goal: be able to DC back to home; resolve vertigo PT Goal Formulation: With patient Time For Goal Achievement: 12/23/24 Potential to Achieve Goals: Good Progress towards PT goals: Progressing toward goals    Frequency    Min 2X/week      PT Plan      Co-evaluation              AM-PAC PT 6 Clicks Mobility   Outcome Measure  Help needed turning from your back to your side while in a flat bed without using bedrails?: A  Little Help needed moving from lying on your back to sitting on the side of a flat bed without using bedrails?: A Little Help needed moving to and from a bed to a chair (including a wheelchair)?: A Little Help needed standing up from a chair using your arms (e.g., wheelchair or bedside chair)?: A Little Help needed to walk in hospital room?: A Lot Help needed climbing 3-5 steps with a railing? : A Lot 6 Click Score: 16    End of Session Equipment Utilized During Treatment: Oxygen  Activity Tolerance: Patient tolerated treatment well (limited 2/2 feeling swimmy headed) Patient left: in bed;with call bell/phone within reach;with bed alarm set;with nursing/sitter in room Nurse Communication:  (BP, c/o feeling swimmy headed) PT Visit Diagnosis: Unsteadiness on feet (R26.81);Other abnormalities of gait and mobility (R26.89);Muscle weakness (generalized) (M62.81)     Time: 8860-8841 PT Time Calculation (min) (ACUTE ONLY): 19 min  Charges:    $Therapeutic Activity: 8-22 mins PT General Charges $$ ACUTE PT VISIT: 1 Visit                     Richerd Pinal, PT, DPT 12/12/2024, 12:06 PM  Richerd CHRISTELLA Pinal 12/12/2024, 12:06 PM

## 2024-12-12 NOTE — Progress Notes (Signed)
 Occupational Therapy Treatment Patient Details Name: [name]. Serpas MRN: [id number] DOB: 02/25/1961 Today's Date: 12/12/2024   History of present illness [name] is a 62yoF who comes to [facility] after waking middle of night with dizziness, spinning, imbalance. Pt reoprts symptoms were persistent and are still present upon PT evaluation in ED on 12/09/24. PMH: CHF, lymphedema, OSA, home O2. Chart shows similar presentation to ED in July 2022.   OT comments  [name] seen for OT treatment on this date. Upon arrival to room pt in bed, agreeable to tx. Pt requires SBA + RW for BSC t/f, MAX A pericare standing. MOD A return to bed, assist for BLE. Pt reports her sister and daughter live nearby and could assist intermittently during the day (recently lost her PCA). Would benefit from w/c for safe d/c home. Pt making progress toward goals, will continue to follow POC. Discharge recommendation remains appropriate.       If plan is discharge home, recommend the following:  A little help with walking and/or transfers;A little help with bathing/dressing/bathroom   Equipment Recommendations  Wheelchair (measurements OT);Wheelchair cushion (measurements OT)    Recommendations for Other Services      Precautions / Restrictions Precautions Precautions: Fall Recall of Precautions/Restrictions: Intact Restrictions Weight Bearing Restrictions Per Provider Order: No       Mobility Bed Mobility Overal bed mobility: Needs Assistance Bed Mobility: Supine to Sit, Sit to Supine     Supine to sit: Supervision, HOB elevated Sit to supine: Mod assist        Transfers Overall transfer level: Needs assistance Equipment used: Rolling walker (2 wheels) Transfers: Sit to/from Stand, Bed to chair/wheelchair/BSC Sit to Stand: Contact guard assist     Step pivot transfers: Contact guard assist           Balance Overall balance assessment: Needs assistance Sitting-balance support: No upper extremity  supported, Feet supported Sitting balance-Leahy Scale: Good     Standing balance support: Bilateral upper extremity supported Standing balance-Leahy Scale: Fair                             ADL either performed or assessed with clinical judgement   ADL Overall ADL's : Needs assistance/impaired                                       General ADL Comments: SBA + RW for BSC t/f, MAX A pericare standing.     Communication Communication Communication: No apparent difficulties   Cognition Arousal: Alert Behavior During Therapy: WFL for tasks assessed/performed Cognition: No apparent impairments                               Following commands: Intact                      Pertinent Vitals/ Pain       Pain Assessment Pain Assessment: Faces Faces Pain Scale: Hurts little more Pain Location: skin tears below breast and on bottom with pericare Pain Descriptors / Indicators: Grimacing, Discomfort Pain Intervention(s): Limited activity within patient's tolerance, Repositioned   Frequency  Min 1X/week        Progress Toward Goals  OT Goals(current goals can now be found in the care plan section)  Progress towards OT goals:  Progressing toward goals  Acute Rehab OT Goals OT Goal Formulation: With patient Time For Goal Achievement: 12/23/24 Potential to Achieve Goals: Good ADL Goals Pt Will Perform Grooming: with modified independence;sitting Pt Will Transfer to Toilet: with modified independence;bedside commode Pt Will Perform Toileting - Clothing Manipulation and hygiene: with modified independence;sit to/from stand  Plan      Co-evaluation                 AM-PAC OT 6 Clicks Daily Activity     Outcome Measure   Help from another person eating meals?: None Help from another person taking care of personal grooming?: None Help from another person toileting, which includes using toliet, bedpan, or urinal?: A  Lot Help from another person bathing (including washing, rinsing, drying)?: A Little Help from another person to put on and taking off regular upper body clothing?: A Little Help from another person to put on and taking off regular lower body clothing?: A Lot 6 Click Score: 18    End of Session Equipment Utilized During Treatment: Oxygen   OT Visit Diagnosis: Unsteadiness on feet (R26.81)   Activity Tolerance Patient limited by fatigue   Patient Left in bed;with call bell/phone within reach;with bed alarm set   Nurse Communication          Time: 9051-8984 OT Time Calculation (min): 27 min  Charges: OT General Charges $OT Visit: 1 Visit OT Treatments $Self Care/Home Management : 23-37 mins  Elston Slot, M.S. OTR/L  12/12/2024, 10:42 AM  ascom [phone]

## 2024-12-12 NOTE — Progress Notes (Signed)
 " PROGRESS NOTE    [name]  FMW:[id number] DOB: July 23, 1961 DOA: 12/08/2024 PCP: [name], MD    Brief Narrative:  63 y.o. year old female with medical history of hypertension, hyperlipidemia, HFpEF (EF 60%, G2 DD and 06/2021), OSA and chronic hypoxic respiratory failure on 2 L secondary to above, presented to the ED with shortness of breath.  Also reports intermittent dizziness, admitted for acute on chronic HFpEF.  Hospital course as below   Assessment & Plan:  Acute on chronic HFpEF Pt with hx of CHF and signs of volume overload on exam and imaging suggestive of CHF exacerbation. Suspect her BNP falsely negative due to elevated BMI She is supposed to be on Lasix  and metolazone  but appears she is not taking these.  Given her last echocardiogram was in 2022.   Repeat echocardiogram shows a supranormal ejection fraction with grade 1 diastolic dysfunction.  Hold IV lasix  due to elevated Cr Strict I's and O's, daily weights, fluid restrict Cardiology outpatient follow up  Chronic hypoxic respiratory failure On 2 to 3 L  at home  OSA w/ likely OHS  Nightly CPAP. Patient does not wear this at home as she does not like the mask. Would like to see if RT could recommend different mask and this be ordered via DME on discharge.  Patient may benefit from bipap as she has elevated PCO2 as well Sleep clinic follow up outpatient  Vertigo Seen in consultation by physical therapy.  Patient's body habitus precludes MRI, however CTA H&N is negative for LVO.  Physical therapy evaluation suggest a peripheral vestibular issue, not BPPV.   As needed meclizine   F/u orthostatic vitals when able to obtain  Continue therapy evaluations   Hypertension Continue home losartan  100 mg daily Hold diuresis Hold atenolol  due to bradycardia  AKI  Possibly due to diuresis.  Cr increased 1.14 Hold diuresis Monitor Cr  Hyperlipidemia Not currently on statin Lipid panel reassuring Will not  start statin at this time   Morbid obesity BMI is 80.  She will benefit from therapies for weight loss.  These need to be considered on outpatient basis.  Lifestyle modifications and overall benefits of weight loss on her health were discussed  This is a complicating factor in overall care and prognosis   PT/OT rec Home PT/OT  DVT prophylaxis: SQH Code Status: Full Family Communication: None Disposition Plan: Status is: Observation The patient will require care spanning > 2 midnights and should be moved to inpatient because: Acute decompensated diastolic congestive heart failure IV diuretic   Level of care: Telemetry  Consultants:  None  Procedures:  None  Antimicrobials: None   Subjective: Patient reports feeling slightly better today, dizziness also improving. Will hold diuresis due to AKI, also atenolol  discontinued due to persistent bradycardia Encouraged to get out of bed to chair  Objective: Vitals:   12/12/24 0110 12/12/24 0334 12/12/24 0505 12/12/24 0833  BP:  106/69  134/62  Pulse: 61 (!) 56  (!) 56  Resp:  20  17  Temp:  99.7 F (37.6 C)  98.3 F (36.8 C)  TempSrc:    Oral  SpO2: 100% 93%  100%  Weight:   (!) 190.7 kg   Height:        Intake/Output Summary (Last 24 hours) at 12/12/2024 0931 Last data filed at 12/12/2024 0926 Gross per 24 hour  Intake 13 ml  Output 1400 ml  Net -1387 ml   Filed Weights   12/10/24 0511  12/11/24 0416 12/12/24 0505  Weight: (!) 190.6 kg (!) 192.3 kg (!) 190.7 kg    Examination:   Constitutional: In no distress, morbidly obese Cardiovascular: Normal rate, regular rhythm. trace lower extremity edema  Pulmonary: Non labored breathing on [place], no wheezing or rales.   Abdominal: Soft. Obese Non distended and non tender Musculoskeletal: Normal range of motion.     Neurological: Alert and oriented to person, place, and time. Non focal  Skin: Skin is warm and dry.    Data Reviewed: I have personally reviewed  following labs and imaging studies  CBC: Recent Labs  Lab 12/08/24 1152 12/09/24 0844 12/12/24 0409  WBC 7.9 6.5 10.2  NEUTROABS 3.2  --   --   HGB 12.5 13.2 11.8*  HCT 39.0 42.7 37.3  MCV 99.0 101.7* 100.8*  PLT 225 213 203   Basic Metabolic Panel: Recent Labs  Lab 12/08/24 1409 12/09/24 0844 12/10/24 0338 12/11/24 0409 12/12/24 0409  NA 141 139 139 140 136  K 4.0 3.9 3.9 4.3 4.2  CL 101 99 95* 97* 93*  CO2 31 33* 33* 35* 32  GLUCOSE 113* 89 98 89 101*  BUN 12 11 17 21  24*  CREATININE 0.58 0.68 0.92 1.08* 1.14*  CALCIUM  9.3 9.4 9.4 9.1 8.5*  MG 1.8  --  1.6* 2.0 2.0   GFR: Estimated Creatinine Clearance: 83.7 mL/min (A) (by C-G formula based on SCr of 1.14 mg/dL (H)). Liver Function Tests: Recent Labs  Lab 12/08/24 1409  AST 19  ALT 17  ALKPHOS 78  BILITOT 0.3  PROT 6.9  ALBUMIN  3.7   No results for input(s): LIPASE, AMYLASE in the last 168 hours. No results for input(s): AMMONIA in the last 168 hours. Coagulation Profile: No results for input(s): INR, PROTIME in the last 168 hours. Cardiac Enzymes: No results for input(s): CKTOTAL, CKMB, CKMBINDEX, TROPONINI in the last 168 hours. BNP (last 3 results) Recent Labs    12/08/24 1409  PROBNP 214.0   HbA1C: No results for input(s): HGBA1C in the last 72 hours. CBG: Recent Labs  Lab 12/10/24 0807 12/11/24 0823 12/12/24 0832  GLUCAP 101* 96 90   Lipid Profile: Recent Labs    12/10/24 0338  CHOL 150  HDL 47  LDLCALC 88  TRIG 76  CHOLHDL 3.2   Thyroid  Function Tests: No results for input(s): TSH, T4TOTAL, FREET4, T3FREE, THYROIDAB in the last 72 hours. Anemia Panel: No results for input(s): VITAMINB12, FOLATE, FERRITIN, TIBC, IRON, RETICCTPCT in the last 72 hours. Sepsis Labs: No results for input(s): PROCALCITON, LATICACIDVEN in the last 168 hours.  Recent Results (from the past 240 hours)  Resp panel by RT-PCR (RSV, Flu A&B, Covid) Anterior  Nasal Swab     Status: None   Collection Time: 12/08/24 11:52 AM   Specimen: Anterior Nasal Swab  Result Value Ref Range Status   SARS Coronavirus 2 by RT PCR NEGATIVE NEGATIVE Final    Comment: (NOTE) SARS-CoV-2 target nucleic acids are NOT DETECTED.  The SARS-CoV-2 RNA is generally detectable in upper respiratory specimens during the acute phase of infection. The lowest concentration of SARS-CoV-2 viral copies this assay can detect is 138 copies/mL. A negative result does not preclude SARS-Cov-2 infection and should not be used as the sole basis for treatment or other patient management decisions. A negative result may occur with  improper specimen collection/handling, submission of specimen other than nasopharyngeal swab, presence of viral mutation(s) within the areas targeted by this assay, and inadequate number of  viral copies(<138 copies/mL). A negative result must be combined with clinical observations, patient history, and epidemiological information. The expected result is Negative.  Fact Sheet for Patients:  bloggercourse.com  Fact Sheet for Healthcare Providers:  seriousbroker.it  This test is no t yet approved or cleared by the [place]  FDA and  has been authorized for detection and/or diagnosis of SARS-CoV-2 by FDA under an Emergency Use Authorization (EUA). This EUA will remain  in effect (meaning this test can be used) for the duration of the COVID-19 declaration under Section 564(b)(1) of the Act, 21 U.S.C.section 360bbb-3(b)(1), unless the authorization is terminated  or revoked sooner.       Influenza A by PCR NEGATIVE NEGATIVE Final   Influenza B by PCR NEGATIVE NEGATIVE Final    Comment: (NOTE) The Xpert Xpress SARS-CoV-2/FLU/RSV plus assay is intended as an aid in the diagnosis of influenza from Nasopharyngeal swab specimens and should not be used as a sole basis for treatment. Nasal washings  and aspirates are unacceptable for Xpert Xpress SARS-CoV-2/FLU/RSV testing.  Fact Sheet for Patients: bloggercourse.com  Fact Sheet for Healthcare Providers: seriousbroker.it  This test is not yet approved or cleared by the [place]  FDA and has been authorized for detection and/or diagnosis of SARS-CoV-2 by FDA under an Emergency Use Authorization (EUA). This EUA will remain in effect (meaning this test can be used) for the duration of the COVID-19 declaration under Section 564(b)(1) of the Act, 21 U.S.C. section 360bbb-3(b)(1), unless the authorization is terminated or revoked.     Resp Syncytial Virus by PCR NEGATIVE NEGATIVE Final    Comment: (NOTE) Fact Sheet for Patients: bloggercourse.com  Fact Sheet for Healthcare Providers: seriousbroker.it  This test is not yet approved or cleared by the [place]  FDA and has been authorized for detection and/or diagnosis of SARS-CoV-2 by FDA under an Emergency Use Authorization (EUA). This EUA will remain in effect (meaning this test can be used) for the duration of the COVID-19 declaration under Section 564(b)(1) of the Act, 21 U.S.C. section 360bbb-3(b)(1), unless the authorization is terminated or revoked.  Performed at [facility], [address] [place], [place] [address]          Radiology Studies: No results found.       Scheduled Meds:  atenolol   25 mg Oral BID   furosemide   40 mg Intravenous BID   heparin   5,000 Units Subcutaneous Q8H   losartan   100 mg Oral Daily   nystatin  cream   Topical TID   polyethylene glycol  17 g Oral Daily   senna-docusate  1 tablet Oral QHS   sodium chloride  flush  3 mL Intravenous Q12H   Continuous Infusions:  I personally spent a total of 56 minutes in the care of the patient today including getting/reviewing separately obtained history, performing a  medically appropriate exam/evaluation, placing orders, documenting clinical information in the EHR, independently interpreting results, and coordinating care.    LOS: 1 day    Laree Lock, MD [facility]   If 7PM-7AM, please contact night-coverage  12/12/2024, 9:31 AM   "

## 2024-12-13 ENCOUNTER — Inpatient Hospital Stay

## 2024-12-13 DIAGNOSIS — R0602 Shortness of breath: Secondary | ICD-10-CM | POA: Diagnosis not present

## 2024-12-13 DIAGNOSIS — I110 Hypertensive heart disease with heart failure: Secondary | ICD-10-CM | POA: Diagnosis not present

## 2024-12-13 DIAGNOSIS — I5033 Acute on chronic diastolic (congestive) heart failure: Secondary | ICD-10-CM | POA: Diagnosis not present

## 2024-12-13 LAB — BASIC METABOLIC PANEL WITH GFR
Anion gap: 9 (ref 5–15)
BUN: 31 mg/dL — ABNORMAL HIGH (ref 8–23)
CO2: 34 mmol/L — ABNORMAL HIGH (ref 22–32)
Calcium: 9.3 mg/dL (ref 8.9–10.3)
Chloride: 93 mmol/L — ABNORMAL LOW (ref 98–111)
Creatinine, Ser: 1.65 mg/dL — ABNORMAL HIGH (ref 0.44–1.00)
GFR, Estimated: 34 mL/min — ABNORMAL LOW
Glucose, Bld: 101 mg/dL — ABNORMAL HIGH (ref 70–99)
Potassium: 4.2 mmol/L (ref 3.5–5.1)
Sodium: 137 mmol/L (ref 135–145)

## 2024-12-13 LAB — CBC
HCT: 37.2 % (ref 36.0–46.0)
Hemoglobin: 11.6 g/dL — ABNORMAL LOW (ref 12.0–15.0)
MCH: 31.3 pg (ref 26.0–34.0)
MCHC: 31.2 g/dL (ref 30.0–36.0)
MCV: 100.3 fL — ABNORMAL HIGH (ref 80.0–100.0)
Platelets: 223 K/uL (ref 150–400)
RBC: 3.71 MIL/uL — ABNORMAL LOW (ref 3.87–5.11)
RDW: 12.5 % (ref 11.5–15.5)
WBC: 10.2 K/uL (ref 4.0–10.5)
nRBC: 0 % (ref 0.0–0.2)

## 2024-12-13 LAB — URINALYSIS, ROUTINE W REFLEX MICROSCOPIC
Bilirubin Urine: NEGATIVE
Glucose, UA: NEGATIVE mg/dL
Ketones, ur: NEGATIVE mg/dL
Nitrite: NEGATIVE
Protein, ur: >=300 mg/dL — AB
RBC / HPF: >50 RBC/hpf (ref 0–5)
Specific Gravity, Urine: 1.023 (ref 1.005–1.030)
WBC, UA: >50 WBC/hpf (ref 0–5)
pH: 6 (ref 5.0–8.0)

## 2024-12-13 LAB — SODIUM, URINE, RANDOM: Sodium, Ur: <30 mmol/L

## 2024-12-13 LAB — CREATININE, URINE, RANDOM: Creatinine, Urine: 411 mg/dL

## 2024-12-13 LAB — GLUCOSE, CAPILLARY: Glucose-Capillary: 127 mg/dL — ABNORMAL HIGH (ref 70–99)

## 2024-12-13 MED ORDER — ALBUMIN HUMAN 25 % IV SOLN
25.0000 g | Freq: Once | INTRAVENOUS | Status: AC
  Administered 2024-12-13: 25 g via INTRAVENOUS
  Filled 2024-12-13: qty 100

## 2024-12-13 NOTE — Progress Notes (Signed)
 " PROGRESS NOTE    [name]  FMW:[id number] DOB: 10/15/1961 DOA: 12/08/2024 PCP: [name], MD    Brief Narrative:  63 y.o. year old female with medical history of hypertension, hyperlipidemia, HFpEF (EF 60%, G2 DD and 06/2021), OSA and chronic hypoxic respiratory failure on 2 L secondary to above, presented to the ED with shortness of breath.  Also reports intermittent dizziness, admitted for acute on chronic HFpEF.  Hospital course as below    Acute on chronic HFpEF Pt with hx of CHF and signs of volume overload on exam and imaging suggestive of CHF exacerbation. Suspect her BNP falsely negative due to elevated BMI She is supposed to be on Lasix  80mg  daily and metolazone  2.5 thrice weekly but appears she is not taking these Repeat echocardiogram shows a supranormal ejection fraction with grade 1 diastolic dysfunction.  Hold IV lasix  due to AKI IV Albumin  due to hypotension Strict I's and O's, daily weights, fluid restrict  Chronic hypoxic respiratory failure On 2 to 3 L [place] at home  OSA w/ likely OHS  Nightly CPAP. Patient does not wear this at home as she does not like the mask. Would like to see if RT could recommend different mask and this be ordered via DME on discharge.  Patient may benefit from bipap as she has elevated PCO2 as well Sleep clinic follow up outpatient  Vertigo - resolved Seen in consultation by physical therapy.  Patient's body habitus precludes MRI, however CTA H&N is negative for LVO.  Physical therapy evaluation suggest a peripheral vestibular issue, not BPPV.   As needed meclizine   Orthostatic vitals negative Continue therapy evaluations   Hypertension Hold diuresis Hold Losartan , atenolol  due to bradycardia/hypotension  AKI  Possibly due to diuresis.  Cr increased 1.65, Hold diuresis Monitor UO, bladder scan q6 Pending urine Na, urine urea , urine cr US  Kidneys Monitor Cr  Hyperlipidemia Not currently on statin Lipid panel  reassuring   Morbid obesity BMI is 80.  She will benefit from therapies for weight loss.  These need to be considered on outpatient basis.  Lifestyle modifications and overall benefits of weight loss on her health were discussed  This is a complicating factor in overall care and prognosis   Home PT/OT/HHA at discharge  DVT prophylaxis: SQH Code Status: Full Family Communication: None Disposition Plan: Status is: Observation The patient will require care spanning > 2 midnights and should be moved to inpatient because: Acute decompensated diastolic congestive heart failure IV diuretic   Level of care: Telemetry  Consultants:  None  Procedures:  None  Antimicrobials: None   Subjective: Patient reports feeling slightly better today, dizziness resolved Will hold diuresis due to AKI, also atenolol  discontinued due to persistent bradycardia Encouraged to get out of bed to chair  Objective: Vitals:   12/13/24 1124 12/13/24 1620 12/13/24 1839 12/13/24 1955  BP: 115/86 (!) 83/43 (!) 140/40 (!) 114/49  Pulse: 74 65 65 64  Resp:  15  18  Temp:  98.2 F (36.8 C)  99.1 F (37.3 C)  TempSrc:      SpO2:  100%  100%  Weight:      Height:        Intake/Output Summary (Last 24 hours) at 12/13/2024 2104 Last data filed at 12/13/2024 1550 Gross per 24 hour  Intake 1160 ml  Output 230 ml  Net 930 ml   Filed Weights   12/11/24 0416 12/12/24 0505 12/13/24 0508  Weight: (!) 192.3 kg (!) 190.7  kg (!) 190.6 kg    Examination:   Constitutional: In no distress, morbidly obese Cardiovascular: Normal rate, regular rhythm. trace lower extremity edema  Pulmonary: Non labored breathing on [place], no wheezing or rales.   Abdominal: Soft. Obese Non distended and non tender Musculoskeletal: Normal range of motion.     Neurological: Alert and oriented to person, place, and time. Non focal  Skin: Skin is warm and dry.    Data Reviewed: I have personally reviewed following labs and imaging  studies  CBC: Recent Labs  Lab 12/08/24 1152 12/09/24 0844 12/12/24 0409 12/13/24 0345  WBC 7.9 6.5 10.2 10.2  NEUTROABS 3.2  --   --   --   HGB 12.5 13.2 11.8* 11.6*  HCT 39.0 42.7 37.3 37.2  MCV 99.0 101.7* 100.8* 100.3*  PLT 225 213 203 223   Basic Metabolic Panel: Recent Labs  Lab 12/08/24 1409 12/09/24 0844 12/10/24 0338 12/11/24 0409 12/12/24 0409 12/13/24 0345  NA 141 139 139 140 136 137  K 4.0 3.9 3.9 4.3 4.2 4.2  CL 101 99 95* 97* 93* 93*  CO2 31 33* 33* 35* 32 34*  GLUCOSE 113* 89 98 89 101* 101*  BUN 12 11 17 21  24* 31*  CREATININE 0.58 0.68 0.92 1.08* 1.14* 1.65*  CALCIUM  9.3 9.4 9.4 9.1 8.5* 9.3  MG 1.8  --  1.6* 2.0 2.0  --    GFR: Estimated Creatinine Clearance: 57.8 mL/min (A) (by C-G formula based on SCr of 1.65 mg/dL (H)). Liver Function Tests: Recent Labs  Lab 12/08/24 1409  AST 19  ALT 17  ALKPHOS 78  BILITOT 0.3  PROT 6.9  ALBUMIN  3.7   No results for input(s): LIPASE, AMYLASE in the last 168 hours. No results for input(s): AMMONIA in the last 168 hours. Coagulation Profile: No results for input(s): INR, PROTIME in the last 168 hours. Cardiac Enzymes: No results for input(s): CKTOTAL, CKMB, CKMBINDEX, TROPONINI in the last 168 hours. BNP (last 3 results) Recent Labs    12/08/24 1409  PROBNP 214.0   HbA1C: No results for input(s): HGBA1C in the last 72 hours. CBG: Recent Labs  Lab 12/10/24 0807 12/11/24 0823 12/12/24 0832 12/13/24 0818  GLUCAP 101* 96 90 127*   Lipid Profile: No results for input(s): CHOL, HDL, LDLCALC, TRIG, CHOLHDL, LDLDIRECT in the last 72 hours.  Thyroid  Function Tests: No results for input(s): TSH, T4TOTAL, FREET4, T3FREE, THYROIDAB in the last 72 hours. Anemia Panel: No results for input(s): VITAMINB12, FOLATE, FERRITIN, TIBC, IRON, RETICCTPCT in the last 72 hours. Sepsis Labs: No results for input(s): PROCALCITON, LATICACIDVEN in the last  168 hours.  Recent Results (from the past 240 hours)  Resp panel by RT-PCR (RSV, Flu A&B, Covid) Anterior Nasal Swab     Status: None   Collection Time: 12/08/24 11:52 AM   Specimen: Anterior Nasal Swab  Result Value Ref Range Status   SARS Coronavirus 2 by RT PCR NEGATIVE NEGATIVE Final    Comment: (NOTE) SARS-CoV-2 target nucleic acids are NOT DETECTED.  The SARS-CoV-2 RNA is generally detectable in upper respiratory specimens during the acute phase of infection. The lowest concentration of SARS-CoV-2 viral copies this assay can detect is 138 copies/mL. A negative result does not preclude SARS-Cov-2 infection and should not be used as the sole basis for treatment or other patient management decisions. A negative result may occur with  improper specimen collection/handling, submission of specimen other than nasopharyngeal swab, presence of viral mutation(s) within the areas targeted  by this assay, and inadequate number of viral copies(<138 copies/mL). A negative result must be combined with clinical observations, patient history, and epidemiological information. The expected result is Negative.  Fact Sheet for Patients:  bloggercourse.com  Fact Sheet for Healthcare Providers:  seriousbroker.it  This test is no t yet approved or cleared by the [place]  FDA and  has been authorized for detection and/or diagnosis of SARS-CoV-2 by FDA under an Emergency Use Authorization (EUA). This EUA will remain  in effect (meaning this test can be used) for the duration of the COVID-19 declaration under Section 564(b)(1) of the Act, 21 U.S.C.section 360bbb-3(b)(1), unless the authorization is terminated  or revoked sooner.       Influenza A by PCR NEGATIVE NEGATIVE Final   Influenza B by PCR NEGATIVE NEGATIVE Final    Comment: (NOTE) The Xpert Xpress SARS-CoV-2/FLU/RSV plus assay is intended as an aid in the diagnosis of influenza from  Nasopharyngeal swab specimens and should not be used as a sole basis for treatment. Nasal washings and aspirates are unacceptable for Xpert Xpress SARS-CoV-2/FLU/RSV testing.  Fact Sheet for Patients: bloggercourse.com  Fact Sheet for Healthcare Providers: seriousbroker.it  This test is not yet approved or cleared by the [place]  FDA and has been authorized for detection and/or diagnosis of SARS-CoV-2 by FDA under an Emergency Use Authorization (EUA). This EUA will remain in effect (meaning this test can be used) for the duration of the COVID-19 declaration under Section 564(b)(1) of the Act, 21 U.S.C. section 360bbb-3(b)(1), unless the authorization is terminated or revoked.     Resp Syncytial Virus by PCR NEGATIVE NEGATIVE Final    Comment: (NOTE) Fact Sheet for Patients: bloggercourse.com  Fact Sheet for Healthcare Providers: seriousbroker.it  This test is not yet approved or cleared by the [place]  FDA and has been authorized for detection and/or diagnosis of SARS-CoV-2 by FDA under an Emergency Use Authorization (EUA). This EUA will remain in effect (meaning this test can be used) for the duration of the COVID-19 declaration under Section 564(b)(1) of the Act, 21 U.S.C. section 360bbb-3(b)(1), unless the authorization is terminated or revoked.  Performed at [facility], [address] [place], [place] [address]          Radiology Studies: No results found.       Scheduled Meds:  heparin   5,000 Units Subcutaneous Q8H   nystatin  cream   Topical TID   polyethylene glycol  17 g Oral Daily   senna-docusate  1 tablet Oral QHS   sodium chloride  flush  3 mL Intravenous Q12H   Continuous Infusions:  I personally spent a total of 56 minutes in the care of the patient today including getting/reviewing separately obtained history, performing a  medically appropriate exam/evaluation, placing orders, documenting clinical information in the EHR, independently interpreting results, and coordinating care.    LOS: 2 days    Laree Lock, MD [facility]   If 7PM-7AM, please contact night-coverage  12/13/2024, 9:04 PM   "

## 2024-12-13 NOTE — TOC Initial Note (Addendum)
 Transition of Care (TOC) - Initial/Assessment Note    Patient Details  Name: [name]. Bashor MRN: [id number] Date of Birth: September 28, 1961  Transition of Care [facility]) CM/SW Contact:    Shasta DELENA Daring, RN Phone Number: 12/13/2024, 11:58 AM  Clinical Narrative:                 RNCM introduced self to patient and explained role. Patient lives alone in an apartment. Identified transportation as a barrier for her. Will add resources to her AVS. Does not know how she will get home at discharge. Requests ambulance transport. DME in the home includes walker, bedside commode. Patient is requesting a wheelchair.  Will follow for [facility] recommendations.    1:41 PM Initiated [facility] service search  4:02 PM Patient accepted [facility] service offer from [place]. Notified agency rep and linked in chart.  Expected Discharge Plan: Home w Home Health Services Barriers to Discharge: Continued Medical Work up   Patient Goals and CMS Choice Patient states their goals for this hospitalization and ongoing recovery are:: Get better.          Expected Discharge Plan and Services   Discharge Planning Services: CM Consult   Living arrangements for the past 2 months: Apartment                                      Prior Living Arrangements/Services Living arrangements for the past 2 months: Apartment Lives with:: Self Patient language and need for interpreter reviewed:: Yes Do you feel safe going back to the place where you live?: Yes      Need for Family Participation in Patient Care: Yes (Comment) Care giver support system in place?: Yes (comment) Current home services: DME Criminal Activity/Legal Involvement Pertinent to Current Situation/Hospitalization: No - Comment as needed  Activities of Daily Living   ADL Screening (condition at time of admission) Independently performs ADLs?: No Does the patient have a NEW difficulty with bathing/dressing/toileting/self-feeding that is expected to last >3 days?:  No Does the patient have a NEW difficulty with getting in/out of bed, walking, or climbing stairs that is expected to last >3 days?: No Does the patient have a NEW difficulty with communication that is expected to last >3 days?: No Is the patient deaf or have difficulty hearing?: No Does the patient have difficulty seeing, even when wearing glasses/contacts?: No Does the patient have difficulty concentrating, remembering, or making decisions?: No  Permission Sought/Granted Permission sought to share information with : Case Manager Permission granted to share information with : Yes, Verbal Permission Granted              Emotional Assessment Appearance:: Appears stated age Attitude/Demeanor/Rapport: Engaged, Gracious Affect (typically observed): Appropriate Orientation: : Oriented to Place, Oriented to Self, Oriented to  Time, Oriented to Situation Alcohol  / Substance Use: Not Applicable Psych Involvement: No (comment)  Admission diagnosis:  Shortness of breath [R06.02] Acute exacerbation of CHF (congestive heart failure) (HCC) [I50.9] Patient Active Problem List   Diagnosis Date Noted   Acute exacerbation of CHF (congestive heart failure) (HCC) 12/08/2024   Vertigo 12/08/2024   Obesity, morbid, BMI 50 or higher (HCC) 10/04/2021   CVA (cerebral vascular accident) (HCC) 06/23/2021   Macrocytosis 12/07/2020   Erythrocytosis 12/07/2020   Depression 11/12/2017   Chronic heart failure with preserved ejection fraction (HFpEF) (HCC) 07/22/2017   Chest pain of uncertain etiology 07/22/2017   Chronic diastolic  heart failure (HCC) 11/30/2015   Essential hypertension 11/30/2015   Obstructive sleep apnea 11/30/2015   Lymphedema of both lower extremities 11/30/2015   Cellulitis 11/01/2015   PCP:  [name], MD Pharmacy:   [facility] [phone] - [place], [place] - [address] [place] [address] Phone: [phone] Fax: [phone]     Social Drivers of Health  (SDOH) Social History: SDOH Screenings   Food Insecurity: Food Insecurity Present (12/09/2024)  Housing: Low Risk (12/09/2024)  Transportation Needs: Unmet Transportation Needs (12/09/2024)  Utilities: Not At Risk (12/09/2024)  Financial Resource Strain: Patient Declined (06/10/2023)   Received from [facility]  Social Connections: Socially Isolated (12/09/2024)  Tobacco Use: Medium Risk (06/11/2023)   Received from [facility]   SDOH Interventions:     Readmission Risk Interventions     No data to display

## 2024-12-13 NOTE — Discharge Instructions (Signed)

## 2024-12-13 NOTE — Progress Notes (Signed)
 Advised Dr. Jerelene of skin sores noted and placed in chart on 12-12-24

## 2024-12-13 NOTE — Progress Notes (Signed)
 Physical Therapy Treatment Patient Details Name: [name] MRN: [id number] DOB: [birth year] Today's Date: 12/13/2024   History of Present Illness [name] is a 62yoF who comes to [facility] after waking middle of night with dizziness, spinning, imbalance. Pt reoprts symptoms were persistent and are still present upon PT evaluation in ED on 12/09/24. PMH: CHF, lymphedema, OSA, home O2. Chart shows similar presentation to ED in July 2022.    PT Comments  Pt in bed. Stated she was up early but would like to walk.  Inc time to get to EOB with rails but no assist needed.  Rocks to stand to [employer] and is able to march in place with good confidence before walking to/from door with supervision and opts to remain up in chair.  Fatigued with effort but stated she feels comfortable with mobility and discharge home when stable.  Stated she lives alone but has good family support.  Per chart she would like a wheelchair which is reasonable given her limited activity tolerance and gait distances.  She will need bariatric wheelchair.    Patient suffers from obesity and decreased activity tolerance which impairs his/her ability to perform daily activities like toileting, feeding, dressing, grooming, bathing in the home. A cane, walker, crutch will not resolve the patient's issue with performing activities of daily living. A lightweight wheelchair and cushion is required/recommended and will allow patient to safely perform daily activities.   Patient can safely propel the wheelchair in the home or has a caregiver who can provide assistance.      If plan is discharge home, recommend the following: A little help with walking and/or transfers;Assist for transportation;Assistance with cooking/housework   Can travel by Doctor, Hospital cushion (measurements PT);Wheelchair (measurements PT)    Recommendations for Other Services       Precautions / Restrictions  Precautions Precautions: Fall Recall of Precautions/Restrictions: Intact Restrictions Weight Bearing Restrictions Per Provider Order: No     Mobility  Bed Mobility Overal bed mobility: Needs Assistance Bed Mobility: Supine to Sit     Supine to sit: Contact guard, Used rails     General bed mobility comments: inc time but does on her own.  occasionally sleeps in recliner at home Patient Response: Cooperative  Transfers Overall transfer level: Needs assistance Equipment used: Rolling walker (2 wheels) Transfers: Sit to/from Stand Sit to Stand: Supervision           General transfer comment: rocks to stand but does well.    Ambulation/Gait Ambulation/Gait assistance: Supervision Gait Distance (Feet): 25 Feet Assistive device: Rolling walker (2 wheels) Gait Pattern/deviations: Step-through pattern, Decreased step length - right, Decreased step length - left, Wide base of support Gait velocity: dec     General Gait Details: steady to door and back   Stairs             Wheelchair Mobility     Tilt Bed Tilt Bed Patient Response: Cooperative  Modified Rankin (Stroke Patients Only)       Balance Overall balance assessment: Needs assistance Sitting-balance support: Feet supported Sitting balance-Leahy Scale: Good     Standing balance support: Bilateral upper extremity supported, During functional activity, Reliant on assistive device for balance Standing balance-Leahy Scale: Fair                              Communication Communication Communication: No apparent difficulties  Cognition Arousal: Alert Behavior During Therapy: WFL for tasks assessed/performed   PT - Cognitive impairments: No apparent impairments                         Following commands: Intact      Cueing Cueing Techniques: Verbal cues  Exercises      General Comments        Pertinent Vitals/Pain Pain Assessment Pain Assessment: No/denies pain     Home Living                          Prior Function            PT Goals (current goals can now be found in the care plan section) Progress towards PT goals: Progressing toward goals    Frequency    Min 2X/week      PT Plan      Co-evaluation              AM-PAC PT 6 Clicks Mobility   Outcome Measure  Help needed turning from your back to your side while in a flat bed without using bedrails?: None Help needed moving from lying on your back to sitting on the side of a flat bed without using bedrails?: A Little Help needed moving to and from a bed to a chair (including a wheelchair)?: A Little Help needed standing up from a chair using your arms (e.g., wheelchair or bedside chair)?: None Help needed to walk in hospital room?: None Help needed climbing 3-5 steps with a railing? : A Lot 6 Click Score: 20    End of Session Equipment Utilized During Treatment: Oxygen  Activity Tolerance: Patient tolerated treatment well Patient left: in chair;with call bell/phone within reach;with chair alarm set Nurse Communication: Mobility status PT Visit Diagnosis: Unsteadiness on feet (R26.81);Other abnormalities of gait and mobility (R26.89);Muscle weakness (generalized) (M62.81)     Time: 8693-8681 PT Time Calculation (min) (ACUTE ONLY): 12 min  Charges:    $Gait Training: 8-22 mins PT General Charges $$ ACUTE PT VISIT: 1 Visit                    Lauraine Gills, PTA 12/13/2024, 1:29 PM

## 2024-12-13 NOTE — Plan of Care (Signed)

## 2024-12-13 NOTE — Care Management Important Message (Signed)
 Important Message  Patient Details  Name: [name]. Kellett MRN: [id number] Date of Birth: [birth year]   Important Message Given:  Yes - Medicare IM     [name] 12/13/2024, 4:56 PM

## 2024-12-14 DIAGNOSIS — I110 Hypertensive heart disease with heart failure: Secondary | ICD-10-CM | POA: Diagnosis not present

## 2024-12-14 DIAGNOSIS — R0602 Shortness of breath: Secondary | ICD-10-CM | POA: Diagnosis not present

## 2024-12-14 DIAGNOSIS — I5033 Acute on chronic diastolic (congestive) heart failure: Secondary | ICD-10-CM | POA: Diagnosis not present

## 2024-12-14 LAB — BASIC METABOLIC PANEL WITH GFR
Anion gap: 13 (ref 5–15)
BUN: 37 mg/dL — ABNORMAL HIGH (ref 8–23)
CO2: 30 mmol/L (ref 22–32)
Calcium: 9 mg/dL (ref 8.9–10.3)
Chloride: 92 mmol/L — ABNORMAL LOW (ref 98–111)
Creatinine, Ser: 1.84 mg/dL — ABNORMAL HIGH (ref 0.44–1.00)
GFR, Estimated: 30 mL/min — ABNORMAL LOW
Glucose, Bld: 84 mg/dL (ref 70–99)
Potassium: 4.4 mmol/L (ref 3.5–5.1)
Sodium: 134 mmol/L — ABNORMAL LOW (ref 135–145)

## 2024-12-14 LAB — MAGNESIUM: Magnesium: 2.1 mg/dL (ref 1.7–2.4)

## 2024-12-14 LAB — HEMOGLOBIN A1C
Hgb A1c MFr Bld: 5.4 % (ref 4.8–5.6)
Mean Plasma Glucose: 108.28 mg/dL

## 2024-12-14 LAB — UREA NITROGEN, URINE: Urea Nitrogen, Ur: 480 mg/dL

## 2024-12-14 MED ORDER — SODIUM CHLORIDE 0.9 % IV SOLN
2.0000 g | INTRAVENOUS | Status: DC
  Administered 2024-12-14 – 2024-12-16 (×3): 2 g via INTRAVENOUS
  Filled 2024-12-14 (×4): qty 20

## 2024-12-14 NOTE — Progress Notes (Signed)
 " PROGRESS NOTE    [name]. [name]  FMW:[id number] DOB: 08-Apr-1961 DOA: 12/08/2024 PCP: Bailey Norleen BIRCH, MD    Brief Narrative:  63 y.o. year old female with medical history of hypertension, hyperlipidemia, HFpEF (EF 60%, G2 DD and 06/2021), OSA and chronic hypoxic respiratory failure on 2 L secondary to above, presented to the ED with shortness of breath.  Also reports intermittent dizziness, admitted for acute on chronic HFpEF.  Hospital course as below   Acute on chronic HFpEF Pt with hx of CHF and signs of volume overload on exam and imaging suggestive of CHF exacerbation. Suspect her BNP falsely negative due to elevated BMI She is supposed to be on Lasix  80mg  daily and metolazone  2.5 thrice weekly but is not taking these as she ran out Repeat echocardiogram shows a supranormal ejection fraction with grade 1 diastolic dysfunction.  Hold IV lasix  due to AKI Strict I's and O's, daily weights, fluid restrict  Chronic hypoxic respiratory failure On 2 to 3 L [place] at home  OSA w/ likely OHS  Nightly CPAP. Patient does not wear this at home as she does not like the mask. Would like to see if RT could recommend different mask and this be ordered via DME on discharge.  Patient may benefit from bipap as she has elevated PCO2 as well Sleep clinic follow up outpatient  Vertigo - resolved Seen in consultation by physical therapy.  Patient's body habitus precludes MRI, however CTA H&N is negative for LVO.  Physical therapy evaluation suggest a peripheral vestibular issue, not BPPV.   As needed meclizine   Orthostatic vitals negative 12/23 Continue therapy evaluations   Hypertension Hold diuresis Hold Losartan , atenolol  due to bradycardia/hypotension  AKI  Possibly due to diuresis.  Cr increased 1.84, Hold diuresis FeUrea 6.2% - prerenal US  renal no evidence of hydronephrosis Monitor Cr, UO  Hyperlipidemia Not currently on statin Lipid panel reassuring  UTI Reports dysuria, Start IV  Ceftriaxone  Send urine culture   Morbid obesity BMI is 80.  She will benefit from therapies for weight loss.  These need to be considered on outpatient basis.  Lifestyle modifications and overall benefits of weight loss on her health were discussed  This is a complicating factor in overall care and prognosis   Home PT/OT/HHA at discharge  DVT prophylaxis: SQH Code Status: Full Family Communication: None Disposition Plan: Status is: Inpatient The patient will require care spanning > 2 midnights and should be moved to inpatient because: Acute decompensated diastolic congestive heart failure IV diuretic   Level of care: Telemetry  Consultants:  None  Procedures:  None  Antimicrobials: None   Subjective: Patient reports feeling slightly better today, denies dizziness Able to void with no difficulty Hold diuresis due to AKI, monitor urine output  Objective: Vitals:   12/14/24 0105 12/14/24 0416 12/14/24 0615 12/14/24 0820  BP:  (!) 114/57  99/83  Pulse:  (!) 56  67  Resp:  20  20  Temp:  98.5 F (36.9 C)  98.2 F (36.8 C)  TempSrc:  Oral  Oral  SpO2: 98% 100%  96%  Weight:   (!) 191.8 kg   Height:        Intake/Output Summary (Last 24 hours) at 12/14/2024 1523 Last data filed at 12/14/2024 1443 Gross per 24 hour  Intake 2290 ml  Output 730 ml  Net 1560 ml   Filed Weights   12/12/24 0505 12/13/24 0508 12/14/24 0615  Weight: (!) 190.7 kg (!) 190.6 kg ROLLEN)  191.8 kg    Examination:   Constitutional: In no distress, morbidly obese Cardiovascular: Normal rate, regular rhythm. trace lower extremity edema  Pulmonary: Non labored breathing on [place], no wheezing or rales.   Abdominal: Soft. Obese Non distended and non tender Musculoskeletal: Normal range of motion.     Neurological: Alert and oriented to person, place, and time. Non focal  Skin: Skin is warm and dry.    Data Reviewed: I have personally reviewed following labs and imaging studies  CBC: Recent  Labs  Lab 12/08/24 1152 12/09/24 0844 12/12/24 0409 12/13/24 0345  WBC 7.9 6.5 10.2 10.2  NEUTROABS 3.2  --   --   --   HGB 12.5 13.2 11.8* 11.6*  HCT 39.0 42.7 37.3 37.2  MCV 99.0 101.7* 100.8* 100.3*  PLT 225 213 203 223   Basic Metabolic Panel: Recent Labs  Lab 12/08/24 1409 12/09/24 0844 12/10/24 0338 12/11/24 0409 12/12/24 0409 12/13/24 0345 12/14/24 0346  NA 141   < > 139 140 136 137 134*  K 4.0   < > 3.9 4.3 4.2 4.2 4.4  CL 101   < > 95* 97* 93* 93* 92*  CO2 31   < > 33* 35* 32 34* 30  GLUCOSE 113*   < > 98 89 101* 101* 84  BUN 12   < > 17 21 24* 31* 37*  CREATININE 0.58   < > 0.92 1.08* 1.14* 1.65* 1.84*  CALCIUM  9.3   < > 9.4 9.1 8.5* 9.3 9.0  MG 1.8  --  1.6* 2.0 2.0  --  2.1   < > = values in this interval not displayed.   GFR: Estimated Creatinine Clearance: 52.1 mL/min (A) (by C-G formula based on SCr of 1.84 mg/dL (H)). Liver Function Tests: Recent Labs  Lab 12/08/24 1409  AST 19  ALT 17  ALKPHOS 78  BILITOT 0.3  PROT 6.9  ALBUMIN  3.7   No results for input(s): LIPASE, AMYLASE in the last 168 hours. No results for input(s): AMMONIA in the last 168 hours. Coagulation Profile: No results for input(s): INR, PROTIME in the last 168 hours. Cardiac Enzymes: No results for input(s): CKTOTAL, CKMB, CKMBINDEX, TROPONINI in the last 168 hours. BNP (last 3 results) Recent Labs    12/08/24 1409  PROBNP 214.0   HbA1C: Recent Labs    12/13/24 0344  HGBA1C 5.4   CBG: Recent Labs  Lab 12/10/24 0807 12/11/24 0823 12/12/24 0832 12/13/24 0818  GLUCAP 101* 96 90 127*   Lipid Profile: No results for input(s): CHOL, HDL, LDLCALC, TRIG, CHOLHDL, LDLDIRECT in the last 72 hours.  Thyroid  Function Tests: No results for input(s): TSH, T4TOTAL, FREET4, T3FREE, THYROIDAB in the last 72 hours. Anemia Panel: No results for input(s): VITAMINB12, FOLATE, FERRITIN, TIBC, IRON, RETICCTPCT in the last 72  hours. Sepsis Labs: No results for input(s): PROCALCITON, LATICACIDVEN in the last 168 hours.  Recent Results (from the past 240 hours)  Resp panel by RT-PCR (RSV, Flu A&B, Covid) Anterior Nasal Swab     Status: None   Collection Time: 12/08/24 11:52 AM   Specimen: Anterior Nasal Swab  Result Value Ref Range Status   SARS Coronavirus 2 by RT PCR NEGATIVE NEGATIVE Final    Comment: (NOTE) SARS-CoV-2 target nucleic acids are NOT DETECTED.  The SARS-CoV-2 RNA is generally detectable in upper respiratory specimens during the acute phase of infection. The lowest concentration of SARS-CoV-2 viral copies this assay can detect is 138 copies/mL. A negative result  does not preclude SARS-Cov-2 infection and should not be used as the sole basis for treatment or other patient management decisions. A negative result may occur with  improper specimen collection/handling, submission of specimen other than nasopharyngeal swab, presence of viral mutation(s) within the areas targeted by this assay, and inadequate number of viral copies(<138 copies/mL). A negative result must be combined with clinical observations, patient history, and epidemiological information. The expected result is Negative.  Fact Sheet for Patients:  bloggercourse.com  Fact Sheet for Healthcare Providers:  seriousbroker.it  This test is no t yet approved or cleared by the [place]  FDA and  has been authorized for detection and/or diagnosis of SARS-CoV-2 by FDA under an Emergency Use Authorization (EUA). This EUA will remain  in effect (meaning this test can be used) for the duration of the COVID-19 declaration under Section 564(b)(1) of the Act, 21 U.S.C.section 360bbb-3(b)(1), unless the authorization is terminated  or revoked sooner.       Influenza A by PCR NEGATIVE NEGATIVE Final   Influenza B by PCR NEGATIVE NEGATIVE Final    Comment: (NOTE) The Xpert  Xpress SARS-CoV-2/FLU/RSV plus assay is intended as an aid in the diagnosis of influenza from Nasopharyngeal swab specimens and should not be used as a sole basis for treatment. Nasal washings and aspirates are unacceptable for Xpert Xpress SARS-CoV-2/FLU/RSV testing.  Fact Sheet for Patients: bloggercourse.com  Fact Sheet for Healthcare Providers: seriousbroker.it  This test is not yet approved or cleared by the [place]  FDA and has been authorized for detection and/or diagnosis of SARS-CoV-2 by FDA under an Emergency Use Authorization (EUA). This EUA will remain in effect (meaning this test can be used) for the duration of the COVID-19 declaration under Section 564(b)(1) of the Act, 21 U.S.C. section 360bbb-3(b)(1), unless the authorization is terminated or revoked.     Resp Syncytial Virus by PCR NEGATIVE NEGATIVE Final    Comment: (NOTE) Fact Sheet for Patients: bloggercourse.com  Fact Sheet for Healthcare Providers: seriousbroker.it  This test is not yet approved or cleared by the [place]  FDA and has been authorized for detection and/or diagnosis of SARS-CoV-2 by FDA under an Emergency Use Authorization (EUA). This EUA will remain in effect (meaning this test can be used) for the duration of the COVID-19 declaration under Section 564(b)(1) of the Act, 21 U.S.C. section 360bbb-3(b)(1), unless the authorization is terminated or revoked.  Performed at [facility], [address], [place], [place] [address]          Radiology Studies: US  RENAL Result Date: 12/14/2024 CLINICAL DATA:  Hydronephrosis. EXAM: RENAL / URINARY TRACT ULTRASOUND COMPLETE COMPARISON:  None Available. FINDINGS: Right Kidney: Renal measurements: 10.2 x 4.7 x 5.5 cm = volume: 138 mL. Normal parenchymal echogenicity. No hydronephrosis. No visualized stone. 10 mm cyst in the  lateral kidney, needs no further imaging follow-up. Left Kidney: Renal measurements: 10.3 x 5.2 x 4.9 cm = volume: 138 mL. Normal parenchymal echogenicity. No hydronephrosis. No visualized stone or focal lesion. Bladder: Not visualized on the current exam. Other: Technically limited due to body habitus. IMPRESSION: No hydronephrosis. Electronically Signed   By: Andrea Gasman M.D.   On: 12/14/2024 00:03         Scheduled Meds:  heparin   5,000 Units Subcutaneous Q8H   nystatin  cream   Topical TID   polyethylene glycol  17 g Oral Daily   senna-docusate  1 tablet Oral QHS   sodium chloride  flush  3 mL Intravenous Q12H  Continuous Infusions:  I personally spent a total of 56 minutes in the care of the patient today including getting/reviewing separately obtained history, performing a medically appropriate exam/evaluation, placing orders, documenting clinical information in the EHR, independently interpreting results, and coordinating care.    LOS: 3 days    Laree Lock, MD [facility]   If 7PM-7AM, please contact night-coverage  12/14/2024, 3:23 PM   "

## 2024-12-14 NOTE — Progress Notes (Signed)
 Occupational Therapy Treatment Patient Details Name: [name] MRN: [id number] DOB: 1961/05/28 Today's Date: 12/14/2024   History of present illness [name] is a 62yoF who comes to [facility] after waking middle of night with dizziness, spinning, imbalance. Pt reoprts symptoms were persistent and are still present upon PT evaluation in ED on 12/09/24. PMH: CHF, lymphedema, OSA, home O2. Chart shows similar presentation to ED in July 2022.   OT comments  Upon entering the room, pt supine in bed and agreeable to OT intervention. Pt performing supine >sit with min guard and use of bed rails. Pt needing to use bathroom and stands with min A and transfers with RW to [facility]. Pt able to void. Max A for hygiene and needs assist to place barrier cream. Pt returning to bed and taking seated rest break for several minutes on EOB. Pt returning to supine with mod A with assistance for B LEs. Call bell and all needed items within reach.       If plan is discharge home, recommend the following:  A little help with walking and/or transfers;A little help with bathing/dressing/bathroom   Equipment Recommendations  Wheelchair (measurements OT);Wheelchair cushion (measurements OT)       Precautions / Restrictions Precautions Precautions: Fall Recall of Precautions/Restrictions: Intact       Mobility Bed Mobility Overal bed mobility: Needs Assistance Bed Mobility: Supine to Sit Rolling: Supervision   Supine to sit: Contact guard, Used rails Sit to supine: Mod assist        Transfers Overall transfer level: Needs assistance Equipment used: Rolling walker (2 wheels) Transfers: Sit to/from Stand Sit to Stand: Supervision                 Balance Overall balance assessment: Needs assistance Sitting-balance support: Feet supported Sitting balance-Leahy Scale: Good     Standing balance support: Bilateral upper extremity supported, During functional activity, Reliant on assistive device for  balance Standing balance-Leahy Scale: Fair                             ADL either performed or assessed with clinical judgement   ADL Overall ADL's : Needs assistance/impaired                         Toilet Transfer: Contact guard assist;BSC/3in1;Rolling walker (2 wheels)   Toileting- Clothing Manipulation and Hygiene: Sit to/from stand;Maximal assistance Toileting - Clothing Manipulation Details (indicate cue type and reason): max A pericare while pt stands            Extremity/Trunk Assessment Upper Extremity Assessment Upper Extremity Assessment: Generalized weakness   Lower Extremity Assessment Lower Extremity Assessment: Generalized weakness        Vision Patient Visual Report: No change from baseline           Communication Communication Communication: No apparent difficulties   Cognition Arousal: Alert Behavior During Therapy: WFL for tasks assessed/performed Cognition: No apparent impairments                               Following commands: Intact        Cueing   Cueing Techniques: Verbal cues             Pertinent Vitals/ Pain       Pain Assessment Pain Assessment: Faces Faces Pain Scale: Hurts little more Pain Location: buttocks with  pericare Pain Descriptors / Indicators: Grimacing, Discomfort Pain Intervention(s): Limited activity within patient's tolerance, Repositioned         Frequency  Min 1X/week        Progress Toward Goals  OT Goals(current goals can now be found in the care plan section)  Progress towards OT goals: Progressing toward goals      AM-PAC OT 6 Clicks Daily Activity     Outcome Measure   Help from another person eating meals?: None Help from another person taking care of personal grooming?: None Help from another person toileting, which includes using toliet, bedpan, or urinal?: A Lot Help from another person bathing (including washing, rinsing, drying)?: A  Little Help from another person to put on and taking off regular upper body clothing?: A Little Help from another person to put on and taking off regular lower body clothing?: A Lot 6 Click Score: 18    End of Session Equipment Utilized During Treatment: Oxygen   OT Visit Diagnosis: Unsteadiness on feet (R26.81)   Activity Tolerance Patient limited by fatigue   Patient Left in bed;with call bell/phone within reach;with bed alarm set   Nurse Communication          Time: 9144-9080 OT Time Calculation (min): 24 min  Charges: OT General Charges $OT Visit: 1 Visit OT Treatments $Self Care/Home Management : 23-37 mins  Bailey Claude, MS, OTR/L , CBIS ascom [phone]  12/14/2024, 12:46 PM

## 2024-12-14 NOTE — Consult Note (Addendum)
 WOC Nurse Consult Note: see original consult note 12/21  Reason for Consult: re-evaluate wounds  Wound type: 1. Intertriginous dermatitis underneath breasts linear with some tan tissue forming likely from moisture and friction  2.  Full thickness L anterior thigh ? R/t trauma versus blister that has ruptured  3.  Full thickness L inner buttock 50% red 50% tan; R medial buttock 2 full thickness areas 50% red 50% tan  4.  Buttocks and posterior thighs with linear areas of partial thickness skin loss ? Etiology appears the same as wounds underneath breasts; some red moist open areas  5.  Partial thickness R axilla likely intertriginous dermatitis  Pressure Injury POA: do not appear to be related to pressure  Measurement: see nursing flowsheet  Wound bed: as above  Drainage (amount, consistency, odor) see nursing flowsheet  Periwound: Dressing procedure/placement/frequency:  Cleanse underneath breasts with Vashe wound cleanser, do not rinse.  Apply silver hydrofiber ([employer] #[id number] Aquacel AG)  to open wound beds daily then apply Interdry AG as follows Order Gerlean # [phone] Measure and cut length of InterDry to fit in skin folds that have skin breakdown Tuck InterDry fabric into skin folds in a single layer, allow for 2 inches of overhang from skin edges to allow for wicking to occur May remove to bathe; dry area thoroughly and then tuck into affected areas again Do not apply any creams or ointments when using InterDry DO NOT THROW AWAY FOR 5 DAYS unless soiled with stool DO NOT [facility] product, this will inactivate the silver in the material  New sheet of Interdry should be applied after 5 days of use if patient continues to have skin breakdown 2.  Cleanse all open wounds to anterior and posterior thighs and buttocks with Vashe, apply Xeroform gauze ([employer] [phone]) to wound beds daily and secure with silicone foam or ABD pads and clothe tape whichever works best.   3. Cleanse underneath R axilla with  Vashe, apply a piece of Interdry AG cut to fit area daily.  Follow directions as for underneath breasts.    POC discussed with bedside nurse.  WOC team will not follow. Reconsult if further needs arise.   Thank you,    Powell Bar MSN, RN-BC, [employer]

## 2024-12-14 NOTE — Plan of Care (Signed)

## 2024-12-14 NOTE — Progress Notes (Signed)
 Physical Therapy Treatment Patient Details Name: [name] MRN: [id number] DOB: 03-Jan-1961 Today's Date: 12/14/2024   History of Present Illness [name] is a 62yoF who comes to [facility] after waking middle of night with dizziness, spinning, imbalance. Pt reoprts symptoms were persistent and are still present upon PT evaluation in ED on 12/09/24. PMH: CHF, lymphedema, OSA, home O2. Chart shows similar presentation to ED in July 2022.    PT Comments  Pt seen for PT tx with pt reporting increased LLE pain but aware of benefits of participation & agreeable to do so. Pt is able to complete sidelying>sit with supervision, requires mod assist to elevate BLE upon return to bed. Pt ambulates to door & back with RW & supervision, continent void on BSC. Pt is progressing steadily with mobility. Recommend ongoing PT services & f/u HHPT.    If plan is discharge home, recommend the following: A little help with walking and/or transfers;Assist for transportation;Assistance with cooking/housework   Can travel by Doctor, Hospital cushion (measurements PT);Wheelchair (measurements PT) (bariatric)    Recommendations for Other Services       Precautions / Restrictions Precautions Precautions: Fall Recall of Precautions/Restrictions: Intact Restrictions Weight Bearing Restrictions Per Provider Order: No     Mobility  Bed Mobility   Bed Mobility: Sidelying to Sit, Sit to Sidelying   Sidelying to sit: Supervision, HOB elevated, Used rails (extra time, exit L side of bed)     Sit to sidelying: Mod assist (to elevate BLE onto bed)      Transfers   Equipment used: Rolling walker (2 wheels) Transfers: Sit to/from Stand Sit to Stand: Supervision           General transfer comment: uses momentum/rocking to transfer sit>stand from EOB, [facility]    Ambulation/Gait Ambulation/Gait assistance: Supervision Gait Distance (Feet): 27 Feet Assistive  device: Rolling walker (2 wheels) Gait Pattern/deviations: Decreased step length - right, Decreased step length - left, Decreased stride length, Step-through pattern Gait velocity: decreased     General Gait Details: to door & back, no LOB   Stairs             Wheelchair Mobility     Tilt Bed    Modified Rankin (Stroke Patients Only)       Balance Overall balance assessment: Needs assistance Sitting-balance support: Feet supported Sitting balance-Leahy Scale: Good     Standing balance support: Bilateral upper extremity supported, During functional activity, Reliant on assistive device for balance Standing balance-Leahy Scale: Good                              Communication Communication Communication: No apparent difficulties  Cognition Arousal: Alert Behavior During Therapy: WFL for tasks assessed/performed   PT - Cognitive impairments: No apparent impairments                         Following commands: Intact      Cueing Cueing Techniques: Verbal cues  Exercises      General Comments General comments (skin integrity, edema, etc.): continent void on BSC, PT assists with peri hygiene. SPO2 >90% on 3L/min via nasal cannula throughout session.      Pertinent Vitals/Pain Pain Assessment Pain Assessment: Faces Faces Pain Scale: Hurts even more Pain Location: LLE Pain Descriptors / Indicators: Discomfort Pain Intervention(s): Monitored during session, Limited activity within patient's  tolerance    Home Living                          Prior Function            PT Goals (current goals can now be found in the care plan section) Acute Rehab PT Goals Patient Stated Goal: be able to DC back to home; resolve vertigo PT Goal Formulation: With patient Time For Goal Achievement: 12/23/24 Potential to Achieve Goals: Good Progress towards PT goals: Progressing toward goals    Frequency    Min 2X/week      PT Plan       Co-evaluation              AM-PAC PT 6 Clicks Mobility   Outcome Measure  Help needed turning from your back to your side while in a flat bed without using bedrails?: None Help needed moving from lying on your back to sitting on the side of a flat bed without using bedrails?: A Little Help needed moving to and from a bed to a chair (including a wheelchair)?: A Little Help needed standing up from a chair using your arms (e.g., wheelchair or bedside chair)?: None Help needed to walk in hospital room?: A Little Help needed climbing 3-5 steps with a railing? : A Lot 6 Click Score: 19    End of Session Equipment Utilized During Treatment: Oxygen  Activity Tolerance: Patient tolerated treatment well;Patient limited by pain (LLE pain) Patient left: in bed;with call bell/phone within reach   PT Visit Diagnosis: Unsteadiness on feet (R26.81);Other abnormalities of gait and mobility (R26.89);Muscle weakness (generalized) (M62.81)     Time: 8455-8397 PT Time Calculation (min) (ACUTE ONLY): 18 min  Charges:    $Therapeutic Activity: 8-22 mins PT General Charges $$ ACUTE PT VISIT: 1 Visit                     [name], PT, DPT 12/14/2024, 4:10 PM   [name] 12/14/2024, 4:09 PM

## 2024-12-15 ENCOUNTER — Encounter: Payer: Self-pay | Admitting: Internal Medicine

## 2024-12-15 DIAGNOSIS — I110 Hypertensive heart disease with heart failure: Secondary | ICD-10-CM | POA: Diagnosis not present

## 2024-12-15 DIAGNOSIS — I5033 Acute on chronic diastolic (congestive) heart failure: Secondary | ICD-10-CM | POA: Diagnosis not present

## 2024-12-15 DIAGNOSIS — R0602 Shortness of breath: Secondary | ICD-10-CM | POA: Diagnosis not present

## 2024-12-15 LAB — BASIC METABOLIC PANEL WITH GFR
Anion gap: 10 (ref 5–15)
BUN: 28 mg/dL — ABNORMAL HIGH (ref 8–23)
CO2: 32 mmol/L (ref 22–32)
Calcium: 9.2 mg/dL (ref 8.9–10.3)
Chloride: 92 mmol/L — ABNORMAL LOW (ref 98–111)
Creatinine, Ser: 1.17 mg/dL — ABNORMAL HIGH (ref 0.44–1.00)
GFR, Estimated: 52 mL/min — ABNORMAL LOW
Glucose, Bld: 88 mg/dL (ref 70–99)
Potassium: 4.3 mmol/L (ref 3.5–5.1)
Sodium: 135 mmol/L (ref 135–145)

## 2024-12-15 LAB — MAGNESIUM: Magnesium: 2 mg/dL (ref 1.7–2.4)

## 2024-12-15 NOTE — Progress Notes (Signed)
 Mobility Specialist - Progress Note  Pre-mobility: SpO2-100%  During mobility:, SpO2-99%  Post-mobility:  SPO2-99%   12/15/24 1200  Mobility  Activity Ambulated with assistance;Stood at bedside;Dangled on edge of bed;Pivoted/transferred to/from [facility]  Level of Assistance Contact guard assist, steadying assist  Assistive Device Four wheel walker  Distance Ambulated (ft) 12 ft  Range of Motion/Exercises All extremities  Activity Response Tolerated well  Mobility visit 1 Mobility  Mobility Specialist Start Time (ACUTE ONLY) 0950  Mobility Specialist Stop Time (ACUTE ONLY) 1010  Mobility Specialist Time Calculation (min) (ACUTE ONLY) 20 min   Pt was supine in bed on O2 @ 3 L upon entry. Pt agreed to mobility. Pt is able to get to the EOB independently with bed features and time. Pt is able to STS with 4 WW and minA CGA. Pt ambulated well. Pt is able today to ambulate a pivot to [facility] with minA. After activity pt returned to bed with needs in reach. Pt O2 vitals WNL throughout activity.  Clem Rodes Mobility Specialist 12/15/2024, 12:23 PM

## 2024-12-15 NOTE — Progress Notes (Signed)
 Wound care completed this morning per order. No InterDry available, ABD applied on top of Hydrofiber dressing. CN made aware that Inter Dry needs to be ordered. I will pass on to day shift nurse.

## 2024-12-15 NOTE — Plan of Care (Signed)
" °  Problem: Education: Goal: Knowledge of General Education information will improve Description: Including pain rating scale, medication(s)/side effects and non-pharmacologic comfort measures 12/15/2024 1919 by Eloy Scriver, LPN Outcome: Progressing 12/15/2024 1917 by Eloy Scriver, LPN Outcome: Progressing   Problem: Health Behavior/Discharge Planning: Goal: Ability to manage health-related needs will improve 12/15/2024 1919 by Eloy Scriver, LPN Outcome: Progressing 12/15/2024 1917 by Eloy Scriver, LPN Outcome: Progressing   Problem: Clinical Measurements: Goal: Ability to maintain clinical measurements within normal limits will improve 12/15/2024 1919 by Eloy Scriver, LPN Outcome: Progressing 12/15/2024 1917 by Eloy Scriver, LPN Outcome: Progressing   Problem: Clinical Measurements: Goal: Will remain free from infection 12/15/2024 1919 by Eloy Scriver, LPN Outcome: Progressing 12/15/2024 1917 by Eloy Scriver, LPN Outcome: Progressing   "

## 2024-12-15 NOTE — Progress Notes (Signed)
 " PROGRESS NOTE    [name]. Guastella  FMW:[id number] DOB: 01/23/1961 DOA: 12/08/2024 PCP: [name], MD    Brief Narrative:  63 y.o. year old female with medical history of hypertension, hyperlipidemia, HFpEF (EF 60%, G2 DD and 06/2021), OSA and chronic hypoxic respiratory failure on 2 L secondary to above, presented to the ED with shortness of breath.  Also reports intermittent dizziness, admitted for acute on chronic HFpEF.  Hospital course as below   Acute on chronic HFpEF Pt with hx of CHF and signs of volume overload on exam and imaging suggestive of CHF exacerbation. Suspect her BNP falsely negative due to elevated BMI She is supposed to be on Lasix  80mg  daily and metolazone  2.5 thrice weekly but is not taking these as she ran out Repeat echocardiogram shows a supranormal ejection fraction with grade 1 diastolic dysfunction.  Hold lasix  due to AKI Strict I's and O's, daily weights, fluid restrict  Chronic hypoxic respiratory failure On 2 to 3 L [place] at home  OSA w/ likely OHS  Nightly CPAP. Patient does not wear this at home as she does not like the mask. Would like to see if RT could recommend different mask and this be ordered via DME on discharge.  Patient may benefit from bipap as she has elevated PCO2 as well Sleep clinic follow up outpatient  Vertigo - resolved Seen in consultation by physical therapy.  Patient's body habitus precludes MRI, however CTA H&N is negative for LVO.  Physical therapy evaluation suggest a peripheral vestibular issue, not BPPV.   As needed meclizine   Orthostatic vitals negative 12/23 Continue therapy evaluations   Hypertension Hold diuresis Hold Losartan , atenolol  due to bradycardia/hypotension  AKI - improving Possibly due to diuresis.  Cr peaked 1.84 -> 1.17, Hold diuresis FeUrea 6.2% - prerenal US  renal no evidence of hydronephrosis Monitor Cr, UO  Hyperlipidemia Not currently on statin Lipid panel reassuring  UTI Reports  dysuria, on IV Ceftriaxone  Follow urine culture   Morbid obesity BMI is 80.  She will benefit from therapies for weight loss.  These need to be considered on outpatient basis.  Lifestyle modifications and overall benefits of weight loss on her health were discussed  This is a complicating factor in overall care and prognosis   Home PT/OT/HHA at discharge  DVT prophylaxis: SQH Code Status: Full Family Communication: None Disposition Plan: Status is: Inpatient The patient will require care spanning > 2 midnights and should be moved to inpatient because: Acute decompensated diastolic congestive heart failure IV diuretic   Level of care: Med-Surg  Consultants:  None  Procedures:  None  Antimicrobials: None   Subjective: Patient reports feeling better today, denies any complaints AKI improving, blood pressure soft Once creatinine stable, will resume diuretics and anticipate discharge tomorrow  Objective: Vitals:   12/15/24 0411 12/15/24 0500 12/15/24 0837 12/15/24 1153  BP: (!) 114/52  (!) 102/45 (!) 92/52  Pulse: 62  75 81  Resp: 20  18 18   Temp: 97.6 F (36.4 C)  97.7 F (36.5 C) 98.2 F (36.8 C)  TempSrc:   Oral Oral  SpO2: 100%  100% 100%  Weight:  (!) 192 kg    Height:        Intake/Output Summary (Last 24 hours) at 12/15/2024 1450 Last data filed at 12/15/2024 1408 Gross per 24 hour  Intake 240 ml  Output 1500 ml  Net -1260 ml   Filed Weights   12/13/24 0508 12/14/24 0615 12/15/24 0500  Weight: ROLLEN)  190.6 kg (!) 191.8 kg (!) 192 kg    Examination:   Constitutional: In no distress, morbidly obese Cardiovascular: Normal rate, regular rhythm. trace lower extremity edema  Pulmonary: Non labored breathing on [place], no wheezing or rales.   Abdominal: Soft. Obese Non distended and non tender Musculoskeletal: Normal range of motion.     Neurological: Alert and oriented to person, place, and time. Non focal  Skin: Skin is warm and dry.    Data Reviewed: I  have personally reviewed following labs and imaging studies  CBC: Recent Labs  Lab 12/09/24 0844 12/12/24 0409 12/13/24 0345  WBC 6.5 10.2 10.2  HGB 13.2 11.8* 11.6*  HCT 42.7 37.3 37.2  MCV 101.7* 100.8* 100.3*  PLT 213 203 223   Basic Metabolic Panel: Recent Labs  Lab 12/10/24 0338 12/11/24 0409 12/12/24 0409 12/13/24 0345 12/14/24 0346 12/15/24 0349  NA 139 140 136 137 134* 135  K 3.9 4.3 4.2 4.2 4.4 4.3  CL 95* 97* 93* 93* 92* 92*  CO2 33* 35* 32 34* 30 32  GLUCOSE 98 89 101* 101* 84 88  BUN 17 21 24* 31* 37* 28*  CREATININE 0.92 1.08* 1.14* 1.65* 1.84* 1.17*  CALCIUM  9.4 9.1 8.5* 9.3 9.0 9.2  MG 1.6* 2.0 2.0  --  2.1 2.0   GFR: Estimated Creatinine Clearance: 82 mL/min (A) (by C-G formula based on SCr of 1.17 mg/dL (H)). Liver Function Tests: No results for input(s): AST, ALT, ALKPHOS, BILITOT, PROT, ALBUMIN  in the last 168 hours.  No results for input(s): LIPASE, AMYLASE in the last 168 hours. No results for input(s): AMMONIA in the last 168 hours. Coagulation Profile: No results for input(s): INR, PROTIME in the last 168 hours. Cardiac Enzymes: No results for input(s): CKTOTAL, CKMB, CKMBINDEX, TROPONINI in the last 168 hours. BNP (last 3 results) Recent Labs    12/08/24 1409  PROBNP 214.0   HbA1C: Recent Labs    12/13/24 0344  HGBA1C 5.4   CBG: Recent Labs  Lab 12/10/24 0807 12/11/24 0823 12/12/24 0832 12/13/24 0818  GLUCAP 101* 96 90 127*   Lipid Profile: No results for input(s): CHOL, HDL, LDLCALC, TRIG, CHOLHDL, LDLDIRECT in the last 72 hours.  Thyroid  Function Tests: No results for input(s): TSH, T4TOTAL, FREET4, T3FREE, THYROIDAB in the last 72 hours. Anemia Panel: No results for input(s): VITAMINB12, FOLATE, FERRITIN, TIBC, IRON, RETICCTPCT in the last 72 hours. Sepsis Labs: No results for input(s): PROCALCITON, LATICACIDVEN in the last 168 hours.  Recent  Results (from the past 240 hours)  Resp panel by RT-PCR (RSV, Flu A&B, Covid) Anterior Nasal Swab     Status: None   Collection Time: 12/08/24 11:52 AM   Specimen: Anterior Nasal Swab  Result Value Ref Range Status   SARS Coronavirus 2 by RT PCR NEGATIVE NEGATIVE Final    Comment: (NOTE) SARS-CoV-2 target nucleic acids are NOT DETECTED.  The SARS-CoV-2 RNA is generally detectable in upper respiratory specimens during the acute phase of infection. The lowest concentration of SARS-CoV-2 viral copies this assay can detect is 138 copies/mL. A negative result does not preclude SARS-Cov-2 infection and should not be used as the sole basis for treatment or other patient management decisions. A negative result may occur with  improper specimen collection/handling, submission of specimen other than nasopharyngeal swab, presence of viral mutation(s) within the areas targeted by this assay, and inadequate number of viral copies(<138 copies/mL). A negative result must be combined with clinical observations, patient history, and epidemiological information. The  expected result is Negative.  Fact Sheet for Patients:  bloggercourse.com  Fact Sheet for Healthcare Providers:  seriousbroker.it  This test is no t yet approved or cleared by the [place]  FDA and  has been authorized for detection and/or diagnosis of SARS-CoV-2 by FDA under an Emergency Use Authorization (EUA). This EUA will remain  in effect (meaning this test can be used) for the duration of the COVID-19 declaration under Section 564(b)(1) of the Act, 21 U.S.C.section 360bbb-3(b)(1), unless the authorization is terminated  or revoked sooner.       Influenza A by PCR NEGATIVE NEGATIVE Final   Influenza B by PCR NEGATIVE NEGATIVE Final    Comment: (NOTE) The Xpert Xpress SARS-CoV-2/FLU/RSV plus assay is intended as an aid in the diagnosis of influenza from Nasopharyngeal swab  specimens and should not be used as a sole basis for treatment. Nasal washings and aspirates are unacceptable for Xpert Xpress SARS-CoV-2/FLU/RSV testing.  Fact Sheet for Patients: bloggercourse.com  Fact Sheet for Healthcare Providers: seriousbroker.it  This test is not yet approved or cleared by the [place]  FDA and has been authorized for detection and/or diagnosis of SARS-CoV-2 by FDA under an Emergency Use Authorization (EUA). This EUA will remain in effect (meaning this test can be used) for the duration of the COVID-19 declaration under Section 564(b)(1) of the Act, 21 U.S.C. section 360bbb-3(b)(1), unless the authorization is terminated or revoked.     Resp Syncytial Virus by PCR NEGATIVE NEGATIVE Final    Comment: (NOTE) Fact Sheet for Patients: bloggercourse.com  Fact Sheet for Healthcare Providers: seriousbroker.it  This test is not yet approved or cleared by the [place]  FDA and has been authorized for detection and/or diagnosis of SARS-CoV-2 by FDA under an Emergency Use Authorization (EUA). This EUA will remain in effect (meaning this test can be used) for the duration of the COVID-19 declaration under Section 564(b)(1) of the Act, 21 U.S.C. section 360bbb-3(b)(1), unless the authorization is terminated or revoked.  Performed at [facility], [address] [place], [place] [address]          Radiology Studies: US  RENAL Result Date: 12/14/2024 CLINICAL DATA:  Hydronephrosis. EXAM: RENAL / URINARY TRACT ULTRASOUND COMPLETE COMPARISON:  None Available. FINDINGS: Right Kidney: Renal measurements: 10.2 x 4.7 x 5.5 cm = volume: 138 mL. Normal parenchymal echogenicity. No hydronephrosis. No visualized stone. 10 mm cyst in the lateral kidney, needs no further imaging follow-up. Left Kidney: Renal measurements: 10.3 x 5.2 x 4.9 cm = volume: 138  mL. Normal parenchymal echogenicity. No hydronephrosis. No visualized stone or focal lesion. Bladder: Not visualized on the current exam. Other: Technically limited due to body habitus. IMPRESSION: No hydronephrosis. Electronically Signed   By: Andrea Gasman M.D.   On: 12/14/2024 00:03         Scheduled Meds:  heparin   5,000 Units Subcutaneous Q8H   polyethylene glycol  17 g Oral Daily   senna-docusate  1 tablet Oral QHS   sodium chloride  flush  3 mL Intravenous Q12H   Continuous Infusions:  cefTRIAXone  (ROCEPHIN )  IV Stopped (12/14/24 1707)    I personally spent a total of 36 minutes in the care of the patient today including getting/reviewing separately obtained history, performing a medically appropriate exam/evaluation, placing orders, documenting clinical information in the EHR, independently interpreting results, and coordinating care.    LOS: 4 days    Laree Lock, MD [facility]   If 7PM-7AM, please contact night-coverage  12/15/2024, 2:50 PM   "

## 2024-12-15 NOTE — Plan of Care (Signed)

## 2024-12-16 ENCOUNTER — Other Ambulatory Visit: Payer: Self-pay

## 2024-12-16 ENCOUNTER — Inpatient Hospital Stay

## 2024-12-16 DIAGNOSIS — Z23 Encounter for immunization: Secondary | ICD-10-CM | POA: Diagnosis not present

## 2024-12-16 DIAGNOSIS — R001 Bradycardia, unspecified: Secondary | ICD-10-CM | POA: Diagnosis not present

## 2024-12-16 DIAGNOSIS — N390 Urinary tract infection, site not specified: Secondary | ICD-10-CM | POA: Diagnosis not present

## 2024-12-16 DIAGNOSIS — M79605 Pain in left leg: Secondary | ICD-10-CM | POA: Diagnosis not present

## 2024-12-16 DIAGNOSIS — J45909 Unspecified asthma, uncomplicated: Secondary | ICD-10-CM | POA: Diagnosis not present

## 2024-12-16 DIAGNOSIS — Z7982 Long term (current) use of aspirin: Secondary | ICD-10-CM | POA: Diagnosis not present

## 2024-12-16 DIAGNOSIS — Z1152 Encounter for screening for COVID-19: Secondary | ICD-10-CM | POA: Diagnosis not present

## 2024-12-16 DIAGNOSIS — R0602 Shortness of breath: Secondary | ICD-10-CM | POA: Diagnosis not present

## 2024-12-16 DIAGNOSIS — Z8419 Family history of other disorders of kidney and ureter: Secondary | ICD-10-CM | POA: Diagnosis not present

## 2024-12-16 DIAGNOSIS — N179 Acute kidney failure, unspecified: Secondary | ICD-10-CM | POA: Diagnosis not present

## 2024-12-16 DIAGNOSIS — Z87891 Personal history of nicotine dependence: Secondary | ICD-10-CM | POA: Diagnosis not present

## 2024-12-16 DIAGNOSIS — J9611 Chronic respiratory failure with hypoxia: Secondary | ICD-10-CM | POA: Diagnosis not present

## 2024-12-16 DIAGNOSIS — I110 Hypertensive heart disease with heart failure: Secondary | ICD-10-CM | POA: Diagnosis not present

## 2024-12-16 DIAGNOSIS — Z6841 Body Mass Index (BMI) 40.0 and over, adult: Secondary | ICD-10-CM | POA: Diagnosis not present

## 2024-12-16 DIAGNOSIS — I5033 Acute on chronic diastolic (congestive) heart failure: Secondary | ICD-10-CM | POA: Diagnosis not present

## 2024-12-16 DIAGNOSIS — Z823 Family history of stroke: Secondary | ICD-10-CM | POA: Diagnosis not present

## 2024-12-16 DIAGNOSIS — Z9981 Dependence on supplemental oxygen: Secondary | ICD-10-CM | POA: Diagnosis not present

## 2024-12-16 DIAGNOSIS — K219 Gastro-esophageal reflux disease without esophagitis: Secondary | ICD-10-CM | POA: Diagnosis not present

## 2024-12-16 DIAGNOSIS — Z79899 Other long term (current) drug therapy: Secondary | ICD-10-CM | POA: Diagnosis not present

## 2024-12-16 DIAGNOSIS — G4733 Obstructive sleep apnea (adult) (pediatric): Secondary | ICD-10-CM | POA: Diagnosis not present

## 2024-12-16 DIAGNOSIS — I959 Hypotension, unspecified: Secondary | ICD-10-CM | POA: Diagnosis not present

## 2024-12-16 DIAGNOSIS — Z8249 Family history of ischemic heart disease and other diseases of the circulatory system: Secondary | ICD-10-CM | POA: Diagnosis not present

## 2024-12-16 DIAGNOSIS — E785 Hyperlipidemia, unspecified: Secondary | ICD-10-CM | POA: Diagnosis not present

## 2024-12-16 DIAGNOSIS — B9620 Unspecified Escherichia coli [E. coli] as the cause of diseases classified elsewhere: Secondary | ICD-10-CM | POA: Diagnosis not present

## 2024-12-16 LAB — BASIC METABOLIC PANEL WITH GFR
Anion gap: 7 (ref 5–15)
BUN: 17 mg/dL (ref 8–23)
CO2: 35 mmol/L — ABNORMAL HIGH (ref 22–32)
Calcium: 9.4 mg/dL (ref 8.9–10.3)
Chloride: 96 mmol/L — ABNORMAL LOW (ref 98–111)
Creatinine, Ser: 0.85 mg/dL (ref 0.44–1.00)
GFR, Estimated: >60 mL/min
Glucose, Bld: 101 mg/dL — ABNORMAL HIGH (ref 70–99)
Potassium: 4 mmol/L (ref 3.5–5.1)
Sodium: 138 mmol/L (ref 135–145)

## 2024-12-16 LAB — URINE CULTURE: Culture: 30000 — AB

## 2024-12-16 LAB — GLUCOSE, CAPILLARY: Glucose-Capillary: 97 mg/dL (ref 70–99)

## 2024-12-16 MED ORDER — FUROSEMIDE 80 MG PO TABS
80.0000 mg | ORAL_TABLET | Freq: Every day | ORAL | 1 refills | Status: AC
  Filled 2024-12-16: qty 30, 30d supply, fill #0

## 2024-12-16 MED ORDER — ACETAMINOPHEN 325 MG PO TABS
650.0000 mg | ORAL_TABLET | Freq: Four times a day (QID) | ORAL | 0 refills | Status: AC | PRN
  Filled 2024-12-16: qty 30, 4d supply, fill #0

## 2024-12-16 MED ORDER — FUROSEMIDE 40 MG PO TABS
80.0000 mg | ORAL_TABLET | Freq: Every day | ORAL | Status: DC
  Administered 2024-12-16: 80 mg via ORAL
  Filled 2024-12-16: qty 2

## 2024-12-16 MED ORDER — NITROFURANTOIN MONOHYD MACRO 100 MG PO CAPS
100.0000 mg | ORAL_CAPSULE | Freq: Two times a day (BID) | ORAL | 0 refills | Status: AC
  Filled 2024-12-16: qty 6, 3d supply, fill #0

## 2024-12-16 MED ORDER — METOLAZONE 2.5 MG PO TABS
2.5000 mg | ORAL_TABLET | Freq: Every day | ORAL | 1 refills | Status: AC
  Filled 2024-12-16: qty 30, 30d supply, fill #0

## 2024-12-16 NOTE — Discharge Summary (Signed)
 " Physician Discharge Summary   Patient: [name]. [name] MRN: [id number] DOB: 25-Nov-1961  Admit date:     12/08/2024  Discharge date: 12/16/2024  Discharge Physician: Laree Lock   PCP: [name], MD   Recommendations at discharge:   Follow-up with PCP within 1 week -monitor BMP, can titrate diuretics as needed Monitor BP, HR  Follow-up with cardiology outpatient Follow-up in sleep clinic  Discharge Diagnoses: Principal Problem:   Acute exacerbation of CHF (congestive heart failure) (HCC) Active Problems:   Vertigo   Essential hypertension   Obstructive sleep apnea   Chronic heart failure with preserved ejection fraction (HFpEF) (HCC)   Obesity, morbid, BMI 50 or higher [facility])  Hospital Course: 63 y.o. year old female with medical history of hypertension, hyperlipidemia, HFpEF (EF 60%, G2 DD and 06/2021), OSA and chronic hypoxic respiratory failure on 2 L secondary to above, presented to the ED with shortness of breath. Also reports intermittent dizziness, admitted for acute on chronic HFpEF. Hospital course as below   Acute on chronic HFpEF Pt with hx of CHF and signs of volume overload on exam and imaging suggestive of CHF exacerbation. Suspect her BNP falsely negative due to elevated BMI She is supposed to be on Lasix  80mg  daily and metolazone  2.5 daily but is not taking these as she ran out Repeat echocardiogram shows a supranormal ejection fraction with grade 1 diastolic dysfunction.  S/p IV diuresis, had AKI which resolved Resume home Lasix , metolazone  Follow up with PCP/ Cardiology outpatient   Chronic hypoxic respiratory failure On 2 to 3 L 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 Patient does not wear this at home as she does not like the mask Sleep clinic follow up outpatient   Vertigo - resolved Seen in consultation by physical therapy.  Patient's body habitus precludes MRI, however CTA H&N is negative for LVO.  Physical therapy evaluation suggest  a peripheral vestibular issue, not BPPV.    Orthostatic vitals negative 12/23   Hypertension Hold Losartan , atenolol  due to bradycardia/hypotension Follow up with PCP   AKI - resolved Possibly due to diuresis.  FeUrea 6.2% - prerenal US  renal no evidence of hydronephrosis   Hyperlipidemia Not currently on statin Lipid panel reassuring   UTI Was on IV ceftriaxone , discharged on Macrobid  to complete course Urine culture growing E. coli, reviewed sensitivities  LLE pain -improving No evidence of cellulitis.  US  negative for DVT   Morbid obesity BMI is 80.  She will benefit from therapies for weight loss.  These need to be considered on outpatient basis.  Lifestyle modifications and overall benefits of weight loss on her health were discussed  This is a complicating factor in overall care and prognosis     Home PT/OT/HHA at discharge   Pain control -   Controlled Substance Reporting System database was reviewed. and patient was instructed, not to drive, operate heavy machinery, perform activities at heights, swimming or participation in water activities or provide baby-sitting services while on Pain, Sleep and Anxiety Medications; until their outpatient Physician has advised to do so again. Also recommended to not to take more than prescribed Pain, Sleep and Anxiety Medications.  Consultants: None Procedures performed: None  Disposition: Home health Diet recommendation:  Discharge Diet Orders (From admission, onward)     Start     Ordered   12/16/24 0000  Diet - low sodium heart healthy        12/16/24 1528  DISCHARGE MEDICATION: Allergies as of 12/16/2024   No Known Allergies      Medication List     PAUSE taking these medications    losartan  100 MG tablet Wait to take this until your doctor or other care provider tells you to start again. Commonly known as: COZAAR  Take 1 tablet (100 mg total) by mouth daily.       TAKE these  medications    acetaminophen  325 MG tablet Commonly known as: TYLENOL  Take 2 tablets (650 mg total) by mouth every 6 (six) hours as needed for mild pain (pain score 1-3) (or Fever >/= 101).   albuterol  108 (90 Base) MCG/ACT inhaler Commonly known as: VENTOLIN  HFA Inhale 2 puffs into the lungs every 6 (six) hours as needed for wheezing or shortness of breath.   furosemide  80 MG tablet Commonly known as: LASIX  Take 1 tablet (80 mg total) by mouth daily. Start taking on: December 17, 2024   metolazone  2.5 MG tablet Commonly known as: ZAROXOLYN  Take 1 tablet (2.5 mg total) by mouth daily. What changed: Another medication with the same name was removed. Continue taking this medication, and follow the directions you see here.   nitrofurantoin  (macrocrystal-monohydrate) 100 MG capsule Commonly known as: Macrobid  Take 1 capsule (100 mg total) by mouth 2 (two) times daily for 3 days.   OXYGEN  Inhale 4 L into the lungs continuous.               Discharge Care Instructions  (From admission, onward)           Start     Ordered   12/16/24 0000  Discharge wound care:       Comments: Cleanse all open wounds to anterior and posterior thighs, buttocks, with Vashe, apply Xeroform gauze ([place] [phone]) to wound beds daily and secure with silicone foam or ABD pads and clothe tape whichever works best  Cleanse underneath breasts with Vashe wound cleanser, do not rinse.  Apply silver hydrofiber ([facility] #[id number] Aquacel AG) to open wound beds daily then apply Interdry AG as follows Order Gerlean # [id number] Measure and cut length of InterDry to fit in skin folds that have skin breakdown Tuck InterDry fabric into skin folds in a single layer, allow for 2 inches of overhang from skin edges to allow for wicking to occur May remove to bathe; dry area thoroughly and then tuck into affected areas again Do not apply any creams or ointments when using InterDry DO NOT THROW AWAY FOR 5 DAYS unless soiled  with stool DO NOT [facility] product, this will inactivate the silver in the material  New sheet of Interdry should be applied after 5 days of use if patient continues to have skin breakdown 2. Cleanse underneath R arm with Vashe, do not rinse. Apply a piece of Interdry AG to area (follow same instructions for underneath breasts)   12/16/24 1528            Contact information for after-discharge care     Home Medical Care     [facility]) .   Service: Home Health Services Contact information: [address] [place]  [address] [phone]                    Discharge Exam: Fredricka Weights   12/14/24 0615 12/15/24 0500 12/16/24 0446  Weight: (!) 191.8 kg (!) 192 kg (!) 198.8 kg   Constitutional: In no distress, morbidly obese Cardiovascular: Normal  rate, regular rhythm. trace lower extremity edema  Pulmonary: Non labored breathing on [place], no wheezing or rales.   Abdominal: Soft. Obese Non distended and non tender Musculoskeletal: Normal range of motion.     Neurological: Alert and oriented to person, place, and time. Non focal  Skin: Skin is warm and dry  Condition at discharge: fair  The results of significant diagnostics from this hospitalization (including imaging, microbiology, ancillary and laboratory) are listed below for reference.   Imaging Studies: US  RENAL Result Date: 12/14/2024 CLINICAL DATA:  Hydronephrosis. EXAM: RENAL / URINARY TRACT ULTRASOUND COMPLETE COMPARISON:  None Available. FINDINGS: Right Kidney: Renal measurements: 10.2 x 4.7 x 5.5 cm = volume: 138 mL. Normal parenchymal echogenicity. No hydronephrosis. No visualized stone. 10 mm cyst in the lateral kidney, needs no further imaging follow-up. Left Kidney: Renal measurements: 10.3 x 5.2 x 4.9 cm = volume: 138 mL. Normal parenchymal echogenicity. No hydronephrosis. No visualized stone or focal lesion. Bladder: Not visualized on the current exam. Other:  Technically limited due to body habitus. IMPRESSION: No hydronephrosis. Electronically Signed   By: Andrea Gasman M.D.   On: 12/14/2024 00:03   ECHOCARDIOGRAM COMPLETE Result Date: 12/10/2024    ECHOCARDIOGRAM REPORT   Patient Name:   SRINIKA DELONE. Leavitt Date of Exam: 12/09/2024 Medical Rec #:  [id number]     Height:       62.0 in Accession #:    [id number]    Weight:       440.0 lb Date of Birth:  [birth year]    BSA:          2.673 m Patient Age:    62 years      BP:           162/147 mmHg Patient Gender: F             HR:           88 bpm. Exam Location:  [facility] Procedure: 2D Echo, Color Doppler, Cardiac Doppler and Intracardiac            Opacification Agent (Both Spectral and Color Flow Doppler were            utilized during procedure). Indications:     CHF-Acute Diastolic I50.31  History:         Patient has prior history of Echocardiogram examinations, most                  recent 06/24/2021. CHF.  Sonographer:     Ashley McNeely-Sloane Referring Phys:  [id number] [facility] Diagnosing Phys: Denyse Bathe IMPRESSIONS  1. Left ventricular ejection fraction, by estimation, is 70 to 75%. The left ventricle has hyperdynamic function. The left ventricle has no regional wall motion abnormalities. Left ventricular diastolic parameters are consistent with Grade I diastolic dysfunction (impaired relaxation).  2. Right ventricular systolic function is normal. The right ventricular size is normal.  3. The mitral valve is normal in structure. Trivial mitral valve regurgitation. No evidence of mitral stenosis.  4. The aortic valve is normal in structure. Aortic valve regurgitation is not visualized. No aortic stenosis is present.  5. The inferior vena cava is normal in size with greater than 50% respiratory variability, suggesting right atrial pressure of 3 mmHg. FINDINGS  Left Ventricle: Left ventricular ejection fraction, by estimation, is 70 to 75%. The left ventricle has hyperdynamic function. The left ventricle has no  regional wall motion abnormalities. Strain was performed and the global longitudinal strain is indeterminate. The left ventricular  internal cavity size was normal in size. There is borderline left ventricular hypertrophy. Left ventricular diastolic parameters are consistent with Grade I diastolic dysfunction (impaired relaxation). Right Ventricle: The right ventricular size is normal. No increase in right ventricular wall thickness. Right ventricular systolic function is normal. Left Atrium: Left atrial size was normal in size. Right Atrium: Right atrial size was normal in size. Pericardium: There is no evidence of pericardial effusion. Mitral Valve: The mitral valve is normal in structure. Trivial mitral valve regurgitation. No evidence of mitral valve stenosis. MV peak gradient, 2.3 mmHg. The mean mitral valve gradient is 1.0 mmHg. Tricuspid Valve: The tricuspid valve is normal in structure. Tricuspid valve regurgitation is not demonstrated. No evidence of tricuspid stenosis. Aortic Valve: The aortic valve is normal in structure. Aortic valve regurgitation is not visualized. No aortic stenosis is present. Aortic valve mean gradient measures 4.0 mmHg. Aortic valve peak gradient measures 8.0 mmHg. Aortic valve area, by VTI measures 3.97 cm. Pulmonic Valve: The pulmonic valve was normal in structure. Pulmonic valve regurgitation is not visualized. No evidence of pulmonic stenosis. Aorta: The aortic root is normal in size and structure. Venous: The inferior vena cava is normal in size with greater than 50% respiratory variability, suggesting right atrial pressure of 3 mmHg. IAS/Shunts: No atrial level shunt detected by color flow Doppler. Additional Comments: 3D was performed not requiring image post processing on an independent workstation and was indeterminate.  LEFT VENTRICLE PLAX 2D LVIDd:         4.70 cm     Diastology LVIDs:         2.70 cm     LV e' medial:    4.68 cm/s LV PW:         1.55 cm     LV E/e'  medial:  9.4 LV IVS:        1.55 cm     LV e' lateral:   6.53 cm/s LVOT diam:     2.30 cm     LV E/e' lateral: 6.7 LV SV:         92 LV SV Index:   34 LVOT Area:     4.15 cm LV IVRT:       116 msec  LV Volumes (MOD) LV vol d, MOD A2C: 60.3 ml LV vol d, MOD A4C: 77.2 ml LV vol s, MOD A2C: 8.0 ml LV vol s, MOD A4C: 16.2 ml LV SV MOD A2C:     52.3 ml LV SV MOD A4C:     77.2 ml LV SV MOD BP:      55.8 ml RIGHT VENTRICLE RV Basal diam:  5.00 cm RV S prime:     12.20 cm/s TAPSE (M-mode): 1.8 cm LEFT ATRIUM           Index        RIGHT ATRIUM           Index LA Vol (A4C): 39.8 ml 14.89 ml/m  RA Area:     21.50 cm                                    RA Volume:   57.10 ml  21.36 ml/m  AORTIC VALVE                    PULMONIC VALVE AV Area (Vmax):    4.18 cm     PV Vmax:  1.72 m/s AV Area (Vmean):   3.97 cm     PV Vmean:       107.000 cm/s AV Area (VTI):     3.97 cm     PV VTI:         0.254 m AV Vmax:           141.00 cm/s  PV Peak grad:   11.8 mmHg AV Vmean:          94.400 cm/s  PV Mean grad:   6.0 mmHg AV VTI:            0.231 m      RVOT Peak grad: 5 mmHg AV Peak Grad:      8.0 mmHg AV Mean Grad:      4.0 mmHg LVOT Vmax:         142.00 cm/s LVOT Vmean:        90.300 cm/s LVOT VTI:          0.221 m LVOT/AV VTI ratio: 0.96  AORTA Ao Root diam: 3.10 cm Ao Asc diam:  3.30 cm MITRAL VALVE MV Area (PHT): 3.91 cm    SHUNTS MV Area VTI:   5.07 cm    Systemic VTI:  0.22 m MV Peak grad:  2.3 mmHg    Systemic Diam: 2.30 cm MV Mean grad:  1.0 mmHg    Pulmonic VTI:  0.184 m MV Vmax:       0.76 m/s MV Vmean:      56.9 cm/s MV Decel Time: 194 msec MV E velocity: 43.95 cm/s MV A velocity: 54.75 cm/s MV E/A ratio:  0.80 Shaukat Khan Electronically signed by Denyse Bathe Signature Date/Time: 12/10/2024/12:17:16 PM    Final    CT ANGIO HEAD NECK W WO CM Result Date: 12/08/2024 CLINICAL DATA:  Initial evaluation for acute neuro deficit, stroke. EXAM: CT ANGIOGRAPHY HEAD AND NECK WITH AND WITHOUT CONTRAST TECHNIQUE:  Multidetector CT imaging of the head and neck was performed using the standard protocol during bolus administration of intravenous contrast. Multiplanar CT image reconstructions and MIPs were obtained to evaluate the vascular anatomy. Carotid stenosis measurements (when applicable) are obtained utilizing NASCET criteria, using the distal internal carotid diameter as the denominator. RADIATION DOSE REDUCTION: This exam was performed according to the departmental dose-optimization program which includes automated exposure control, adjustment of the mA and/or kV according to patient size and/or use of iterative reconstruction technique. CONTRAST:  75mL OMNIPAQUE  IOHEXOL  350 MG/ML SOLN COMPARISON:  Prior exam from 06/24/2021. FINDINGS: CT HEAD FINDINGS Brain: Cerebral volume within normal limits for patient age. No acute intracranial hemorrhage. No acute large vessel territory infarct. No mass lesion, midline shift, or mass effect. Ventricles are normal in size without hydrocephalus. No extra-axial fluid collection. Vascular: No abnormal hyperdense vessel. Skull: Scalp soft tissues demonstrate no acute abnormality. Calvarium intact. Sinuses/Orbits: Right gaze noted. Remote posttraumatic defect at the left lamina papyracea noted. Scattered mucosal thickening about the ethmoidal air cells and maxillary sinuses. No mastoid effusion. CTA NECK FINDINGS Aortic arch: Examination severely limited by habitus, markedly limiting assessment. Aortic arch within normal limits for caliber with standard branch pattern. Aortic atherosclerosis. No visible stenosis about the origin the great vessels. Right carotid system: Right common and internal carotid arteries are patent. Partial medialization into the retropharyngeal space. No visible dissection. Atheromatous change about the right carotid bulb with approximate 50% stenosis, although evaluation is limited by habitus (series 13, image 186). Left carotid system: Left common and  internal carotid  arteries are grossly patent. Partial medialization into the left retropharyngeal space. No visible dissection. Atheromatous change about the left carotid bulb but with no visible stenosis. Vertebral arteries: Evaluation of the vertebral arteries is severely limited within the neck. Both vertebral arteries grossly patent. No visible stenosis or dissection. Skeleton: No visible discrete or worrisome osseous lesions. Other neck: No other obvious abnormality. Upper chest: No other visible abnormality. Review of the MIP images confirms the above findings CTA HEAD FINDINGS Anterior circulation: Evaluation of the intracranial circulation limited by habitus. Atheromatous change about the carotid siphons bilaterally, left worse than right. Up to moderate stenosis at the left siphon. No significant stenosis visible about the right siphon. A1 segments patent bilaterally. Normal anterior communicating complex. Both ACAs patent without visible stenosis. 5 mm saccular lesion arising from the distal right ACA, consistent with aneurysm (series 13, image 56). No M1 stenosis or occlusion. No visible proximal MCA branch occlusion. Distal MCA branches grossly perfused and symmetric. Posterior circulation: Both V4 segments grossly patent without visible stenosis or other abnormality. Neither PICA well visualized. Basilar patent without visible stenosis. Superior cerebellar arteries patent bilaterally. Left PCA primarily supplied via the basilar. Right PCA supplied via a hypoplastic right P1 segment and prominent right posterior communicating artery. PCAs grossly patent to their distal aspects without visible stenosis. Venous sinuses: Grossly patent allowing for timing the contrast bolus. Anatomic variants: None significant. Review of the MIP images confirms the above findings IMPRESSION: 1. Markedly limited exam due to habitus. 2. Grossly negative CTA for large vessel occlusion or other emergent finding. 3. Atheromatous  change about the right carotid bulb with approximate 50% stenosis, difficult to assess accurately given limitations of this exam. 4. Atheromatous change about the left carotid siphon with up to moderate stenosis. 5. 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 6. No other acute intracranial abnormality. Aortic Atherosclerosis (ICD10-I70.0). Electronically Signed   By: Morene Hoard M.D.   On: 12/08/2024 21:03   DG Chest Port 1 View Result Date: 12/08/2024 CLINICAL DATA:  10026 Shortness of breath 10026 EXAM: PORTABLE CHEST - 1 VIEW COMPARISON:  11/01/2015 FINDINGS: Central pulmonary vascular congestion. No focal airspace consolidation, pleural effusion, or pneumothorax. Moderate cardiomegaly. Tortuous aorta with aortic atherosclerosis. No acute fracture or destructive lesions. Multilevel thoracic osteophytosis. IMPRESSION: Moderate cardiomegaly with central pulmonary vascular congestion. No overt pulmonary edema. Electronically Signed   By: Rogelia Myers M.D.   On: 12/08/2024 12:17    Microbiology: Results for orders placed or performed during the hospital encounter of 12/08/24  Resp panel by RT-PCR (RSV, Flu A&B, Covid) Anterior Nasal Swab     Status: None   Collection Time: 12/08/24 11:52 AM   Specimen: Anterior Nasal Swab  Result Value Ref Range Status   SARS Coronavirus 2 by RT PCR NEGATIVE NEGATIVE Final    Comment: (NOTE) SARS-CoV-2 target nucleic acids are NOT DETECTED.  The SARS-CoV-2 RNA is generally detectable in upper respiratory specimens during the acute phase of infection. The lowest concentration of SARS-CoV-2 viral copies this assay can detect is 138 copies/mL. A negative result does not preclude SARS-Cov-2 infection and should not be used as the sole basis for treatment or other patient management decisions. A negative result may occur with  improper specimen collection/handling, submission of specimen other than nasopharyngeal swab, presence of viral  mutation(s) within the areas targeted by this assay, and inadequate number of viral copies(<138 copies/mL). A negative result must be combined with clinical observations, patient history, and epidemiological  information. The expected result is Negative.  Fact Sheet for Patients:  bloggercourse.com  Fact Sheet for Healthcare Providers:  seriousbroker.it  This test is no t yet approved or cleared by the [place]  FDA and  has been authorized for detection and/or diagnosis of SARS-CoV-2 by FDA under an Emergency Use Authorization (EUA). This EUA will remain  in effect (meaning this test can be used) for the duration of the COVID-19 declaration under Section 564(b)(1) of the Act, 21 U.S.C.section 360bbb-3(b)(1), unless the authorization is terminated  or revoked sooner.       Influenza A by PCR NEGATIVE NEGATIVE Final   Influenza B by PCR NEGATIVE NEGATIVE Final    Comment: (NOTE) The Xpert Xpress SARS-CoV-2/FLU/RSV plus assay is intended as an aid in the diagnosis of influenza from Nasopharyngeal swab specimens and should not be used as a sole basis for treatment. Nasal washings and aspirates are unacceptable for Xpert Xpress SARS-CoV-2/FLU/RSV testing.  Fact Sheet for Patients: bloggercourse.com  Fact Sheet for Healthcare Providers: seriousbroker.it  This test is not yet approved or cleared by the [place]  FDA and has been authorized for detection and/or diagnosis of SARS-CoV-2 by FDA under an Emergency Use Authorization (EUA). This EUA will remain in effect (meaning this test can be used) for the duration of the COVID-19 declaration under Section 564(b)(1) of the Act, 21 U.S.C. section 360bbb-3(b)(1), unless the authorization is terminated or revoked.     Resp Syncytial Virus by PCR NEGATIVE NEGATIVE Final    Comment: (NOTE) Fact Sheet for  Patients: bloggercourse.com  Fact Sheet for Healthcare Providers: seriousbroker.it  This test is not yet approved or cleared by the [place]  FDA and has been authorized for detection and/or diagnosis of SARS-CoV-2 by FDA under an Emergency Use Authorization (EUA). This EUA will remain in effect (meaning this test can be used) for the duration of the COVID-19 declaration under Section 564(b)(1) of the Act, 21 U.S.C. section 360bbb-3(b)(1), unless the authorization is terminated or revoked.  Performed at [facility], [address], [place], [place] [address]   Urine Culture (for pregnant, neutropenic or urologic patients or patients with an indwelling urinary catheter)     Status: Abnormal   Collection Time: 12/14/24  3:33 PM   Specimen: Urine, Clean Catch  Result Value Ref Range Status   Specimen Description   Final    URINE, CLEAN CATCH Performed at [facility], [address] [place], [place] [address]    Special Requests   Final    NONE Performed at [facility], [address] [place], [place] [address]    Culture 30,000 COLONIES/mL ESCHERICHIA COLI (A)  Final   Report Status 12/16/2024 FINAL  Final   Organism ID, Bacteria ESCHERICHIA COLI (A)  Final      Susceptibility   Escherichia coli - MIC*    AMPICILLIN <=2 SENSITIVE Sensitive     CEFAZOLIN (URINE) Value in next row Sensitive      <=1 SENSITIVEThis is a modified FDA-approved test that has been validated and its performance characteristics determined by the reporting laboratory.  This laboratory is certified under the Clinical Laboratory Improvement Amendments CLIA as qualified to perform high complexity clinical laboratory testing.    CEFEPIME Value in next row Sensitive      <=1 SENSITIVEThis is a modified FDA-approved test that has been validated and its performance characteristics determined by the reporting laboratory.  This  laboratory is certified under the Clinical Laboratory Improvement Amendments CLIA as qualified  to perform high complexity clinical laboratory testing.    ERTAPENEM Value in next row Sensitive      <=1 SENSITIVEThis is a modified FDA-approved test that has been validated and its performance characteristics determined by the reporting laboratory.  This laboratory is certified under the Clinical Laboratory Improvement Amendments CLIA as qualified to perform high complexity clinical laboratory testing.    CEFTRIAXONE  Value in next row Sensitive      <=1 SENSITIVEThis is a modified FDA-approved test that has been validated and its performance characteristics determined by the reporting laboratory.  This laboratory is certified under the Clinical Laboratory Improvement Amendments CLIA as qualified to perform high complexity clinical laboratory testing.    CIPROFLOXACIN Value in next row Sensitive      <=1 SENSITIVEThis is a modified FDA-approved test that has been validated and its performance characteristics determined by the reporting laboratory.  This laboratory is certified under the Clinical Laboratory Improvement Amendments CLIA as qualified to perform high complexity clinical laboratory testing.    GENTAMICIN Value in next row Sensitive      <=1 SENSITIVEThis is a modified FDA-approved test that has been validated and its performance characteristics determined by the reporting laboratory.  This laboratory is certified under the Clinical Laboratory Improvement Amendments CLIA as qualified to perform high complexity clinical laboratory testing.    NITROFURANTOIN  Value in next row Sensitive      <=1 SENSITIVEThis is a modified FDA-approved test that has been validated and its performance characteristics determined by the reporting laboratory.  This laboratory is certified under the Clinical Laboratory Improvement Amendments CLIA as qualified to perform high complexity clinical laboratory testing.     TRIMETH/SULFA Value in next row Sensitive      <=1 SENSITIVEThis is a modified FDA-approved test that has been validated and its performance characteristics determined by the reporting laboratory.  This laboratory is certified under the Clinical Laboratory Improvement Amendments CLIA as qualified to perform high complexity clinical laboratory testing.    AMPICILLIN/SULBACTAM Value in next row Sensitive      <=1 SENSITIVEThis is a modified FDA-approved test that has been validated and its performance characteristics determined by the reporting laboratory.  This laboratory is certified under the Clinical Laboratory Improvement Amendments CLIA as qualified to perform high complexity clinical laboratory testing.    PIP/TAZO Value in next row Sensitive      <=4 SENSITIVEThis is a modified FDA-approved test that has been validated and its performance characteristics determined by the reporting laboratory.  This laboratory is certified under the Clinical Laboratory Improvement Amendments CLIA as qualified to perform high complexity clinical laboratory testing.    MEROPENEM Value in next row Sensitive      <=4 SENSITIVEThis is a modified FDA-approved test that has been validated and its performance characteristics determined by the reporting laboratory.  This laboratory is certified under the Clinical Laboratory Improvement Amendments CLIA as qualified to perform high complexity clinical laboratory testing.    * 30,000 COLONIES/mL ESCHERICHIA COLI    Labs: CBC: Recent Labs  Lab 12/12/24 0409 12/13/24 0345  WBC 10.2 10.2  HGB 11.8* 11.6*  HCT 37.3 37.2  MCV 100.8* 100.3*  PLT 203 223   Basic Metabolic Panel: Recent Labs  Lab 12/10/24 0338 12/11/24 0409 12/12/24 0409 12/13/24 0345 12/14/24 0346 12/15/24 0349 12/16/24 0626  NA 139 140 136 137 134* 135 138  K 3.9 4.3 4.2 4.2 4.4 4.3 4.0  CL 95* 97* 93* 93* 92* 92* 96*  CO2 33* 35* 32 34*  30 32 35*  GLUCOSE 98 89 101* 101* 84 88 101*  BUN 17  21 24* 31* 37* 28* 17  CREATININE 0.92 1.08* 1.14* 1.65* 1.84* 1.17* 0.85  CALCIUM  9.4 9.1 8.5* 9.3 9.0 9.2 9.4  MG 1.6* 2.0 2.0  --  2.1 2.0  --    Liver Function Tests: No results for input(s): AST, ALT, ALKPHOS, BILITOT, PROT, ALBUMIN  in the last 168 hours. CBG: Recent Labs  Lab 12/10/24 0807 12/11/24 0823 12/12/24 0832 12/13/24 0818 12/16/24 0843  GLUCAP 101* 96 90 127* 97    Discharge time spent: greater than 30 minutes.  Signed: Laree Lock, MD [facility] 12/16/2024 "

## 2024-12-16 NOTE — TOC Transition Note (Signed)
 Transition of Care [facility]) - Discharge Note   Patient Details  Name: [name]. [place] MRN: [id number] Date of Birth: 05-02-1961  [facility] CM/SW Contact:  Nathanael CHRISTELLA Ring, RN Phone Number: 12/16/2024, 4:37 PM   Clinical Narrative:    Patient is medically ready to discharge home.  She will have home health with [facility] aware of DC.  She needs transport home, [facility] arranged, she is 6th in line so could be late tonight.    Final next level of care: Home w Home Health Services Barriers to Discharge: Barriers Resolved   Patient Goals and CMS Choice Patient states their goals for this hospitalization and ongoing recovery are:: Get better.          Discharge Placement                Patient to be transferred to facility by: Transferred home by [facility] Name of family member notified: self Patient and family notified of of transfer: 12/16/24  Discharge Plan and Services Additional resources added to the After Visit Summary for     Discharge Planning Services: CM Consult                      HH Arranged: RN, PT, OT, Nurse's Aide HH Agency: [facility] Date [facility] Agency Contacted: 12/16/24 Time HH Agency Contacted: [phone] Representative spoke with at [facility] Agency: [facility]  Social Drivers of Health (SDOH) Interventions SDOH Screenings   Food Insecurity: Food Insecurity Present (12/09/2024)  Housing: Low Risk (12/09/2024)  Transportation Needs: Unmet Transportation Needs (12/09/2024)  Utilities: Not At Risk (12/09/2024)  Financial Resource Strain: Patient Declined (06/10/2023)   Received from [facility]  Social Connections: Socially Isolated (12/09/2024)  Tobacco Use: Medium Risk (12/15/2024)     Readmission Risk Interventions     No data to display

## 2024-12-16 NOTE — Progress Notes (Addendum)
 Patient and daughter educated on discharge packet and instructions at time of discharge. Patient and daughter verbalized understanding. IV and telemetry removed at time of discharge. Patient belongings sent home with daughter. Patient discharged to [facility] to be transported home.

## 2024-12-16 NOTE — Plan of Care (Signed)
" °  Problem: Education: Goal: Knowledge of General Education information will improve Description: Including pain rating scale, medication(s)/side effects and non-pharmacologic comfort measures Outcome: Progressing   Problem: Education: Goal: Knowledge of General Education information will improve Description: Including pain rating scale, medication(s)/side effects and non-pharmacologic comfort measures Outcome: Progressing   Problem: Education: Goal: Knowledge of General Education information will improve Description: Including pain rating scale, medication(s)/side effects and non-pharmacologic comfort measures Outcome: Progressing   Problem: Clinical Measurements: Goal: Ability to maintain clinical measurements within normal limits will improve Outcome: Progressing   Problem: Activity: Goal: Risk for activity intolerance will decrease Outcome: Progressing   Problem: Nutrition: Goal: Adequate nutrition will be maintained Outcome: Progressing   Problem: Safety: Goal: Ability to remain free from injury will improve Outcome: Progressing   "

## 2024-12-16 NOTE — Progress Notes (Addendum)
 OT Cancellation Note  Patient Details Name: [name] MRN: [id number] DOB: 22-Feb-1961   Cancelled Treatment:    Reason Eval/Treat Not Completed: Fatigue/lethargy limiting ability to participate. Pt received in side-lying, reporting fatigue as she just was up to [facility]. Pt agreeable to rest and participation in OT after lunch. Pt will continue to benefit from [facility] OT and wheelchair at discharge, has all other necessary DME.    Addendum: Pt pending US  to r/o LLE DVT. OT will follow and see as medically appropriate.   Devoiry Corriher L. Jennice Renegar, OTR/L  12/16/2024, 12:12 PM

## 2024-12-16 NOTE — Plan of Care (Signed)
